# Patient Record
Sex: Female | Born: 1965 | Race: White | Hispanic: No | Marital: Married | Smoking: Never smoker
Health system: Southern US, Community
[De-identification: ages and names within clinical notes are randomized; demographics above are authoritative.]

## PROBLEM LIST (undated history)

## (undated) DIAGNOSIS — K869 Disease of pancreas, unspecified: Secondary | ICD-10-CM

## (undated) DIAGNOSIS — E041 Nontoxic single thyroid nodule: Secondary | ICD-10-CM

## (undated) DIAGNOSIS — T7840XA Allergy, unspecified, initial encounter: Secondary | ICD-10-CM

## (undated) DIAGNOSIS — I499 Cardiac arrhythmia, unspecified: Secondary | ICD-10-CM

## (undated) DIAGNOSIS — D649 Anemia, unspecified: Secondary | ICD-10-CM

## (undated) DIAGNOSIS — E785 Hyperlipidemia, unspecified: Secondary | ICD-10-CM

## (undated) DIAGNOSIS — D759 Disease of blood and blood-forming organs, unspecified: Secondary | ICD-10-CM

## (undated) HISTORY — DX: Allergy, unspecified, initial encounter: T78.40XA

## (undated) HISTORY — PX: DILATION AND CURETTAGE OF UTERUS: SHX78

## (undated) HISTORY — DX: Hyperlipidemia, unspecified: E78.5

## (undated) HISTORY — PX: WISDOM TOOTH EXTRACTION: SHX21

## (undated) HISTORY — DX: Cardiac arrhythmia, unspecified: I49.9

## (undated) HISTORY — DX: Disease of pancreas, unspecified: K86.9

## (undated) HISTORY — DX: Nontoxic single thyroid nodule: E04.1

## (undated) HISTORY — PX: COLONOSCOPY: SHX174

---

## 1997-09-14 ENCOUNTER — Encounter: Admission: RE | Admit: 1997-09-14 | Discharge: 1997-12-13 | Payer: Self-pay | Admitting: Gynecology

## 1997-10-20 ENCOUNTER — Inpatient Hospital Stay (HOSPITAL_COMMUNITY): Admission: AD | Admit: 1997-10-20 | Discharge: 1997-10-20 | Payer: Self-pay | Admitting: Obstetrics and Gynecology

## 1997-11-05 ENCOUNTER — Inpatient Hospital Stay (HOSPITAL_COMMUNITY): Admission: AD | Admit: 1997-11-05 | Discharge: 1997-11-09 | Payer: Self-pay | Admitting: Obstetrics and Gynecology

## 1997-11-15 ENCOUNTER — Encounter (HOSPITAL_COMMUNITY): Admission: RE | Admit: 1997-11-15 | Discharge: 1998-02-13 | Payer: Self-pay | Admitting: Obstetrics and Gynecology

## 1997-12-12 ENCOUNTER — Other Ambulatory Visit: Admission: RE | Admit: 1997-12-12 | Discharge: 1997-12-12 | Payer: Self-pay | Admitting: Obstetrics and Gynecology

## 1998-11-14 ENCOUNTER — Ambulatory Visit (HOSPITAL_BASED_OUTPATIENT_CLINIC_OR_DEPARTMENT_OTHER): Admission: RE | Admit: 1998-11-14 | Discharge: 1998-11-14 | Payer: Self-pay | Admitting: General Surgery

## 1998-12-19 ENCOUNTER — Encounter (INDEPENDENT_AMBULATORY_CARE_PROVIDER_SITE_OTHER): Payer: Self-pay | Admitting: Specialist

## 1998-12-19 ENCOUNTER — Ambulatory Visit (HOSPITAL_COMMUNITY): Admission: RE | Admit: 1998-12-19 | Discharge: 1998-12-19 | Payer: Self-pay | Admitting: Gynecology

## 1998-12-29 ENCOUNTER — Other Ambulatory Visit: Admission: RE | Admit: 1998-12-29 | Discharge: 1998-12-29 | Payer: Self-pay | Admitting: Obstetrics and Gynecology

## 1999-01-22 ENCOUNTER — Encounter: Payer: Self-pay | Admitting: Obstetrics and Gynecology

## 1999-01-22 ENCOUNTER — Ambulatory Visit (HOSPITAL_COMMUNITY): Admission: RE | Admit: 1999-01-22 | Discharge: 1999-01-22 | Payer: Self-pay | Admitting: Obstetrics and Gynecology

## 1999-05-30 ENCOUNTER — Other Ambulatory Visit: Admission: RE | Admit: 1999-05-30 | Discharge: 1999-05-30 | Payer: Self-pay | Admitting: Gynecology

## 1999-12-14 ENCOUNTER — Inpatient Hospital Stay (HOSPITAL_COMMUNITY): Admission: AD | Admit: 1999-12-14 | Discharge: 1999-12-17 | Payer: Self-pay | Admitting: Gynecology

## 2000-01-18 ENCOUNTER — Other Ambulatory Visit: Admission: RE | Admit: 2000-01-18 | Discharge: 2000-01-18 | Payer: Self-pay | Admitting: Gynecology

## 2001-01-22 ENCOUNTER — Other Ambulatory Visit: Admission: RE | Admit: 2001-01-22 | Discharge: 2001-01-22 | Payer: Self-pay | Admitting: Gynecology

## 2002-03-03 ENCOUNTER — Other Ambulatory Visit: Admission: RE | Admit: 2002-03-03 | Discharge: 2002-03-03 | Payer: Self-pay | Admitting: Gynecology

## 2002-09-04 ENCOUNTER — Inpatient Hospital Stay (HOSPITAL_COMMUNITY): Admission: AD | Admit: 2002-09-04 | Discharge: 2002-09-04 | Payer: Self-pay | Admitting: Gynecology

## 2002-09-22 ENCOUNTER — Inpatient Hospital Stay (HOSPITAL_COMMUNITY): Admission: AD | Admit: 2002-09-22 | Discharge: 2002-09-24 | Payer: Self-pay | Admitting: Gynecology

## 2002-11-10 ENCOUNTER — Other Ambulatory Visit: Admission: RE | Admit: 2002-11-10 | Discharge: 2002-11-10 | Payer: Self-pay | Admitting: Gynecology

## 2003-03-21 ENCOUNTER — Emergency Department (HOSPITAL_COMMUNITY): Admission: AD | Admit: 2003-03-21 | Discharge: 2003-03-21 | Payer: Self-pay | Admitting: Family Medicine

## 2003-05-13 ENCOUNTER — Emergency Department (HOSPITAL_COMMUNITY): Admission: EM | Admit: 2003-05-13 | Discharge: 2003-05-13 | Payer: Self-pay | Admitting: Emergency Medicine

## 2003-05-20 ENCOUNTER — Ambulatory Visit (HOSPITAL_COMMUNITY): Admission: RE | Admit: 2003-05-20 | Discharge: 2003-05-20 | Payer: Self-pay | Admitting: *Deleted

## 2003-11-23 ENCOUNTER — Other Ambulatory Visit: Admission: RE | Admit: 2003-11-23 | Discharge: 2003-11-23 | Payer: Self-pay | Admitting: Gynecology

## 2004-08-25 ENCOUNTER — Emergency Department (HOSPITAL_COMMUNITY): Admission: EM | Admit: 2004-08-25 | Discharge: 2004-08-25 | Payer: Self-pay | Admitting: Family Medicine

## 2004-09-13 ENCOUNTER — Encounter: Admission: RE | Admit: 2004-09-13 | Discharge: 2004-09-13 | Payer: Self-pay | Admitting: Gynecology

## 2004-11-23 ENCOUNTER — Other Ambulatory Visit: Admission: RE | Admit: 2004-11-23 | Discharge: 2004-11-23 | Payer: Self-pay | Admitting: Gynecology

## 2005-10-24 ENCOUNTER — Encounter: Admission: RE | Admit: 2005-10-24 | Discharge: 2005-10-24 | Payer: Self-pay | Admitting: Gynecology

## 2005-11-25 ENCOUNTER — Other Ambulatory Visit: Admission: RE | Admit: 2005-11-25 | Discharge: 2005-11-25 | Payer: Self-pay | Admitting: Gynecology

## 2006-04-25 ENCOUNTER — Other Ambulatory Visit: Admission: RE | Admit: 2006-04-25 | Discharge: 2006-04-25 | Payer: Self-pay | Admitting: Gynecology

## 2006-10-28 ENCOUNTER — Encounter: Admission: RE | Admit: 2006-10-28 | Discharge: 2006-10-28 | Payer: Self-pay | Admitting: Gynecology

## 2006-11-27 ENCOUNTER — Other Ambulatory Visit: Admission: RE | Admit: 2006-11-27 | Discharge: 2006-11-27 | Payer: Self-pay | Admitting: Gynecology

## 2007-10-29 ENCOUNTER — Encounter: Admission: RE | Admit: 2007-10-29 | Discharge: 2007-10-29 | Payer: Self-pay | Admitting: Gynecology

## 2008-04-27 ENCOUNTER — Encounter: Admission: RE | Admit: 2008-04-27 | Discharge: 2008-04-27 | Payer: Self-pay | Admitting: Family Medicine

## 2008-11-28 ENCOUNTER — Encounter: Admission: RE | Admit: 2008-11-28 | Discharge: 2008-11-28 | Payer: Self-pay | Admitting: Gynecology

## 2010-04-12 ENCOUNTER — Ambulatory Visit: Payer: Self-pay | Admitting: Family Medicine

## 2010-05-07 ENCOUNTER — Ambulatory Visit (INDEPENDENT_AMBULATORY_CARE_PROVIDER_SITE_OTHER): Payer: BC Managed Care – PPO | Admitting: Family Medicine

## 2010-05-07 DIAGNOSIS — R5381 Other malaise: Secondary | ICD-10-CM

## 2010-05-07 DIAGNOSIS — Z0000 Encounter for general adult medical examination without abnormal findings: Secondary | ICD-10-CM

## 2010-06-29 NOTE — Discharge Summary (Signed)
   NAME:  Deborah Meadows, LINE                          ACCOUNT NO.:  [id number]   MEDICAL RECORD NO.:  [id number]                   PATIENT TYPE:  INP   LOCATION:  9129                                 FACILITY:  [facility]   PHYSICIAN:  Juan H. Lily Peer, M.D.             DATE OF BIRTH:  December 06, 1965   DATE OF ADMISSION:  09/22/2002  DATE OF DISCHARGE:  09/24/2002                                 DISCHARGE SUMMARY   DISCHARGE DIAGNOSES:  1. Intrauterine pregnancy at 39 weeks, delivered.  2. History of prior cesarean section followed by successful vaginal birth     after cesarean section.  3. Status post vacuum-assisted vaginal delivery.  4. Positive group B strep.   HISTORY:  This is a 36-years-of-age female gravida 6 para 2 aborta 3 with an  EDC of September 26, 2002.  Prenatal course had been complicated by advanced  maternal age.  The patient declined amniocentesis.  Also with positive group  B strep, history of prior cesarean section; however, followed by successful  vaginal birth after cesarean section.   HOSPITAL COURSE:  On September 22, 2002 the patient presented at 39+ weeks with  rupture of membranes, was begun on IV clindamycin.  Subsequently, on September 22, 2002 the patient underwent a vacuum-assisted vaginal delivery secondary  to poor maternal pushing efforts/anxiety and underwent the delivery of female,  Apgars of 9 and 9, weight of 7 pounds 14 ounces.  There was a midline  episiotomy, third degree vaginal/perineal tear which was repaired.  It was a  successful vaginal birth after cesarean section.  Postpartum the patient  remained afebrile, voiding, in stable condition.  She was discharged to home  on September 24, 2002 in satisfactory condition and given [facility]  postpartum instructions/postpartum booklet.   ACCESSORY CLINICAL FINDINGS/LABORATORY DATA:  The patient is O positive,  rubella immune.  On September 23, 2002 hemoglobin was 9.9.   DISPOSITION:  The patient was  discharged to home.  Informed to return to  office in six weeks.  If had any problems prior to that time to be seen in  the office.  Given Motrin p.r.n. pain.     Susa Loffler, P.A.                    Juan H. Lily Peer, M.D.    TSG/MEDQ  D:  10/22/2002  T:  10/22/2002  Job:  [id number]

## 2010-06-29 NOTE — Op Note (Signed)
[facility]  Patient:    Deborah Meadows                        MRN: [id number] Proc. Date: 12/19/98 Adm. Date:  [id number] Attending:  Tonye Royalty                           Operative Report  PREOPERATIVE DIAGNOSIS:       First trimester missed abortion.  Recurrent first  trimester pregnancy losses.  POSTOPERATIVE DIAGNOSIS:      First trimester missed abortion.  Recurrent first  trimester pregnancy losses.  OPERATION:                    First trimester D&E.  SURGEON:                      Juan H. Lily Peer, M.D.  ASSISTANT:  ANESTHESIA:                   MAC and paracervical block with 2% Xylocaine with  1:100,000 epinephrine.  ESTIMATED BLOOD LOSS:  INDICATIONS:                  A 45 year old with first trimester missed abortion.  DESCRIPTION OF PROCEDURE:     After the patient was adequately counseled, she was taken to the operating room where after MAC anesthesia was placed, she was placed in the low lithotomy position.  The vagina and perineum were prepped and draped in the usual sterile fashion.  A red rubber Roxan Hockey was utilized to empty the bladder contents for approximately 50 cc.  An intervaginal speculum was inserted for visualization of the cervix.  After this, 2% Xylocaine with 1:100,000 epinephrine was infiltrated into the cervical stroma at the 2, 4, 8, and 10 position for approximately 10 cc.  The cervix required minimal dilatation and an 8 mm curet as introduced into the intrauterine cavity.  Of note, a single tooth tenaculum was  placed on the anterior cervical lip for traction during the D&E.  A moderate amount of tissue was obtained.  The suction curet was interchanged with a blunt curet o completely evacuate the intrauterine cavity.  An alloquot of the products of conception was submitted for chromosomal studies and the remainder to the pathologist here at [facility].  The patient  was given 10 units of Pitocin in 500 cc of Ringers lactate.  She did receive a gram of Ancef prophylactically and 30 mg of Toradol were given IV en route to the recovery room. The single tooth tenaculum was removed.  The patient tolerated the procedure well. Estimated blood loss was less than 75 cc and IV fluid was 800 cc and the patients blood type is O positive. DD:  12/19/98 TD:  12/20/98 Job: [id number]

## 2010-06-29 NOTE — Discharge Summary (Signed)
[facility] [place]  Patient:    Deborah Meadows, Deborah Meadows                       MRN: [id number] Adm. Date:  [id number] Disc. Date: [id number] Attending:  Douglass Rivers Dictator:   Antony Contras, [facility]                           Discharge Summary  DISCHARGE DIAGNOSES:          1. Intrauterine pregnancy at term.                               2. History of previous low cervical transverse                                  cesarean section.                               3. Positive group B strep status.  PROCEDURES:                   Mityvac-assisted vaginal delivery of viable                               infant over midline episiotomy with third                               degree laceration.  HISTORY OF PRESENT ILLNESS:   The patient is a 45 year old, gravida 5, para 1-0-3-1, with an LMP of March 09, 1999, [facility] December 14, 1999.  Prenatal risk factors included advanced maternal age, history of previous cesarean section, history of SABs x 3.  PRENATAL LABORATORY DATA:     Blood type O positive, antibody screen negative. RPR, HBsAg, HIV nonreactive.  Rubella immune.  MS-AFP within normal limits. GBS is positive.  HOSPITAL COURSE:              The patient had elected a trial of labor and was admitted on December 14, 1999, with a history of regular contractions and was found to be at complete dilatation with a +2 to +3 station.  She was delivered per Mityvac assistance over a midline episiotomy with third degree laceration, Apgars 8/9, female infant weighing 8 pounds 1 ounce.  Postpartum course was uncomplicated.  She remained afebrile.  She had no difficulty voiding and was able to discharge on her second postpartum day in satisfactory condition.  LABORATORY DATA:              CBC:  Hematocrit was 25.0, hemoglobin 8.7, WBC 11.6, and platelets 172.  DISPOSITION/FOLLOWUP:         Follow up in six weeks.  DISCHARGE MEDICATIONS:        Continue prenatal vitamins and  iron.  Motrin and Tylox for pain. DD:  01/07/00 TD:  01/07/00 Job: [id number]

## 2010-06-29 NOTE — H&P (Signed)
[facility] [place]  Patient:    Deborah Meadows, Deborah Meadows                       MRN: [id number] Adm. Date:  [id number] Attending:  Douglass Rivers                         History and Physical  CHIEF COMPLAINT:              Labor, term pregnancy.  HISTORY OF PRESENT ILLNESS:   This 45 year old G 5, P 1, AB 3 female at term gestation with a gross rupture of membranes and regular uterine contractions, admitted at 4.0 cm dilatation.  The patient has a history of a prior low transverse cervical cesarean section, and has been counseled for a trial of labor, and elects for a trial of labor, accepting the risks.  PRENATAL COURSE:              Noted for positive beta Streptococcus carrier.  PAST MEDICAL HISTORY:         None.  PAST SURGICAL HISTORY:        Cesarean section, low transverse cervical.  ALLERGIES:                    PENICILLIN.  REVIEW OF SYSTEMS:            Noncontributory.  FAMILY HISTORY:               Noncontributory.  SOCIAL HISTORY:               No cigarette or alcohol use.  PHYSICAL EXAMINATION:  VITAL SIGNS:                  Afebrile, vital signs are stable.  HEENT:                        Normal.  LUNGS:                        Clear.  CARDIAC:                      A regular rate, no rubs, murmurs, or gallops.  ABDOMEN:                      Benign, noting a gravid vertex fetus.  Reactive fetal tracing.  Contractions every two minutes.  PELVIC:                       Cervix complete, complete, +2 to +3 station.  ASSESSMENT:                   A 45 year old gravida 5, para 1, abortion 3,                               female with term gestation, admitted in labor,                               progressed without difficulty, now complete,                               complete, with +2 to +  3 station, with a                               reactive fetal tracing, regular contractions                               every two minutes.  PLAN:                          The patient is a beta Streptococcus positive carrier, and has received clindamycin due to her allergy to PENICILLIN. DD:  12/15/99 TD:  12/16/99 Job: [id number]

## 2010-10-29 ENCOUNTER — Encounter: Payer: Self-pay | Admitting: Medical

## 2010-10-29 ENCOUNTER — Ambulatory Visit (INDEPENDENT_AMBULATORY_CARE_PROVIDER_SITE_OTHER): Payer: BC Managed Care – PPO | Admitting: Medical

## 2010-10-29 VITALS — BP 118/80 | HR 78 | Temp 98.3°F | Resp 18 | Ht 60.0 in | Wt 123.0 lb

## 2010-10-29 DIAGNOSIS — M7989 Other specified soft tissue disorders: Secondary | ICD-10-CM

## 2010-10-29 DIAGNOSIS — M79644 Pain in right finger(s): Secondary | ICD-10-CM

## 2010-10-29 DIAGNOSIS — M79609 Pain in unspecified limb: Secondary | ICD-10-CM

## 2010-10-29 LAB — CBC
HCT: 36.6 % (ref 36.0–46.0)
Hemoglobin: 12.4 g/dL (ref 12.0–15.0)
MCV: 96.6 fL (ref 78.0–100.0)
RDW: 12.4 % (ref 11.5–15.5)
WBC: 5.1 10*3/uL (ref 4.0–10.5)

## 2010-10-29 MED ORDER — CEPHALEXIN 500 MG PO CAPS: ORAL_CAPSULE | ORAL | Status: DC

## 2010-10-29 NOTE — Patient Instructions (Signed)
Take 3-4 Ibuprofen OTC by mouth every 6-8 hours for pain and inflammation, elevate the finger/hand, apply ice pack for 15-20 minutes three times daily.  Avoid injuring the finger and rest.    We will check some lab values.    If redness, pain and swelling worsens, then begin Keflex.

## 2010-10-29 NOTE — Progress Notes (Signed)
Subjective:   HPI  Deborah Meadows is a 45 y.o. female who presents for 3-4 day hx/o swollen, painful right thumb.  She denies prior similar episodes.  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  She denies recent injury, denies biting her nails, denies trauma or break in the skin . She notes the pain in her thumb awoke her from sleep Saturday 2 days ago, and it has continued to be warm, swollen, painful, throbbing like it has "temperature" in it.  Denies drainage.  It hurts for even touching another object/brushing against an object.  Denies alcohol use and doesn't eat lots of protein.  No other aggravating or relieving factors.  No other c/o.  The following portions of the patient's history were reviewed and updated as appropriate: allergies, current medications, past family history, past medical history, past social history, past surgical history and problem list.  Past Medical History  Diagnosis Date  . Irregular heart beat     Review of Systems Constitutional: denies fever, chills, sweats Dermatology: denies rash Cardiology: denies chest pain, palpitations, edema Respiratory: denies cough, shortness of breath Gastroenterology: denies abdominal pain, nausea, vomiting, diarrhea, constipation Hematology: denies bleeding or bruising problems Ophthalmology: denies vision changes Urology: denies dysuria, difficulty urinating, hematuria, urinary frequency, urgency Neurology: no weakness, tingling, numbness      Objective:   Physical Exam  General appearance: alert, no distress, WD/WN, white female Skin: warmth of the right thumb volar surface, but no fluctuance, no induration, no obvious felon, no drainage.  Small crusted round 2mm lesion on volar thumb surface at MCP joint Musculoskeletal: right thumb quite tender distal phalanx, generalized erythema, swollen, but normal ROM,  otherwise bilat fingers and hands nontender, no swelling, no obvious deformity Pulses: UE pulses 2+ symmetric, normal cap refill Neurological: normal strength and sensation of fingers and hands  Assessment :    Encounter Diagnoses  Name Primary?  . Pain in finger of right hand Yes  . Swelling of finger      Plan:  Etiology seems to be gout/inflammatory arthritis, but can entirely rule out infection.  Since she has no prior similar hx/o, we will get some labs today to help evaluate for gout vs other, and will have her rest, use ice, Ibuprofen 4 tablets of the 200mg  OTC every 6-8 hours, and avoid injuring the finger.  Gave note for work.    Discussed signs of infection or worsening infection.  If any symptoms suggest worsening infection, she will begin Keflex.  We will hopefully tomorrow with lab results.

## 2010-10-30 ENCOUNTER — Ambulatory Visit (INDEPENDENT_AMBULATORY_CARE_PROVIDER_SITE_OTHER): Payer: BC Managed Care – PPO | Admitting: Medical

## 2010-10-30 ENCOUNTER — Encounter: Payer: Self-pay | Admitting: Medical

## 2010-10-30 ENCOUNTER — Other Ambulatory Visit: Payer: Self-pay | Admitting: Medical

## 2010-10-30 ENCOUNTER — Encounter: Payer: Self-pay | Admitting: Family Medicine

## 2010-10-30 VITALS — BP 120/80 | HR 72 | Temp 98.8°F | Resp 18

## 2010-10-30 DIAGNOSIS — M79646 Pain in unspecified finger(s): Secondary | ICD-10-CM

## 2010-10-30 DIAGNOSIS — M79609 Pain in unspecified limb: Secondary | ICD-10-CM

## 2010-10-30 DIAGNOSIS — IMO0002 Reserved for concepts with insufficient information to code with codable children: Secondary | ICD-10-CM

## 2010-10-30 LAB — SEDIMENTATION RATE

## 2010-10-30 MED ORDER — DOXYCYCLINE HYCLATE 100 MG PO TABS: 100.0000 mg | ORAL_TABLET | Freq: Two times a day (BID) | ORAL | Status: AC

## 2010-10-30 MED ORDER — OXYCODONE-ACETAMINOPHEN 5-325 MG PO TABS: 1.0000 | ORAL_TABLET | Freq: Four times a day (QID) | ORAL | Status: DC | PRN

## 2010-10-30 NOTE — Progress Notes (Signed)
Subjective:   HPI  Deborah Meadows is a 45 y.o. female who presents for recheck on painful right thumb.   I saw her just yesterday for same problem.  She had been having painful swollen but not red thumb x 3-4 days.  Given her work history and symptoms, exam suggested inflammatory joint with little sign of infection yesterday.  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  She denies recent injury, denies trauma or break in the skin.  She does now recall biting her nails at times.  Since yesterday the pain has gotten unbearable, she sees color changes and possible "fever" and pus in the finger now.   No other aggravating or relieving factors.  No other c/o.  The following portions of the patient's history were reviewed and updated as appropriate: allergies, current medications, past family history, past medical history, past social history, past surgical history and problem list.  Past Medical History  Diagnosis Date  . Irregular heart beat     Review of Systems Constitutional: denies fever, chills, sweats Dermatology: denies rash Cardiology: denies chest pain, palpitations, edema Respiratory: denies cough, shortness of breath Gastroenterology: denies abdominal pain, nausea, vomiting Neurology: no weakness, tingling, numbness      Objective:   Physical Exam  General appearance: alert, no distress, WD/WN, white female Skin: warmth of the right thumb volar surface, area of whitish coloration suggestive of pus pocket on the medial nail bed, otherwise pink to erythematous color.  No induration, no obvious felon, no drainage.   Musculoskeletal: right thumb quite tender distal phalanx, generalized erythema, swollen, but normal ROM, otherwise bilat fingers and hands nontender Pulses: UE pulses 2+ symmetric, normal cap refill Neurological: normal strength and sensation of fingers and  hands  Assessment :    Encounter Diagnoses  Name Primary?  . Paronychia Yes  . Finger pain      Plan:   Dr. Susann Givens, supervising physician, also examined the finger, and we together with patient's consent decided to perform I&D.  Prepped in usual sterile fashion, used digital block for anesthesia with 1% lidocaine without epi, but this didn't seem to help the distal finger.  Thus, local anesthesia of distal thumb also applied.  Used #11 blade to incise with 1-1.5 cc of pus expressed.  Cleaned wound and dressed with sterile dressing.    Discussed wound care, dressing changes BID, can use Percocet prn for pain, begin Doxycycline, and if not improving in 2-3 days, recheck.

## 2010-10-30 NOTE — Patient Instructions (Signed)
Keep cleaned and bandaged until it is healing.  By lunch time tomorrow, begin warm soap and water or warm salt water soaks.  Begin Keflex today.  Use Percocet as needed for pain.  Use Aleve BID for the next few days for pain and inflammation.  Alternate by 4 hours with the Percocet.

## 2010-10-31 ENCOUNTER — Other Ambulatory Visit: Payer: Self-pay | Admitting: *Deleted

## 2010-11-01 ENCOUNTER — Encounter: Payer: Self-pay | Admitting: Family Medicine

## 2010-11-01 ENCOUNTER — Ambulatory Visit (INDEPENDENT_AMBULATORY_CARE_PROVIDER_SITE_OTHER): Payer: BC Managed Care – PPO | Admitting: Family Medicine

## 2010-11-01 VITALS — BP 100/64 | HR 64 | Temp 98.5°F | Ht 63.0 in | Wt 123.0 lb

## 2010-11-01 DIAGNOSIS — IMO0002 Reserved for concepts with insufficient information to code with codable children: Secondary | ICD-10-CM

## 2010-11-01 DIAGNOSIS — Z23 Encounter for immunization: Secondary | ICD-10-CM

## 2010-11-01 NOTE — Patient Instructions (Signed)
Paronychia (Felon, Whitlow) Paronychia is an inflammatory reaction involving the folds of the skin surrounding the fingernail. This is commonly caused by an infection in the skin around a nail. The most common cause of paronychia is frequent wetting of the hands (as seen with bartenders, food servers, nurses or others who wet their hands). This makes the skin around the fingernail susceptible to infection by bacteria (germs) or fungus. Other predisposing factors are:  Aggressive manicuring.   Nail biting.   Thumbsucking.  The most common cause is a staphylococcal (a type of germ) infection, or a fungal (Candida) infection. When caused by a germ, it usually comes on suddenly with redness, swelling, pus and is often painful. It may get under the nail and form an abscess (collection of pus), or form an abscess around the nail. If the nail itself is infected with a fungus, the treatment is usually prolonged and may require oral medicine for up to one year. Your caregiver will determine the length of time treatment is required. The paronychia caused by bacteria (germs) may largely be avoided by not pulling on hangnails or picking at cuticles. When the infection occurs at the tips of the finger it is called felon. When the cause of paronychia is from the herpes simplex virus (HSV) it is called herpetic whitlow. TREATMENT  When an abscess is present treatment is often incision and drainage. This means that the abscess must be cut open so the pus can get out. When this is done, the following home care instructions should be followed.  HOME CARE INSTRUCTIONS  It is important to keep the affected fingers very dry. Rubber or plastic gloves over cotton gloves should be used whenever the hand must be placed in water.   Keep wound clean, dry and dressed as suggested by your caregiver between warm soaks or warm compresses.   Soak in warm water for fifteen to twenty minutes three to four times per day for  bacterial infections. Fungal infections are very difficult to treat, so often require treatment for long periods of time.   For bacterial (germ) infections take antibiotics (medicine which kill germs) as directed and finish the prescription, even if the problem appears to be solved before the medicine is gone.   Only take over-the-counter or prescription medicines for pain, discomfort, or fever as directed by your caregiver.  SEEK IMMEDIATE MEDICAL CARE IF :  There is redness, swelling, or increasing pain in the wound.   Pus is coming from wound.   An unexplained oral temperature above 101 develops.   You notice a foul smell coming from the wound or dressing.  Document Released: 07/24/2000 Document Re-Released: 04/26/2008 [facility] Patient Information 2011 [place], [place].

## 2010-11-01 NOTE — Progress Notes (Signed)
Patient presents for f/u on paronychia.  She had 1-1.5 cc of pus drained 2 days ago.  Hasn't been doing any warm soaks, no further drainage.  Feels like pus is reaccumulating.  Still in a lot of pain.  Compliant with taking Doxycycline.  Denies fevers.  Presents for repeat I&D of paronychia.  States she didn't get good relief from digital block, prefers just local block with today's procedure  Past Medical History  Diagnosis Date  . Irregular heart beat     Past Surgical History  Procedure Date  . Cesarean section 1999    History   Social History  . Marital Status: Married    Spouse Name: N/A    Number of Children: N/A  . Years of Education: N/A   Occupational History  . Not on file.   Social History Main Topics  . Smoking status: Never Smoker   . Smokeless tobacco: Not on file  . Alcohol Use: 0.5 oz/week    1 drink(s) per week     1 glass per evening.  . Drug Use: No  . Sexually Active: Not on file   Other Topics Concern  . Not on file   Social History Narrative  . No narrative on file   Current outpatient prescriptions:doxycycline (VIBRA-TABS) 100 MG tablet, Take 1 tablet (100 mg total) by mouth 2 (two) times daily., Disp: 20 tablet, Rfl: 0;  oxyCODONE-acetaminophen (ROXICET) 5-325 MG per tablet, Take 1 tablet by mouth every 6 (six) hours as needed for pain., Disp: 20 tablet, Rfl: 0  Allergies  Allergen Reactions  . Penicillins Hives and Itching   ROS: denies fever, nausea, vomiting, rash or other concerns  PHYSICAL EXAM: BP 100/64  Pulse 64  Temp(Src) 98.5 F (36.9 C) (Oral)  Ht 5\' 3"  (1.6 m)  Wt 123 lb (55.792 kg)  BMI 21.79 kg/m2  LMP 10/07/2010  R thumb--ulnar aspect of R thumb very tender, with fluctuant, white appearing area.  Some STS of entire distal thumb.  No erythema/warmth/streaks  Procedure: Area prepped with betadine swabs x 3. Anesthetized with 2% lidocaine without epinephrine.  11 blade was then used to incise paronychia.  Drained  approximately 1/2 cc of purulent fluid.  Patient tolerated procedure well.  ASSESSMENT/PLAN: 1. Paronychia  Incise and drain finger abscess  2. Need for prophylactic vaccination and inoculation against influenza  Flu vaccine greater than or equal to 3yo preservative free IM   Warm soaks. Continue ABX F/u if worsening. Will contact with culture results--still pending

## 2010-11-02 LAB — WOUND CULTURE
Gram Stain: NONE SEEN
Gram Stain: NONE SEEN
Gram Stain: NONE SEEN

## 2010-11-05 ENCOUNTER — Telehealth: Payer: Self-pay | Admitting: Family Medicine

## 2010-11-05 NOTE — Telephone Encounter (Signed)
Spoke with patient, she states that her finger has shown improvement. She has been draining any pus that was left in her wound, but currently there is not any. She states that her finger in tingly, tender and sore from where she was cut to drain but feels that she is definetely improving. Instructed pt to call if anything changes or her finger worsens. Pt verbalized understanding.

## 2010-11-05 NOTE — Telephone Encounter (Signed)
Advise pt--had to be re-cultured (re-inoculated) which caused delay in getting results.  Results show MICROAEROPHILIC STREPTOCOCCI.  It is NOT MRSA (not staph, but strep).  The doxycycline should be treating this.  Ensure that she is having improvement in her finger infection

## 2010-11-07 ENCOUNTER — Telehealth: Payer: Self-pay | Admitting: Family Medicine

## 2010-11-07 NOTE — Telephone Encounter (Signed)
Deborah Meadows wants you to call her and discuss her finger and antibiotics.

## 2010-11-08 NOTE — Telephone Encounter (Signed)
Discussed with patient--she had questions regarding type of bacteria, whether or not it was flesh-eating.  She continued to get purulent drainage after warm soaks for a few days, but no further pus over the last 2 days.  Denies fevers.  Still somewhat tender to touch in area where she was lanced twice, but no increase in swelling or redevelopment of abscess.  She is to complete the doxycycline, and f/u next week if ongoing problems.  All questions were answered

## 2010-11-14 ENCOUNTER — Other Ambulatory Visit: Payer: Self-pay | Admitting: Women's Health

## 2010-11-14 DIAGNOSIS — Z1231 Encounter for screening mammogram for malignant neoplasm of breast: Secondary | ICD-10-CM

## 2010-12-03 ENCOUNTER — Ambulatory Visit
Admission: RE | Admit: 2010-12-03 | Discharge: 2010-12-03 | Disposition: A | Discharge status: Home / Self Care | Payer: BC Managed Care – PPO | Source: Ambulatory Visit | Attending: Women's Health | Admitting: Women's Health

## 2010-12-03 DIAGNOSIS — Z1231 Encounter for screening mammogram for malignant neoplasm of breast: Secondary | ICD-10-CM

## 2011-04-11 ENCOUNTER — Telehealth: Payer: Self-pay | Admitting: *Deleted

## 2011-04-11 ENCOUNTER — Encounter: Payer: Self-pay | Admitting: Family Medicine

## 2011-04-11 ENCOUNTER — Ambulatory Visit (INDEPENDENT_AMBULATORY_CARE_PROVIDER_SITE_OTHER): Payer: BC Managed Care – PPO | Admitting: Family Medicine

## 2011-04-11 VITALS — BP 108/72 | HR 68 | Temp 99.2°F | Ht 63.0 in | Wt 123.0 lb

## 2011-04-11 DIAGNOSIS — J069 Acute upper respiratory infection, unspecified: Secondary | ICD-10-CM

## 2011-04-11 NOTE — Patient Instructions (Signed)
Decongestants (ie sudafed) will help with sinus pressure and pain, and ear discomfort Guaifenesin (ingredient in Mucinex and Robitussin) will help keep the mucus/phlegm thin.  Use this only if having very thick mucus, or a lot of cough from postnasal drip.  If you use the Mucinex D form of mucinex, then do NOT take a separate decongestant (it contains a decongestant). Start doing sinus rinses or using neti-pot.  Use once or twice daily  Call Monday or Tuesday if you are getting worse, for antibiotic prescription

## 2011-04-11 NOTE — Telephone Encounter (Signed)
Not seen since September, for different issue. Needs OV for consideration of ABX. Keep appt

## 2011-04-11 NOTE — Telephone Encounter (Signed)
Called patient and left message for her letting her know that Dr.Knapp would like her to keep appt this afternnon.

## 2011-04-11 NOTE — Telephone Encounter (Signed)
Patient called and wanted to know if you would call something in for her for a sinus infection? I offered her an appt this afternoon, she took the 4pm but still wanted to know if you would call her in a Zpak instead of coming in. Thanks. [employer] [place].

## 2011-04-11 NOTE — Progress Notes (Signed)
Chief complaint: sinus pain/pressure, facial pain, sinus HA, ear pain and teeth even hurt since yesterday am  HPI:  Symptoms began 2-3 days ago.  She was away with a sick friend.  Today if felt like there was a truck on her head.  All of her sinuses hurt, her teeth hurt, her glands hurt and ears hurt.  She hasn't taking any OTC meds. Mucus has been clear sometimes, green sometimes.  Only slight cough.  Past Medical History  Diagnosis Date  . Irregular heart beat    Past Surgical History  Procedure Date  . Cesarean section 1999   No current outpatient prescriptions on file prior to visit.   Allergies  Allergen Reactions  . Penicillins Hives and Itching   ROS: Denies fevers, occasional slight chills.  Denies nausea, vomiting, diarrhea, skin rash, chest pain, shortness of breath  PHYSICAL EXAM: BP 108/72  Pulse 68  Temp(Src) 99.2 F (37.3 C) (Oral)  Ht 5\' 3"  (1.6 m)  Wt 123 lb (55.792 kg)  BMI 21.79 kg/m2  LMP 03/18/2011 Well developed, pleasant female in no distress, sniffing HEENT:  PERRL, EOMI, conjunctiva clear. Nasal mucosa mild-mod edematous, no purulence or erythema. Mildly tender x 4 sinuses, maxillary>frontal.  Tm's and EAC's normal OP clear Neck: no lymphadenopathy, thyromegaly or mass Heart: regular rate and rhythm without murmur Lungs: clear bilaterally Skin: no rash  ASSESSMENT/PLAN:  1. URI (upper respiratory infection)    Decongestants (ie sudafed) will help with sinus pressure and pain, and ear discomfort Guaifenesin (ingredient in Mucinex and Robitussin) will help keep the mucus/phlegm thin.  Use this only if having very thick mucus, or a lot of cough from postnasal drip.  If you use the Mucinex D form of mucinex, then do NOT take a separate decongestant (it contains a decongestant). Start doing sinus rinses or using neti-pot.  Use once or twice daily  Call Monday for antibiotics if worsening symptoms

## 2012-02-26 ENCOUNTER — Other Ambulatory Visit: Payer: Self-pay | Admitting: Family Medicine

## 2012-02-26 DIAGNOSIS — Z1231 Encounter for screening mammogram for malignant neoplasm of breast: Secondary | ICD-10-CM

## 2012-03-13 ENCOUNTER — Encounter: Payer: Self-pay | Admitting: Women's Health

## 2012-03-13 ENCOUNTER — Telehealth: Payer: Self-pay | Admitting: *Deleted

## 2012-03-13 ENCOUNTER — Other Ambulatory Visit (HOSPITAL_COMMUNITY)
Admission: RE | Admit: 2012-03-13 | Discharge: 2012-03-13 | Disposition: A | Discharge status: Home / Self Care | Payer: BC Managed Care – PPO | Source: Ambulatory Visit | Attending: Women's Health | Admitting: Women's Health

## 2012-03-13 ENCOUNTER — Ambulatory Visit (INDEPENDENT_AMBULATORY_CARE_PROVIDER_SITE_OTHER): Payer: BC Managed Care – PPO | Admitting: Women's Health

## 2012-03-13 VITALS — BP 126/76 | Ht 63.0 in | Wt 123.0 lb

## 2012-03-13 DIAGNOSIS — Z01419 Encounter for gynecological examination (general) (routine) without abnormal findings: Secondary | ICD-10-CM

## 2012-03-13 DIAGNOSIS — J329 Chronic sinusitis, unspecified: Secondary | ICD-10-CM | POA: Insufficient documentation

## 2012-03-13 DIAGNOSIS — Z833 Family history of diabetes mellitus: Secondary | ICD-10-CM

## 2012-03-13 DIAGNOSIS — E079 Disorder of thyroid, unspecified: Secondary | ICD-10-CM

## 2012-03-13 DIAGNOSIS — Z1151 Encounter for screening for human papillomavirus (HPV): Secondary | ICD-10-CM | POA: Insufficient documentation

## 2012-03-13 DIAGNOSIS — Z13220 Encounter for screening for lipoid disorders: Secondary | ICD-10-CM

## 2012-03-13 LAB — CBC WITH DIFFERENTIAL/PLATELET
Basophils Absolute: 0 10*3/uL (ref 0.0–0.1)
Basophils Relative: 0 % (ref 0–1)
Lymphocytes Relative: 18 % (ref 12–46)
MCHC: 34.1 g/dL (ref 30.0–36.0)
Monocytes Absolute: 0.6 10*3/uL (ref 0.1–1.0)
Neutro Abs: 3.9 10*3/uL (ref 1.7–7.7)
Neutrophils Relative %: 70 % (ref 43–77)
Platelets: 201 10*3/uL (ref 150–400)
RDW: 12.8 % (ref 11.5–15.5)
WBC: 5.6 10*3/uL (ref 4.0–10.5)

## 2012-03-13 LAB — COMPREHENSIVE METABOLIC PANEL
ALT: 14 U/L (ref 0–35)
Albumin: 4.4 g/dL (ref 3.5–5.2)
Alkaline Phosphatase: 47 U/L (ref 39–117)
CO2: 24 mEq/L (ref 19–32)
Potassium: 4.4 mEq/L (ref 3.5–5.3)
Sodium: 134 mEq/L — ABNORMAL LOW (ref 135–145)
Total Bilirubin: 0.8 mg/dL (ref 0.3–1.2)
Total Protein: 6.7 g/dL (ref 6.0–8.3)

## 2012-03-13 LAB — LIPID PANEL
Cholesterol: 218 mg/dL — ABNORMAL HIGH (ref 0–200)
Triglycerides: 58 mg/dL (ref <150)
VLDL: 12 mg/dL (ref 0–40)

## 2012-03-13 LAB — URINALYSIS W MICROSCOPIC + REFLEX CULTURE
Bilirubin Urine: NEGATIVE
Ketones, ur: NEGATIVE mg/dL
Nitrite: NEGATIVE
Specific Gravity, Urine: 1.01 (ref 1.005–1.030)
Urobilinogen, UA: 0.2 mg/dL (ref 0.0–1.0)
pH: 6 (ref 5.0–8.0)

## 2012-03-13 LAB — TSH: TSH: 2.808 u[IU]/mL (ref 0.350–4.500)

## 2012-03-13 MED ORDER — AZITHROMYCIN 250 MG PO TABS: 250.0000 mg | ORAL_TABLET | Freq: Every day | ORAL | Status: DC

## 2012-03-13 NOTE — Telephone Encounter (Signed)
Yes, take 2 pills first day one/ day there after. Patient was instructed on how to take at office visit also.

## 2012-03-13 NOTE — Addendum Note (Signed)
Addended by: Richardson Chiquito on: 03/13/2012 12:19 PM   Modules accepted: Orders

## 2012-03-13 NOTE — Telephone Encounter (Signed)
Pt informed with the below note. Pt has picked rx up.

## 2012-03-13 NOTE — Telephone Encounter (Signed)
Pharmacy called to clarfiy Rx for z-pack. Pharmacy said that directions are typically 2 pill first day then once daily after. Is this how pt should take Rx. Please advise

## 2012-03-13 NOTE — Patient Instructions (Signed)

## 2012-03-13 NOTE — Progress Notes (Signed)
Deborah Meadows Feb 27, 1965 [id number]    History:    The patient presents for annual exam.  Regular monthly cycle, not sexually active/husband vasectomy. Process of divorce, no infidelity. Complained of sinus infection, facial pressure, pain in teeth, green nasal drainage/ congestion, symptoms present 3 weeks. History of normal Paps and mammograms. Has had some labs at primary care that have been normal.    Past medical history, past surgical history, family history and social history were all reviewed and documented in the EPIC chart. 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  Britt 14, Ellis 12, Evan 9, all doing well. Sister diagnosed with thyroid cancer.   ROS:  A  ROS was performed and pertinent positives and negatives are included in the history.  Exam:  Filed Vitals:   03/13/12 1021  BP: 126/76    General appearance:  Normal Head/Neck:  Normal, without cervical or supraclavicular adenopathy. Thyroid:  Symmetrical, normal in size, without palpable masses or nodularity. Respiratory  Effort:  Normal  Auscultation:  Clear without wheezing or rhonchi Cardiovascular  Auscultation:  Regular rate, without rubs, murmurs or gallops  Edema/varicosities:  Not grossly evident Abdominal  Soft,nontender, without masses, guarding or rebound.  Liver/spleen:  No organomegaly noted  Hernia:  None appreciated  Skin  Inspection:  Grossly normal  Palpation:  Grossly normal Neurologic/psychiatric  Orientation:  Normal with appropriate conversation.  Mood/affect:  Normal  Genitourinary    Breasts: Examined lying and sitting.     Right: Without masses, retractions, discharge or axillary adenopathy.     Left: Without masses, retractions, discharge or axillary adenopathy.   Inguinal/mons:  Normal without inguinal adenopathy  External genitalia:  Normal  BUS/Urethra/Skene's glands:  Normal  Bladder:  Normal  Vagina:  Normal  Cervix:  Normal  Uterus:   normal in  size, shape and contour.  Midline and mobile  Adnexa/parametria:     Rt: Without masses or tenderness.   Lt: Without masses or tenderness.  Anus and perineum: Normal  Digital rectal exam: Normal sphincter tone without palpated masses or tenderness  Assessment/Plan:  47 y.o. M. WF G6 P3  for annual exam with complaint of sinus infection.  Normal GYN exam/vasectomy Sinus infection Situational stress/impending divorce  Plan: Z-Pak prescription, proper use, given and reviewed. SBE's, continue annual mammogram, calcium rich diet, vitamin D 1000 daily encouraged. Continue active lifestyle of running. CBC, glucose, lipid panel, TSH, UA, Pap. Last normal Pap 2008, new screening guidelines reviewed. Continue counseling as needed for impending divorce, if becomes sexually active encouraged condoms.    Harrington Challenger WHNP, 11:00 AM 03/13/2012

## 2012-03-19 ENCOUNTER — Ambulatory Visit
Admission: RE | Admit: 2012-03-19 | Discharge: 2012-03-19 | Disposition: A | Discharge status: Home / Self Care | Payer: BC Managed Care – PPO | Source: Ambulatory Visit | Attending: Family Medicine | Admitting: Family Medicine

## 2012-03-19 DIAGNOSIS — Z1231 Encounter for screening mammogram for malignant neoplasm of breast: Secondary | ICD-10-CM

## 2012-05-19 ENCOUNTER — Encounter: Payer: Self-pay | Admitting: Internal Medicine

## 2012-05-28 ENCOUNTER — Encounter: Payer: BC Managed Care – PPO | Admitting: Family Medicine

## 2012-11-02 ENCOUNTER — Encounter: Payer: Self-pay | Admitting: Family Medicine

## 2012-11-02 ENCOUNTER — Ambulatory Visit (INDEPENDENT_AMBULATORY_CARE_PROVIDER_SITE_OTHER): Payer: BC Managed Care – PPO | Admitting: Family Medicine

## 2012-11-02 VITALS — BP 128/74 | HR 64 | Ht 63.0 in | Wt 135.0 lb

## 2012-11-02 DIAGNOSIS — L259 Unspecified contact dermatitis, unspecified cause: Secondary | ICD-10-CM

## 2012-11-02 DIAGNOSIS — Z23 Encounter for immunization: Secondary | ICD-10-CM

## 2012-11-02 DIAGNOSIS — L719 Rosacea, unspecified: Secondary | ICD-10-CM

## 2012-11-02 DIAGNOSIS — Z0000 Encounter for general adult medical examination without abnormal findings: Secondary | ICD-10-CM

## 2012-11-02 DIAGNOSIS — L309 Dermatitis, unspecified: Secondary | ICD-10-CM | POA: Insufficient documentation

## 2012-11-02 LAB — POCT URINALYSIS DIPSTICK
Ketones, UA: NEGATIVE
Protein, UA: NEGATIVE
Spec Grav, UA: <=1.005
Urobilinogen, UA: NEGATIVE
pH, UA: 5

## 2012-11-02 NOTE — Patient Instructions (Addendum)
HEALTH MAINTENANCE RECOMMENDATIONS:  It is recommended that you get at least 30 minutes of aerobic exercise at least 5 days/week (for weight loss, you may need as much as 60-90 minutes). This can be any activity that gets your heart rate up. This can be divided in 10-15 minute intervals if needed, but try and build up your endurance at least once a week.  Weight bearing exercise is also recommended twice weekly.  Eat a healthy diet with lots of vegetables, fruits and fiber.  "Colorful" foods have a lot of vitamins (ie green vegetables, tomatoes, red peppers, etc).  Limit sweet tea, regular sodas and alcoholic beverages, all of which has a lot of calories and sugar.  Up to 1 alcoholic drink daily may be beneficial for women (unless trying to lose weight, watch sugars).  Drink a lot of water.  Calcium recommendations are 1200-1500 mg daily (1500 mg for postmenopausal women or women without ovaries), and vitamin D 1000 IU daily.  This should be obtained from diet and/or supplements (vitamins), and calcium should not be taken all at once, but in divided doses.  Monthly self breast exams and yearly mammograms for women over the age of 29 is recommended.  Sunscreen of at least SPF 30 should be used on all sun-exposed parts of the skin when outside between the hours of 10 am and 4 pm (not just when at beach or pool, but even with exercise, golf, tennis, and yard work!)  Use a sunscreen that says "broad spectrum" so it covers both UVA and UVB rays, and make sure to reapply every 1-2 hours.  Remember to change the batteries in your smoke detectors when changing your clock times in the spring and fall.  Use your seat belt every time you are in a car, and please drive safely and not be distracted with cell phones and texting while driving.  Try 1% hydrocortisone cream twice daily as needed for rash behind ears.  Call for prescription medication if this doesn't improve after 1-2 weeks of use.  Rosacea Rosacea  is a long-term (chronic) condition that affects the skin of the face (cheeks, nose, brow, and chin) and sometimes the eyes. Rosacea causes the blood vessels near the surface of the skin to enlarge, resulting in redness. This condition usually begins after age 54. It occurs most often in light-skinned women. Without treatment, rosacea tends to get worse over time. There is no cure for rosacea, but treatment can help control your symptoms. CAUSES  The cause is unknown. It is thought that some people may inherit a tendency to develop rosacea. Certain triggers can make your rosacea worse, including:  Hot baths.  Exercise.  Sunlight.  Very hot or cold temperatures.  Hot or spicy foods and drinks.  Drinking alcohol.  Stress.  Taking blood pressure medicine.  Long-term use of topical steroids on the face. SYMPTOMS   Redness of the face.  Red bumps or pimples on the face.  Red, enlarged nose (rhinophyma).  Blushing easily.  Red lines on the skin.  Irritated or burning feeling in the eyes.  Swollen eyelids. DIAGNOSIS  Your caregiver can usually tell what is wrong by asking about your symptoms and performing a physical exam. TREATMENT  Avoiding triggers is an important part of treatment. You will also need to see a skin specialist (dermatologist) who can develop a treatment plan for you. The goals of treatment are to control your condition and to improve the appearance of your skin. It may take  several weeks or months of treatment before you notice an improvement in your skin. Even after your skin improves, you will likely need to continue treatment to prevent your rosacea from coming back. Treatment methods may include:  Using sunscreen or sunblock daily to protect the skin.  Antibiotic medicine, such as metronidazole, applied directly to the skin.  Antibiotics taken by mouth. This is usually prescribed if you have eye problems from your rosacea.  Laser surgery to improve the  appearance of the skin. This surgery can reduce the appearance of red lines on the skin and can remove excess tissue from the nose to reduce its size. HOME CARE INSTRUCTIONS  Avoid things that seem to trigger your flare-ups.  If you are given antibiotics, take them as directed. Finish them even if you start to feel better.  Use a gentle facial cleanser that does not contain alcohol.  You may use a mild facial moisturizer.  Use a sunscreen or sunblock with SPF 30 or greater.  Wear a green-tinted foundation powder to conceal redness, if needed. Choose cosmetics that are noncomedogenic. This means they do not block your pores.  If your eyelids are affected, apply warm compresses to the eyelids. Do this up to 4 times a day or as directed by your caregiver. SEEK MEDICAL CARE IF:  Your skin problems get worse.  You feel depressed.  You lose your appetite.  You have trouble concentrating.  You have problems with your eyes, such as redness or itching. MAKE SURE YOU:  Understand these instructions.  Will watch your condition.  Will get help right away if you are not doing well or get worse. Document Released: 03/07/2004 Document Revised: 07/30/2011 Document Reviewed: 01/08/2011 [facility] Patient Information 2014 [place], [place].

## 2012-11-02 NOTE — Progress Notes (Signed)
Chief Complaint  Patient presents with  . Annual Exam    fasting annual exam. Patient did have labs 03/13/12 with GYN and results are in system. Has a rash near her mouth that she would you to look at.     Deborah Meadows is a 47 y.o. female who presents for a complete physical.  She has the following concerns:  Eczema behind her ears since she was in her 70's.  Currently flaring. Hasn't been using any topical medication recently.  She is also questioning the redness in her cheeks, worse after working out, certain foods, and drinking wine.  Denies acne.  She hasn't had any other problems, doing well overall.  She gave up running after a bout with plantar fasciitis, but continues to get regular exercise.  Immunization History  Administered Date(s) Administered  . Influenza Split 11/01/2010  . Influenza,inj,Quad PF,36+ Mos 11/02/2012   Last Pap smear: 02/2012 Last mammogram: 03/2012 Last colonoscopy: never; had sigmoidoscopy in her early 20's Last DEXA: never Dentist: at least once yearly Ophtho:  Recent, yearly Exercise: weights twice per week, plus rowing machine, spin classes (3-4 days/week)  Asking about dexa (wanting)--doesn't have risk factors.  Discussed what these risk factors would be, and she has none  Past Medical History  Diagnosis Date  . Irregular heart beat     normal eval by Dr. Mayford Knife in past    Past Surgical History  Procedure Laterality Date  . Cesarean section  1999    History   Social History  . Marital Status: Married    Spouse Name: N/A    Number of Children: N/A  . Years of Education: N/A   Occupational History  . works at [employer]    Social History Main Topics  . Smoking status: Never Smoker   . Smokeless tobacco: Never Used  . Alcohol Use: 0.5 oz/week    1 drink(s) per week     Comment: 1 glass per evening (approx 8 ounces)  . Drug Use: No  . Sexual Activity: Yes    Birth Control/ Protection: Other-see comments     Comment:  vasectomy   Other Topics Concern  . Not on file   Social History Narrative   Married, 3 children, 2 dogs.  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    Family History  Problem Relation Age of Onset  . Heart disease Maternal Grandmother   . Hypertension Mother   . Hyperlipidemia Father   . Cancer Sister     thyroid  . Diabetes Neg Hx     No current outpatient prescriptions on file.  Allergies  Allergen Reactions  . Penicillins Hives and Itching   ROS:  The patient denies anorexia, fever, weight changes, headaches,  vision changes, decreased hearing, ear pain, sore throat, breast concerns, chest pain, palpitations, dizziness, syncope, dyspnea on exertion, cough, swelling, nausea, vomiting, diarrhea, constipation, abdominal pain, melena, hematochezia, indigestion/heartburn, hematuria, incontinence, dysuria, irregular menstrual cycles, vaginal discharge, odor or itch, genital lesions, joint pains, numbness, tingling, weakness, tremor, suspicious skin lesions, depression, anxiety, abnormal bleeding/bruising, or enlarged lymph nodes.  PHYSICAL EXAM: BP 128/74  Pulse 64  Ht 5\' 3"  (1.6 m)  Wt 135 lb (61.236 kg)  BMI 23.92 kg/m2  LMP 10/28/2012  General Appearance:    Alert, cooperative, no distress, appears stated age  Head:    Normocephalic, without obvious abnormality, atraumatic  Eyes:    PERRL, conjunctiva/corneas clear, EOM's intact, fundi  benign  Ears:    Normal TM's and external ear canals.  Dry patches behind ears R>L  Nose:   Nares normal, mucosa normal, no drainage or sinus   tenderness  Throat:   Lips, mucosa, and tongue normal; teeth and gums normal  Neck:   Supple, no lymphadenopathy;  thyroid:  no   enlargement/tenderness/nodules; no carotid   bruit or JVD  Back:    Spine nontender, no curvature, ROM normal, no CVA     tenderness  Lungs:     Clear to auscultation bilaterally without wheezes, rales or     ronchi; respirations  unlabored  Chest Wall:    No tenderness or deformity   Heart:    Regular rate and rhythm, S1 and S2 normal, no murmur, rub   or gallop  Breast Exam:    Deferred to GYN  Abdomen:     Soft, non-tender, nondistended, normoactive bowel sounds,    no masses, no hepatosplenomegaly  Genitalia:    Deferred to GYN     Extremities:   No clubbing, cyanosis or edema  Pulses:   2+ and symmetric all extremities  Skin:   Skin color, texture, turgor normal.  Dry patches behind ears.  Cheeks are erythematous (no telangiectasias, no acne or pustules).  The area of redness is lower cheeks, lateral to her nasolabial folds.  No seb derm changes  Lymph nodes:   Cervical, supraclavicular, and axillary nodes normal  Neurologic:   CNII-XII intact, normal strength, sensation and gait; reflexes 2+ and symmetric throughout          Psych:   Normal mood, affect, hygiene and grooming.     ASSESSMENT/PLAN:  Routine general medical examination at a health care facility - Plan: Visual acuity screening, POCT Urinalysis Dipstick  Need for prophylactic vaccination and inoculation against influenza - Plan: Flu Vaccine QUAD 36+ mos IM  Need for Tdap vaccination - Plan: Tdap vaccine greater than or equal to 7yo IM  Eczema  Rosacea  Eczema (vs seb derm, but no other areas involved).  Will try OTC hydrocortisone cream, and if ineffective will call for rx TAC. Likely has mild rosacea.  Minimally symptomatic.  Discussed that erythema of rosacea is the slowest to respond to meds, and discussed avoidance of triggers (but not exercise!)  Discussed monthly self breast exams and yearly mammograms after the age of 18; at least 30 minutes of aerobic activity at least 5 days/week; proper sunscreen use reviewed; healthy diet, including goals of calcium and vitamin D intake and alcohol recommendations (less than or equal to 1 drink/day) reviewed; regular seatbelt use; changing batteries in smoke detectors.  Immunization recommendations  discussed--flu shot and TdaP today.  Colonoscopy recommendations reviewed--age 65

## 2013-03-01 ENCOUNTER — Encounter: Payer: Self-pay | Admitting: Family Medicine

## 2013-03-01 ENCOUNTER — Ambulatory Visit (INDEPENDENT_AMBULATORY_CARE_PROVIDER_SITE_OTHER): Payer: BC Managed Care – PPO | Admitting: Family Medicine

## 2013-03-01 VITALS — BP 120/80 | HR 64

## 2013-03-01 DIAGNOSIS — R413 Other amnesia: Secondary | ICD-10-CM

## 2013-03-01 DIAGNOSIS — E871 Hypo-osmolality and hyponatremia: Secondary | ICD-10-CM

## 2013-03-01 LAB — COMPREHENSIVE METABOLIC PANEL
ALBUMIN: 4.4 g/dL (ref 3.5–5.2)
ALK PHOS: 34 U/L — AB (ref 39–117)
ALT: 17 U/L (ref 0–35)
AST: 23 U/L (ref 0–37)
BILIRUBIN TOTAL: 0.4 mg/dL (ref 0.3–1.2)
BUN: 11 mg/dL (ref 6–23)
CO2: 24 mEq/L (ref 19–32)
Calcium: 9.3 mg/dL (ref 8.4–10.5)
Chloride: 105 mEq/L (ref 96–112)
Creat: 0.56 mg/dL (ref 0.50–1.10)
Glucose, Bld: 93 mg/dL (ref 70–99)
POTASSIUM: 4.1 meq/L (ref 3.5–5.3)
SODIUM: 138 meq/L (ref 135–145)
TOTAL PROTEIN: 6.6 g/dL (ref 6.0–8.3)

## 2013-03-01 NOTE — Patient Instructions (Signed)
Make sure that you get adequate sleep. Try and minimize your distractions, especially while driving.  Look into the [place]. Keep your mind active
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
## 2013-03-02 LAB — TSH: TSH: 3.148 u[IU]/mL (ref 0.350–4.500)

## 2013-03-02 LAB — VITAMIN B12: Vitamin B-12: 442 pg/mL (ref 211–911)

## 2013-03-12 ENCOUNTER — Other Ambulatory Visit: Payer: Self-pay

## 2013-03-12 DIAGNOSIS — Z1231 Encounter for screening mammogram for malignant neoplasm of breast: Secondary | ICD-10-CM

## 2013-03-15 ENCOUNTER — Encounter: Payer: BC Managed Care – PPO | Admitting: Women's Health

## 2013-03-16 ENCOUNTER — Encounter: Payer: Self-pay | Admitting: Women's Health

## 2013-03-23 ENCOUNTER — Other Ambulatory Visit (HOSPITAL_COMMUNITY)
Admission: RE | Admit: 2013-03-23 | Discharge: 2013-03-23 | Disposition: A | Discharge status: Home / Self Care | Payer: BC Managed Care – PPO | Source: Ambulatory Visit | Attending: Gynecology | Admitting: Gynecology

## 2013-03-23 ENCOUNTER — Encounter: Payer: Self-pay | Admitting: Women's Health

## 2013-03-23 ENCOUNTER — Ambulatory Visit (INDEPENDENT_AMBULATORY_CARE_PROVIDER_SITE_OTHER): Payer: BC Managed Care – PPO | Admitting: Women's Health

## 2013-03-23 VITALS — BP 100/60 | Ht 63.0 in | Wt 121.0 lb

## 2013-03-23 DIAGNOSIS — E079 Disorder of thyroid, unspecified: Secondary | ICD-10-CM

## 2013-03-23 DIAGNOSIS — Z01419 Encounter for gynecological examination (general) (routine) without abnormal findings: Secondary | ICD-10-CM

## 2013-03-23 DIAGNOSIS — Z13220 Encounter for screening for lipoid disorders: Secondary | ICD-10-CM

## 2013-03-23 LAB — CBC WITH DIFFERENTIAL/PLATELET
BASOS ABS: 0 10*3/uL (ref 0.0–0.1)
Basophils Relative: 0 % (ref 0–1)
EOS ABS: 0 10*3/uL (ref 0.0–0.7)
EOS PCT: 1 % (ref 0–5)
HEMATOCRIT: 36.1 % (ref 36.0–46.0)
Hemoglobin: 12.5 g/dL (ref 12.0–15.0)
LYMPHS ABS: 1.6 10*3/uL (ref 0.7–4.0)
Lymphocytes Relative: 29 % (ref 12–46)
MCH: 32.1 pg (ref 26.0–34.0)
MCHC: 34.6 g/dL (ref 30.0–36.0)
MCV: 92.6 fL (ref 78.0–100.0)
MONO ABS: 0.4 10*3/uL (ref 0.1–1.0)
Monocytes Relative: 8 % (ref 3–12)
Neutro Abs: 3.4 10*3/uL (ref 1.7–7.7)
Neutrophils Relative %: 62 % (ref 43–77)
PLATELETS: 274 10*3/uL (ref 150–400)
RBC: 3.9 MIL/uL (ref 3.87–5.11)
RDW: 12.6 % (ref 11.5–15.5)
WBC: 5.4 10*3/uL (ref 4.0–10.5)

## 2013-03-23 LAB — TSH: TSH: 1.931 u[IU]/mL (ref 0.350–4.500)

## 2013-03-23 LAB — LIPID PANEL
CHOL/HDL RATIO: 3.1 ratio
Cholesterol: 189 mg/dL (ref 0–200)
HDL: 61 mg/dL (ref >39)
LDL CALC: 97 mg/dL (ref 0–99)
Triglycerides: 153 mg/dL — ABNORMAL HIGH (ref <150)
VLDL: 31 mg/dL (ref 0–40)

## 2013-03-23 NOTE — Patient Instructions (Signed)

## 2013-03-23 NOTE — Progress Notes (Signed)
Deborah Meadows [birth year] [id number]    History:    Presents for annual exam.  Regular monthly cycles//not sexually active/separated from husband. Normal Pap and mammogram history.  Past medical history, past surgical history, family history and social history were all reviewed and documented in the EPIC chart. Works part time [employer] for [employer]. Daughters Morganton 15 and Lissa Merlin 13, son Ellard Artis 10. Mother hypertension, father hypercholesteremia, sister thyroid cancer.   ROS:  A  ROS was performed and pertinent positives and negatives are included.  Exam:  Filed Vitals:   03/23/13 1213  BP: 100/60    General appearance:  Normal Thyroid:  Symmetrical, normal in size, without palpable masses or nodularity. Respiratory  Auscultation:  Clear without wheezing or rhonchi Cardiovascular  Auscultation:  Regular rate, without rubs, murmurs or gallops  Edema/varicosities:  Not grossly evident Abdominal  Soft,nontender, without masses, guarding or rebound.  Liver/spleen:  No organomegaly noted  Hernia:  None appreciated  Skin  Inspection:  Grossly normal   Breasts: Examined lying and sitting.     Right: Without masses, retractions, discharge or axillary adenopathy.     Left: Without masses, retractions, discharge or axillary adenopathy. Gentitourinary   Inguinal/mons:  Normal without inguinal adenopathy  External genitalia:  Normal  BUS/Urethra/Skene's glands:  Normal  Vagina:  Normal  Cervix:  Normal  Uterus:   normal in size, shape and contour.  Midline and mobile  Adnexa/parametria:     Rt: Without masses or tenderness.   Lt: Without masses or tenderness.  Anus and perineum: Normal  Digital rectal exam: Normal sphincter tone without palpated masses or tenderness  Assessment/Plan:  48 y.o.Seperated WF G3P3  for annual exam with no complaints.  Normal GYN exam/not sexually active Situational stress  Plan: SBE's, continue annual mammogram with repeat tomography, history  of dense breast. Continue regular exercise, calcium rich diet, vitamin D 1000 daily encouraged. CBC, lipid panel, TSH, UA, Pap Pap normal 02/2012, reviewed new screening guidelines but preferred annual screen. Normal comprehensive metabolic 2 weeks ago. Reviewed and encouraged counseling as needed. Contraception reviewed and declined.   Huel Cote [facility], 12:50 PM 03/23/2013

## 2013-03-23 NOTE — Addendum Note (Signed)
Addended by: Alen Blew on: 03/23/2013 03:07 PM   Modules accepted: Orders

## 2013-03-24 LAB — URINALYSIS W MICROSCOPIC + REFLEX CULTURE
Bacteria, UA: NONE SEEN
Bilirubin Urine: NEGATIVE
Casts: NONE SEEN
Crystals: NONE SEEN
GLUCOSE, UA: NEGATIVE mg/dL
HGB URINE DIPSTICK: NEGATIVE
Ketones, ur: NEGATIVE mg/dL
LEUKOCYTES UA: NEGATIVE
NITRITE: NEGATIVE
PROTEIN: NEGATIVE mg/dL
Specific Gravity, Urine: 1.005 (ref 1.005–1.030)
Urobilinogen, UA: 0.2 mg/dL (ref 0.0–1.0)
pH: 8 (ref 5.0–8.0)

## 2013-04-02 ENCOUNTER — Ambulatory Visit
Admission: RE | Admit: 2013-04-02 | Discharge: 2013-04-02 | Disposition: A | Discharge status: Home / Self Care | Payer: BC Managed Care – PPO | Source: Ambulatory Visit

## 2013-04-02 DIAGNOSIS — Z1231 Encounter for screening mammogram for malignant neoplasm of breast: Secondary | ICD-10-CM

## 2013-12-13 ENCOUNTER — Encounter: Payer: Self-pay | Admitting: Women's Health

## 2014-04-27 ENCOUNTER — Other Ambulatory Visit: Payer: Self-pay | Admitting: Gynecology

## 2014-04-27 ENCOUNTER — Ambulatory Visit: Payer: Self-pay | Admitting: Gynecology

## 2014-04-27 ENCOUNTER — Encounter: Payer: Self-pay | Admitting: Gynecology

## 2014-04-27 ENCOUNTER — Other Ambulatory Visit: Payer: Self-pay

## 2014-04-27 ENCOUNTER — Ambulatory Visit (INDEPENDENT_AMBULATORY_CARE_PROVIDER_SITE_OTHER): Payer: BLUE CROSS/BLUE SHIELD | Admitting: Gynecology

## 2014-04-27 ENCOUNTER — Ambulatory Visit (INDEPENDENT_AMBULATORY_CARE_PROVIDER_SITE_OTHER): Payer: BLUE CROSS/BLUE SHIELD

## 2014-04-27 VITALS — Ht 63.0 in

## 2014-04-27 DIAGNOSIS — D252 Subserosal leiomyoma of uterus: Secondary | ICD-10-CM

## 2014-04-27 DIAGNOSIS — N939 Abnormal uterine and vaginal bleeding, unspecified: Secondary | ICD-10-CM

## 2014-04-27 DIAGNOSIS — N924 Excessive bleeding in the premenopausal period: Secondary | ICD-10-CM

## 2014-04-27 DIAGNOSIS — N831 Corpus luteum cyst of ovary, unspecified side: Secondary | ICD-10-CM

## 2014-04-27 DIAGNOSIS — D251 Intramural leiomyoma of uterus: Secondary | ICD-10-CM | POA: Insufficient documentation

## 2014-04-27 DIAGNOSIS — N938 Other specified abnormal uterine and vaginal bleeding: Secondary | ICD-10-CM

## 2014-04-27 DIAGNOSIS — N951 Menopausal and female climacteric states: Secondary | ICD-10-CM | POA: Insufficient documentation

## 2014-04-27 LAB — CBC WITH DIFFERENTIAL/PLATELET
BASOS ABS: 0 10*3/uL (ref 0.0–0.1)
BASOS PCT: 1 % (ref 0–1)
EOS PCT: 1 % (ref 0–5)
Eosinophils Absolute: 0 10*3/uL (ref 0.0–0.7)
HEMATOCRIT: 34.4 % — AB (ref 36.0–46.0)
Hemoglobin: 11.7 g/dL — ABNORMAL LOW (ref 12.0–15.0)
Lymphocytes Relative: 37 % (ref 12–46)
Lymphs Abs: 1.5 10*3/uL (ref 0.7–4.0)
MCH: 33.1 pg (ref 26.0–34.0)
MCHC: 34 g/dL (ref 30.0–36.0)
MCV: 97.5 fL (ref 78.0–100.0)
MPV: 9.3 fL (ref 8.6–12.4)
Monocytes Absolute: 0.4 10*3/uL (ref 0.1–1.0)
Monocytes Relative: 11 % (ref 3–12)
NEUTROS PCT: 50 % (ref 43–77)
Neutro Abs: 2 10*3/uL (ref 1.7–7.7)
PLATELETS: 243 10*3/uL (ref 150–400)
RBC: 3.53 MIL/uL — ABNORMAL LOW (ref 3.87–5.11)
RDW: 13.1 % (ref 11.5–15.5)
WBC: 4 10*3/uL (ref 4.0–10.5)

## 2014-04-27 LAB — TSH: TSH: 3.414 u[IU]/mL (ref 0.350–4.500)

## 2014-04-27 NOTE — Patient Instructions (Signed)
Uterine Fibroid A uterine fibroid is a growth (tumor) that occurs in your uterus. This type of tumor is not cancerous and does not spread out of the uterus. You can have one or many fibroids. Fibroids can vary in size, weight, and where they grow in the uterus. Some can become quite large. Most fibroids do not require medical treatment, but some can cause pain or heavy bleeding during and between periods. CAUSES  A fibroid is the result of a single uterine cell that keeps growing (unregulated), which is different than most cells in the human body. Most cells have a control mechanism that keeps them from reproducing without control.  SIGNS AND SYMPTOMS   Bleeding.  Pelvic pain and pressure.  Bladder problems due to the size of the fibroid.  Infertility and miscarriages depending on the size and location of the fibroid. DIAGNOSIS  Uterine fibroids are diagnosed through a physical exam. Your health care provider may feel the lumpy tumors during a pelvic exam. Ultrasonography may be done to get information regarding size, location, and number of tumors.  TREATMENT   Your health care provider may recommend watchful waiting. This involves getting the fibroid checked by your health care provider to see if it grows or shrinks.   Hormone treatment or an intrauterine device (IUD) may be prescribed.   Surgery may be needed to remove the fibroids (myomectomy) or the uterus (hysterectomy). This depends on your situation. When fibroids interfere with fertility and a woman wants to become pregnant, a health care provider may recommend having the fibroids removed.  [place] care depends on how you were treated. In general:   Keep all follow-up appointments with your health care provider.   Only take over-the-counter or prescription medicines as directed by your health care provider. If you were prescribed a hormone treatment, take the hormone medicines exactly as directed. Do not  take aspirin. It can cause bleeding.   Talk to your health care provider about taking iron pills.  If your periods are troublesome but not so heavy, lie down with your feet raised slightly above your heart. Place cold packs on your lower abdomen.   If your periods are heavy, write down the number of pads or tampons you use per month. Bring this information to your health care provider.   Include green vegetables in your diet.  SEEK IMMEDIATE MEDICAL CARE IF:  You have pelvic pain or cramps not controlled with medicines.   You have a sudden increase in pelvic pain.   You have an increase in bleeding between and during periods.   You have excessive periods and soak tampons or pads in a half hour or less.  You feel lightheaded or have fainting episodes. Document Released: 01/26/2000 Document Revised: 11/18/2012 Document Reviewed: 08/27/2012 [facility] Patient Information 2015 [place], [place]. This information is not intended to replace advice given to you by your health care provider. Make sure you discuss any questions you have with your health care provider. Perimenopause Perimenopause is the time when your body begins to move into the menopause (no menstrual period for 12 straight months). It is a natural process. Perimenopause can begin 2-8 years before the menopause and usually lasts for 1 year after the menopause. During this time, your ovaries may or may not produce an egg. The ovaries vary in their production of estrogen and progesterone hormones each month. This can cause irregular menstrual periods, difficulty getting pregnant, vaginal bleeding between periods, and uncomfortable symptoms. CAUSES  Irregular production of the ovarian hormones, estrogen and progesterone, and not ovulating every month.  Other causes include:  Tumor of the pituitary gland in the brain.  Medical disease that affects the ovaries.  Radiation treatment.  Chemotherapy.  Unknown  causes.  Heavy smoking and excessive alcohol intake can bring on perimenopause sooner. SIGNS AND SYMPTOMS   Hot flashes.  Night sweats.  Irregular menstrual periods.  Decreased sex drive.  Vaginal dryness.  Headaches.  Mood swings.  Depression.  Memory problems.  Irritability.  Tiredness.  Weight gain.  Trouble getting pregnant.  The beginning of losing bone cells (osteoporosis).  The beginning of hardening of the arteries (atherosclerosis). DIAGNOSIS  Your health care provider will make a diagnosis by analyzing your age, menstrual history, and symptoms. He or she will do a physical exam and note any changes in your body, especially your female organs. Female hormone tests may or may not be helpful depending on the amount of female hormones you produce and when you produce them. However, other hormone tests may be helpful to rule out other problems. TREATMENT  In some cases, no treatment is needed. The decision on whether treatment is necessary during the perimenopause should be made by you and your health care provider based on how the symptoms are affecting you and your lifestyle. Various treatments are available, such as:  Treating individual symptoms with a specific medicine for that symptom.  Herbal medicines that can help specific symptoms.  Counseling.  Group therapy. HOME CARE INSTRUCTIONS   Keep track of your menstrual periods (when they occur, how heavy they are, how long between periods, and how long they last) as well as your symptoms and when they started.  Only take over-the-counter or prescription medicines as directed by your health care provider.  Sleep and rest.  Exercise.  Eat a diet that contains calcium (good for your bones) and soy (acts like the estrogen hormone).  Do not smoke.  Avoid alcoholic beverages.  Take vitamin supplements as recommended by your health care provider. Taking vitamin E may help in certain cases.  Take  calcium and vitamin D supplements to help prevent bone loss.  Group therapy is sometimes helpful.  Acupuncture may help in some cases. SEEK MEDICAL CARE IF:   You have questions about any symptoms you are having.  You need a referral to a specialist (gynecologist, psychiatrist, or psychologist). SEEK IMMEDIATE MEDICAL CARE IF:   You have vaginal bleeding.  Your period lasts longer than 8 days.  Your periods are recurring sooner than 21 days.  You have bleeding after intercourse.  You have severe depression.  You have pain when you urinate.  You have severe headaches.  You have vision problems. Document Released: 03/07/2004 Document Revised: 11/18/2012 Document Reviewed: 08/27/2012 [facility] Patient Information 2015 [place], [place]. This information is not intended to replace advice given to you by your health care provider. Make sure you discuss any questions you have with your health care provider.

## 2014-04-27 NOTE — Progress Notes (Signed)
   Patient is a 49 year old who is currently not sexually active who called the office after hours a few days ago as a result of irregular vaginal bleeding and was started on Megace 40 mg twice a day into she had a chance to come to the office today for further evaluation. She is here today for sonohysterogram as well as endometrial biopsy and blood test. Her history is as follows:  She reports normal at the end of February she had a brownish-like discharge for 4 days required no tampon February 25th she bled like a normal cycle for 4 days and for March 11 through the 14th she had a normal menstrual cycle but at 6 PM on the 14th she began having very heavy. With passage of large blood clots and cramping. Patient denied any vasomotor symptoms. She denies any nipple discharge. No unusual headaches or blurry vision. Will patient with no past history of any abnormal Pap smears reported. She was asymptomatic today. Patient is on no hormone replacement therapy or any oral contraceptive pill. Patient denies any GU or GI complaints.  Exam: Well developed well nourished female in no acute distress Pelvic: Done via ultrasound and sonohysterogram as follows:  Ultrasound: Uterus measured 8.0 x 5.5 x 5.9 cm with endometrial stripe of 5.2 mm. Patient had several subserous fibroids the largest and uterine 25 x 27 mm. Endometrial stripe was normal. Right ovary with a follicle measured 11 x 8 mm and a corpus luteum cyst measuring 11 x 12 mm with positive color flow in the periphery. Left ovary was normal. No fluid in the cul-de-sac.  The cervix was cleansed with Betadine solution and a sterile catheter was introduced into the uterine cavity normal saline was instilled and no intrauterine defects was noted. Following this a sterile Pipelle was introduced into the uterine cavity and tissue was obtained and submitted for histological evaluation.  Assessment/plan: Perimenopausal patient with isolated episode dysfunctional   uterine bleeding responding to Megace 40 mg twice a day which I have asked her to continue and complete a 10 day course. An endometrial biopsy was done today to rule out any evidence of endometrial hyperplasia or malignancy. We are also done again and [facility] and she is perimenopausal and the event of any ovulatory dysfunction were going to check a TSH and prolactin as well. She scheduled to return back in one month for her annual exam will continue to monitor her cycles and we will notify her with the results of the biopsy may becomes available.

## 2014-04-28 LAB — FOLLICLE STIMULATING HORMONE: FSH: 11.4 m[IU]/mL

## 2014-04-28 LAB — PROLACTIN: PROLACTIN: 12.5 ng/mL

## 2014-06-10 ENCOUNTER — Encounter: Payer: BLUE CROSS/BLUE SHIELD | Admitting: Women's Health

## 2014-06-15 ENCOUNTER — Ambulatory Visit (INDEPENDENT_AMBULATORY_CARE_PROVIDER_SITE_OTHER): Payer: BLUE CROSS/BLUE SHIELD | Admitting: Women's Health

## 2014-06-15 ENCOUNTER — Encounter: Payer: Self-pay | Admitting: Women's Health

## 2014-06-15 VITALS — BP 102/62 | Ht 63.0 in | Wt 119.9 lb

## 2014-06-15 DIAGNOSIS — Z01419 Encounter for gynecological examination (general) (routine) without abnormal findings: Secondary | ICD-10-CM | POA: Diagnosis not present

## 2014-06-15 DIAGNOSIS — Z13220 Encounter for screening for lipoid disorders: Secondary | ICD-10-CM

## 2014-06-15 DIAGNOSIS — L719 Rosacea, unspecified: Secondary | ICD-10-CM | POA: Diagnosis not present

## 2014-06-15 LAB — CBC WITH DIFFERENTIAL/PLATELET
BASOS ABS: 0 10*3/uL (ref 0.0–0.1)
Basophils Relative: 1 % (ref 0–1)
Eosinophils Absolute: 0 10*3/uL (ref 0.0–0.7)
Eosinophils Relative: 1 % (ref 0–5)
HEMATOCRIT: 37 % (ref 36.0–46.0)
Hemoglobin: 12.5 g/dL (ref 12.0–15.0)
LYMPHS PCT: 37 % (ref 12–46)
Lymphs Abs: 1.4 10*3/uL (ref 0.7–4.0)
MCH: 33 pg (ref 26.0–34.0)
MCHC: 33.8 g/dL (ref 30.0–36.0)
MCV: 97.6 fL (ref 78.0–100.0)
MPV: 9.5 fL (ref 8.6–12.4)
Monocytes Absolute: 0.5 10*3/uL (ref 0.1–1.0)
Monocytes Relative: 12 % (ref 3–12)
NEUTROS ABS: 1.9 10*3/uL (ref 1.7–7.7)
NEUTROS PCT: 49 % (ref 43–77)
PLATELETS: 237 10*3/uL (ref 150–400)
RBC: 3.79 MIL/uL — AB (ref 3.87–5.11)
RDW: 12.8 % (ref 11.5–15.5)
WBC: 3.8 10*3/uL — ABNORMAL LOW (ref 4.0–10.5)

## 2014-06-15 LAB — COMPREHENSIVE METABOLIC PANEL
ALT: 14 U/L (ref 0–35)
AST: 22 U/L (ref 0–37)
Albumin: 4.1 g/dL (ref 3.5–5.2)
Alkaline Phosphatase: 49 U/L (ref 39–117)
BUN: 16 mg/dL (ref 6–23)
CALCIUM: 9.3 mg/dL (ref 8.4–10.5)
CO2: 26 meq/L (ref 19–32)
CREATININE: 0.65 mg/dL (ref 0.50–1.10)
Chloride: 104 mEq/L (ref 96–112)
Glucose, Bld: 70 mg/dL (ref 70–99)
Potassium: 4 mEq/L (ref 3.5–5.3)
SODIUM: 138 meq/L (ref 135–145)
Total Bilirubin: 0.4 mg/dL (ref 0.2–1.2)
Total Protein: 6.6 g/dL (ref 6.0–8.3)

## 2014-06-15 LAB — LIPID PANEL
CHOLESTEROL: 204 mg/dL — AB (ref 0–200)
HDL: 100 mg/dL (ref >=46)
LDL Cholesterol: 87 mg/dL (ref 0–99)
Total CHOL/HDL Ratio: 2 Ratio
Triglycerides: 86 mg/dL (ref <150)
VLDL: 17 mg/dL (ref 0–40)

## 2014-06-15 LAB — TSH: TSH: 2.365 u[IU]/mL (ref 0.350–4.500)

## 2014-06-15 NOTE — Patient Instructions (Signed)
Colonoscopy  [facility]  [phone]  Health Maintenance Adopting a healthy lifestyle and getting preventive care can go a long way to promote health and wellness. Talk with your health care provider about what schedule of regular examinations is right for you. This is a good chance for you to check in with your provider about disease prevention and staying healthy. In between checkups, there are plenty of things you can do on your own. Experts have done a lot of research about which lifestyle changes and preventive measures are most likely to keep you healthy. Ask your health care provider for more information. WEIGHT AND DIET  Eat a healthy diet  Be sure to include plenty of vegetables, fruits, low-fat dairy products, and lean protein.  Do not eat a lot of foods high in solid fats, added sugars, or salt.  Get regular exercise. This is one of the most important things you can do for your health.  Most adults should exercise for at least 150 minutes each week. The exercise should increase your heart rate and make you sweat (moderate-intensity exercise).  Most adults should also do strengthening exercises at least twice a week. This is in addition to the moderate-intensity exercise.  Maintain a healthy weight  Body mass index (BMI) is a measurement that can be used to identify possible weight problems. It estimates body fat based on height and weight. Your health care provider can help determine your BMI and help you achieve or maintain a healthy weight.  For females 68 years of age and older:   A BMI below 18.5 is considered underweight.  A BMI of 18.5 to 24.9 is normal.  A BMI of 25 to 29.9 is considered overweight.  A BMI of 30 and above is considered obese.  Watch levels of cholesterol and blood lipids  You should start having your blood tested for lipids and cholesterol at 51 years of age, then have this test every 5 years.  You may need to have your cholesterol levels checked  more often if:  Your lipid or cholesterol levels are high.  You are older than 51 years of age.  You are at high risk for heart disease.  CANCER SCREENING   Lung Cancer  Lung cancer screening is recommended for adults 56-41 years old who are at high risk for lung cancer because of a history of smoking.  A yearly low-dose CT scan of the lungs is recommended for people who:  Currently smoke.  Have quit within the past 15 years.  Have at least a 30-pack-year history of smoking. A pack year is smoking an average of one pack of cigarettes a day for 1 year.  Yearly screening should continue until it has been 15 years since you quit.  Yearly screening should stop if you develop a health problem that would prevent you from having lung cancer treatment.  Breast Cancer  Practice breast self-awareness. This means understanding how your breasts normally appear and feel.  It also means doing regular breast self-exams. Let your health care provider know about any changes, no matter how small.  If you are in your 20s or 30s, you should have a clinical breast exam (CBE) by a health care provider every 1-3 years as part of a regular health exam.  If you are 43 or older, have a CBE every year. Also consider having a breast X-ray (mammogram) every year.  If you have a family history of breast cancer, talk to your health care provider  about genetic screening.  If you are at high risk for breast cancer, talk to your health care provider about having an MRI and a mammogram every year.  Breast cancer gene (BRCA) assessment is recommended for women who have family members with BRCA-related cancers. BRCA-related cancers include:  Breast.  Ovarian.  Tubal.  Peritoneal cancers.  Results of the assessment will determine the need for genetic counseling and BRCA1 and BRCA2 testing. Cervical Cancer Routine pelvic examinations to screen for cervical cancer are no longer recommended for  nonpregnant women who are considered low risk for cancer of the pelvic organs (ovaries, uterus, and vagina) and who do not have symptoms. A pelvic examination may be necessary if you have symptoms including those associated with pelvic infections. Ask your health care provider if a screening pelvic exam is right for you.   The Pap test is the screening test for cervical cancer for women who are considered at risk.  If you had a hysterectomy for a problem that was not cancer or a condition that could lead to cancer, then you no longer need Pap tests.  If you are older than 65 years, and you have had normal Pap tests for the past 10 years, you no longer need to have Pap tests.  If you have had past treatment for cervical cancer or a condition that could lead to cancer, you need Pap tests and screening for cancer for at least 20 years after your treatment.  If you no longer get a Pap test, assess your risk factors if they change (such as having a new sexual partner). This can affect whether you should start being screened again.  Some women have medical problems that increase their chance of getting cervical cancer. If this is the case for you, your health care provider may recommend more frequent screening and Pap tests.  The human papillomavirus (HPV) test is another test that may be used for cervical cancer screening. The HPV test looks for the virus that can cause cell changes in the cervix. The cells collected during the Pap test can be tested for HPV.  The HPV test can be used to screen women 6 years of age and older. Getting tested for HPV can extend the interval between normal Pap tests from three to five years.  An HPV test also should be used to screen women of any age who have unclear Pap test results.  After 51 years of age, women should have HPV testing as often as Pap tests.  Colorectal Cancer  This type of cancer can be detected and often prevented.  Routine colorectal cancer  screening usually begins at 51 years of age and continues through 51 years of age.  Your health care provider may recommend screening at an earlier age if you have risk factors for colon cancer.  Your health care provider may also recommend using home test kits to check for hidden blood in the stool.  A small camera at the end of a tube can be used to examine your colon directly (sigmoidoscopy or colonoscopy). This is done to check for the earliest forms of colorectal cancer.  Routine screening usually begins at age 21.  Direct examination of the colon should be repeated every 5-10 years through 51 years of age. However, you may need to be screened more often if early forms of precancerous polyps or small growths are found. Skin Cancer  Check your skin from head to toe regularly.  Tell your health care provider  about any new moles or changes in moles, especially if there is a change in a mole's shape or color.  Also tell your health care provider if you have a mole that is larger than the size of a pencil eraser.  Always use sunscreen. Apply sunscreen liberally and repeatedly throughout the day.  Protect yourself by wearing long sleeves, pants, a wide-brimmed hat, and sunglasses whenever you are outside. HEART DISEASE, DIABETES, AND HIGH BLOOD PRESSURE   Have your blood pressure checked at least every 1-2 years. High blood pressure causes heart disease and increases the risk of stroke.  If you are between 55 years and 79 years old, ask your health care provider if you should take aspirin to prevent strokes.  Have regular diabetes screenings. This involves taking a blood sample to check your fasting blood sugar level.  If you are at a normal weight and have a low risk for diabetes, have this test once every three years after 51 years of age.  If you are overweight and have a high risk for diabetes, consider being tested at a younger age or more often. PREVENTING INFECTION  Hepatitis  B  If you have a higher risk for hepatitis B, you should be screened for this virus. You are considered at high risk for hepatitis B if:  You were born in a country where hepatitis B is common. Ask your health care provider which countries are considered high risk.  Your parents were born in a high-risk country, and you have not been immunized against hepatitis B (hepatitis B vaccine).  You have HIV or AIDS.  You use needles to inject street drugs.  You live with someone who has hepatitis B.  You have had sex with someone who has hepatitis B.  You get hemodialysis treatment.  You take certain medicines for conditions, including cancer, organ transplantation, and autoimmune conditions. Hepatitis C  Blood testing is recommended for:  Everyone born from 1945 through 1965.  Anyone with known risk factors for hepatitis C. Sexually transmitted infections (STIs)  You should be screened for sexually transmitted infections (STIs) including gonorrhea and chlamydia if:  You are sexually active and are younger than 51 years of age.  You are older than 51 years of age and your health care provider tells you that you are at risk for this type of infection.  Your sexual activity has changed since you were last screened and you are at an increased risk for chlamydia or gonorrhea. Ask your health care provider if you are at risk.  If you do not have HIV, but are at risk, it may be recommended that you take a prescription medicine daily to prevent HIV infection. This is called pre-exposure prophylaxis (PrEP). You are considered at risk if:  You are sexually active and do not regularly use condoms or know the HIV status of your partner(s).  You take drugs by injection.  You are sexually active with a partner who has HIV. Talk with your health care provider about whether you are at high risk of being infected with HIV. If you choose to begin PrEP, you should first be tested for HIV. You should  then be tested every 3 months for as long as you are taking PrEP.  PREGNANCY   If you are premenopausal and you may become pregnant, ask your health care provider about preconception counseling.  If you may become pregnant, take 400 to 800 micrograms (mcg) of folic acid every day.  If you   want to prevent pregnancy, talk to your health care provider about birth control (contraception). OSTEOPOROSIS AND MENOPAUSE   Osteoporosis is a disease in which the bones lose minerals and strength with aging. This can result in serious bone fractures. Your risk for osteoporosis can be identified using a bone density scan.  If you are 70 years of age or older, or if you are at risk for osteoporosis and fractures, ask your health care provider if you should be screened.  Ask your health care provider whether you should take a calcium or vitamin D supplement to lower your risk for osteoporosis.  Menopause may have certain physical symptoms and risks.  Hormone replacement therapy may reduce some of these symptoms and risks. Talk to your health care provider about whether hormone replacement therapy is right for you.  HOME CARE INSTRUCTIONS   Schedule regular health, dental, and eye exams.  Stay current with your immunizations.   Do not use any tobacco products including cigarettes, chewing tobacco, or electronic cigarettes.  If you are pregnant, do not drink alcohol.  If you are breastfeeding, limit how much and how often you drink alcohol.  Limit alcohol intake to no more than 1 drink per day for nonpregnant women. One drink equals 12 ounces of beer, 5 ounces of wine, or 1 ounces of hard liquor.  Do not use street drugs.  Do not share needles.  Ask your health care provider for help if you need support or information about quitting drugs.  Tell your health care provider if you often feel depressed.  Tell your health care provider if you have ever been abused or do not feel safe at  home. Document Released: 08/13/2010 Document Revised: 06/14/2013 Document Reviewed: 12/30/2012 [facility] Patient Information 2015 [place], [place]. This information is not intended to replace advice given to you by your health care provider. Make sure you discuss any questions you have with your health care provider.

## 2014-06-15 NOTE — Progress Notes (Signed)
Deborah Meadows [birth year] [id number]    History:    Presents for annual exam.  Having cycles every 3 weeks, some periods heavy with clots. 04/2014  [place]  9 with negative sonohysterogram and biopsy. Normal Pap and mammogram history. Not sexually active/separated from husband. Rosacea.  Past medical history, past surgical history, family history and social history were all reviewed and documented in the EPIC chart. Part-time work with a bakery/specialty cookies. Daughters 97, 59, Evan 11 all doing well, oldest 2 have had gardasil. Mother hypertension, father hypercholesterolemia, sister thyroid cancer.  ROS:  A ROS was performed and pertinent positives and negatives are included.  Exam:  Filed Vitals:   06/15/14 1546  BP: 102/62    General appearance:  Normal Thyroid:  Symmetrical, normal in size, without palpable masses or nodularity. Respiratory  Auscultation:  Clear without wheezing or rhonchi Cardiovascular  Auscultation:  Regular rate, without rubs, murmurs or gallops  Edema/varicosities:  Not grossly evident Abdominal  Soft,nontender, without masses, guarding or rebound.  Liver/spleen:  No organomegaly noted  Hernia:  None appreciated  Skin  Inspection:  Grossly normal   Breasts: Examined lying and sitting.     Right: Without masses, retractions, discharge or axillary adenopathy.     Left: Without masses, retractions, discharge or axillary adenopathy. Gentitourinary   Inguinal/mons:  Normal without inguinal adenopathy  External genitalia:  Normal  BUS/Urethra/Skene's glands:  Normal  Vagina:  Normal  Cervix:  Normal  Uterus:   normal in size, shape and contour.  Midline and mobile  Adnexa/parametria:     Rt: Without masses or tenderness.   Lt: Without masses or tenderness.  Anus and perineum: Normal  Digital rectal exam: Normal sphincter tone without palpated masses or tenderness  Assessment/Plan:  49 y.o. MWF G6P3 for annual exam.    Irregular periods/not sexually  active/04/2014 negative sonohysterogram with negative biopsy Rosacea - dermatologist  Plan: Continue to  keep menstrual calendar, if cycles become too heavy and frequent can try Mirena IUD. Condoms encouraged if sexually active. SBE's, continue annual screening mammogram 3-D tomography, history of dense breasts. Continue healthy active lifestyle of exercise and diet. CBC, CMP, lipid panel, TSH ,UA, Pap normal 2015, new screening guidelines reviewed.    Huel Cote [facility], 4:27 PM 06/15/2014

## 2015-01-09 ENCOUNTER — Other Ambulatory Visit: Payer: Self-pay

## 2015-01-09 DIAGNOSIS — Z1231 Encounter for screening mammogram for malignant neoplasm of breast: Secondary | ICD-10-CM

## 2015-02-08 ENCOUNTER — Ambulatory Visit
Admission: RE | Admit: 2015-02-08 | Discharge: 2015-02-08 | Disposition: A | Discharge status: Home / Self Care | Payer: BLUE CROSS/BLUE SHIELD | Source: Ambulatory Visit

## 2015-02-08 DIAGNOSIS — Z1231 Encounter for screening mammogram for malignant neoplasm of breast: Secondary | ICD-10-CM

## 2015-02-09 ENCOUNTER — Ambulatory Visit: Payer: Self-pay

## 2015-02-10 ENCOUNTER — Encounter: Payer: Self-pay | Admitting: Women's Health

## 2015-03-29 ENCOUNTER — Ambulatory Visit (INDEPENDENT_AMBULATORY_CARE_PROVIDER_SITE_OTHER): Payer: BLUE CROSS/BLUE SHIELD | Admitting: Family Medicine

## 2015-03-29 ENCOUNTER — Encounter: Payer: Self-pay | Admitting: Family Medicine

## 2015-03-29 VITALS — BP 110/70 | HR 68 | Temp 99.0°F | Wt 120.2 lb

## 2015-03-29 DIAGNOSIS — R6889 Other general symptoms and signs: Secondary | ICD-10-CM

## 2015-03-29 LAB — POC INFLUENZA A&B (BINAX/QUICKVUE)
INFLUENZA A, POC: NEGATIVE
Influenza B, POC: NEGATIVE

## 2015-03-29 NOTE — Progress Notes (Signed)
   Subjective:    Patient ID: Deborah Meadows, female    DOB: 02/23/1965, 50 y.o.   MRN: [id number]  HPI Chief Complaint  Patient presents with  . possible flu    flu like symptoms, fever, chills, running nose, achy all over   She is here for flu like symptoms. States her symptoms started 5 days ago with sinus pressure, sneezing, left ear pain, headache, dry cough, back ache and last night she reports sweating and having chills all night. States her chest felt heavy with cough yesterday, ear pain and back ache since yesterday. Decreased appetite yesterday, only had fluids yesterday. Today had toast.  Watery diarrhea this morning nothing since. No blood or pus. Denies abdominal pain. Reports body aches improving today and overall she feels slightly better today. She did not receive flu shot this year.  Denies sore throat, nausea, vomiting.   Positive sick contacts, no known flu contact. Has been taking advil cold and sinus. Does not smoke. No recent antibiotic use.   Reviewed allergies, medications, past medical and social history.  Review of Systems Pertinent positives and negatives in the history of present illness.     Objective:   Physical Exam BP 110/70 mmHg  Pulse 68  Temp(Src) 99 F (37.2 C) (Oral)  Wt 120 lb 3.2 oz (54.522 kg) Alert and in no distress. No sinus tenderness. Nares red, mildly edematous without drainage. Tympanic membranes and canals are normal. Pharyngeal area is erythematous without edema or exudate Neck is supple without adenopathy. Cardiac exam shows a regular rhythm without murmurs, rubs, or gallops. Lungs are clear to auscultation.  Flu swab negative     Assessment & Plan:  Flu-like symptoms - Plan: POC Influenza A&B  Discussed that her symptoms are most likely related to a viral etiology and I recommend symptomatic treatment at this point. She appears to be improving overall. Discussed that the one episode of diarrhea she had this morning may be related  or could be a separate issue that is in the early stages. Recommend she stay well hydrated and call and let me know if she has any symptoms that worsen. Discussed that and negative flu swab is not 100% accurate for flu diagnosis. However, at this point in her illness, Tamiflu would not be recommended. Recommend Tylenol or ibuprofen for fever and body aches. Over-the-counter Mucinex DM or Robitussin-DM for cough and she will let me know if she needs something stronger for cough.

## 2015-04-18 ENCOUNTER — Telehealth: Payer: Self-pay | Admitting: *Deleted

## 2015-04-18 MED ORDER — MEGESTROL ACETATE 20 MG PO TABS: ORAL_TABLET | ORAL | Status: DC

## 2015-04-18 NOTE — Telephone Encounter (Signed)
(  Dr. Toney Rakes patient) pt called c/o irregular bleeding had cycle end of feb 21st estimated date lasted 5 days, cycle then started again on 04/14/15 has small clots, changing tampons about every 2 1/2 hours, had SHGM with JF in 2016. Pt asked if megace could be prescribed to stop bleeding, no pain. Please advise

## 2015-04-18 NOTE — Telephone Encounter (Signed)
Okay for Megace 20 mg twice a day times several days until bleeding slows then once daily for 1 week. Recommend patient set up an appointment regardless with Dr. Toney Rakes to discuss longer-term plan if irregular bleeding continues.

## 2015-04-18 NOTE — Telephone Encounter (Signed)
Pt informed with the below note, Rx sent. 

## 2015-04-26 ENCOUNTER — Encounter: Payer: Self-pay | Admitting: Women's Health

## 2015-04-26 ENCOUNTER — Ambulatory Visit (INDEPENDENT_AMBULATORY_CARE_PROVIDER_SITE_OTHER): Payer: BLUE CROSS/BLUE SHIELD | Admitting: Women's Health

## 2015-04-26 VITALS — BP 108/68

## 2015-04-26 DIAGNOSIS — N920 Excessive and frequent menstruation with regular cycle: Secondary | ICD-10-CM

## 2015-04-26 NOTE — Patient Instructions (Signed)
Levonorgestrel intrauterine device (IUD) What is this medicine? LEVONORGESTREL IUD (LEE voe nor jes trel) is a contraceptive (birth control) device. The device is placed inside the uterus by a healthcare professional. It is used to prevent pregnancy and can also be used to treat heavy bleeding that occurs during your period. Depending on the device, it can be used for 3 to 5 years. This medicine may be used for other purposes; ask your health care provider or pharmacist if you have questions. What should I tell my health care provider before I take this medicine? They need to know if you have any of these conditions: -abnormal Pap smear -cancer of the breast, uterus, or cervix -diabetes -endometritis -genital or pelvic infection now or in the past -have more than one sexual partner or your partner has more than one partner -heart disease -history of an ectopic or tubal pregnancy -immune system problems -IUD in place -liver disease or tumor -problems with blood clots or take blood-thinners -use intravenous drugs -uterus of unusual shape -vaginal bleeding that has not been explained -an unusual or allergic reaction to levonorgestrel, other hormones, silicone, or polyethylene, medicines, foods, dyes, or preservatives -pregnant or trying to get pregnant -breast-feeding How should I use this medicine? This device is placed inside the uterus by a health care professional. Talk to your pediatrician regarding the use of this medicine in children. Special care may be needed. Overdosage: If you think you have taken too much of this medicine contact a poison control center or emergency room at once. NOTE: This medicine is only for you. Do not share this medicine with others. What if I miss a dose? This does not apply. What may interact with this medicine? Do not take this medicine with any of the following medications: -amprenavir -bosentan -fosamprenavir This medicine may also interact with  the following medications: -aprepitant -barbiturate medicines for inducing sleep or treating seizures -bexarotene -griseofulvin -medicines to treat seizures like carbamazepine, ethotoin, felbamate, oxcarbazepine, phenytoin, topiramate -modafinil -pioglitazone -rifabutin -rifampin -rifapentine -some medicines to treat HIV infection like atazanavir, indinavir, lopinavir, nelfinavir, tipranavir, ritonavir -St. John's wort -warfarin This list may not describe all possible interactions. Give your health care provider a list of all the medicines, herbs, non-prescription drugs, or dietary supplements you use. Also tell them if you smoke, drink alcohol, or use illegal drugs. Some items may interact with your medicine. What should I watch for while using this medicine? Visit your doctor or health care professional for regular check ups. See your doctor if you or your partner has sexual contact with others, becomes HIV positive, or gets a sexual transmitted disease. This product does not protect you against HIV infection (AIDS) or other sexually transmitted diseases. You can check the placement of the IUD yourself by reaching up to the top of your vagina with clean fingers to feel the threads. Do not pull on the threads. It is a good habit to check placement after each menstrual period. Call your doctor right away if you feel more of the IUD than just the threads or if you cannot feel the threads at all. The IUD may come out by itself. You may become pregnant if the device comes out. If you notice that the IUD has come out use a backup birth control method like condoms and call your health care provider. Using tampons will not change the position of the IUD and are okay to use during your period. What side effects may I notice from receiving this medicine?   Side effects that you should report to your doctor or health care professional as soon as possible: -allergic reactions like skin rash, itching or  hives, swelling of the face, lips, or tongue -fever, flu-like symptoms -genital sores -high blood pressure -no menstrual period for 6 weeks during use -pain, swelling, warmth in the leg -pelvic pain or tenderness -severe or sudden headache -signs of pregnancy -stomach cramping -sudden shortness of breath -trouble with balance, talking, or walking -unusual vaginal bleeding, discharge -yellowing of the eyes or skin Side effects that usually do not require medical attention (report to your doctor or health care professional if they continue or are bothersome): -acne -breast pain -change in sex drive or performance -changes in weight -cramping, dizziness, or faintness while the device is being inserted -headache -irregular menstrual bleeding within first 3 to 6 months of use -nausea This list may not describe all possible side effects. Call your doctor for medical advice about side effects. You may report side effects to FDA at 1-800-FDA-1088. Where should I keep my medicine? This does not apply. NOTE: This sheet is a summary. It may not cover all possible information. If you have questions about this medicine, talk to your doctor, pharmacist, or health care provider.    2016, Elsevier/Gold Standard. (2011-02-28 13:54:04) Endometrial Ablation Endometrial ablation removes the lining of the uterus (endometrium). It is usually a same-day, outpatient treatment. Ablation helps avoid major surgery, such as surgery to remove the cervix and uterus (hysterectomy). After endometrial ablation, you will have little or no menstrual bleeding and may not be able to have children. However, if you are premenopausal, you will need to use a reliable method of birth control following the procedure because of the small chance that pregnancy can occur. There are different reasons to have this procedure. These reasons include:  Heavy periods.  Bleeding that is causing anemia.  Irregular  bleeding.  Bleeding fibroids on the lining inside the uterus if they are smaller than 3 centimeters. This procedure may not be possible for you if:   You want to have children in the future.   You have severe cramps with your menstrual period.   You have precancerous or cancerous cells in your uterus.   You were recently pregnant.   You have gone through menopause.   You have had major surgery on your uterus, resulting in thinning of the uterine wall. Surgeries may include:  The removal of one or more uterine fibroids (myomectomy).  A cesarean section with a classic (vertical) incision on your uterus. Ask your health care provider what type of cesarean you had. Sometimes the scar on your skin is different than the scar on your uterus. Even if you have had surgery on your uterus, certain types of ablation may still be safe for you. Talk with your health care provider. LET [facility] CARE PROVIDER KNOW ABOUT:  Any allergies you have.  All medicines you are taking, including vitamins, herbs, eye drops, creams, and over-the-counter medicines.  Previous problems you or members of your family have had with the use of anesthetics.  Any blood disorders you have.  Previous surgeries you have had.  Medical conditions you have. RISKS AND COMPLICATIONS  Generally, this is a safe procedure. However, as with any procedure, complications can occur. Possible complications include:  Perforation of the uterus.  Bleeding.  Infection of the uterus, bladder, or vagina.  Injury to surrounding organs.  An air bubble to the lung (air embolus).  Pregnancy following the procedure.  Failure of the procedure to help the problem, requiring hysterectomy.  Decreased ability to diagnose cancer in the lining of the uterus. BEFORE THE PROCEDURE  The lining of the uterus must be tested to make sure there is no pre-cancerous or cancer cells present.  An ultrasound may be performed to look  at the size of the uterus and to check for abnormalities.  Medicines may be given to thin the lining of the uterus. PROCEDURE  During the procedure, your health care provider will use a tool called a resectoscope to help see inside your uterus. There are different ways to remove the lining of your uterus.   Radiofrequency - This method uses a radiofrequency-alternating electric current to remove the lining of the uterus.  Cryotherapy - This method uses extreme cold to freeze the lining of the uterus.  Heated-Free Liquid - This method uses heated salt (saline) solution to remove the lining of the uterus.  Microwave - This method uses high-energy microwaves to heat up the lining of the uterus to remove it.  Thermal balloon - This method involves inserting a catheter with a balloon tip into the uterus. The balloon tip is filled with heated fluid to remove the lining of the uterus. AFTER THE PROCEDURE  After your procedure, do not have sexual intercourse or insert anything into your vagina until permitted by your health care provider. After the procedure, you may experience:  Cramps.  Vaginal discharge.  Frequent urination.   This information is not intended to replace advice given to you by your health care provider. Make sure you discuss any questions you have with your health care provider.   Document Released: 12/08/2003 Document Revised: 10/19/2014 Document Reviewed: 07/01/2012 Elsevier Interactive Patient Education [employer].

## 2015-04-26 NOTE — Progress Notes (Signed)
Patient ID: Deborah Meadows, female   DOB: [birth year], 50 y.o.   MRN: [id number] Presents for follow-up, was treated 04/18/2015 with Megace 20 mg twice daily until bleeding slowed and has been taking Megace 20 mg daily since. Was bleeding more than a pad per hour, large clots with gushes of blood. Bleeding has stopped. Had similar episode of heavy bleeding 1 year ago, had a negative sonohysterogram and negative endometrial biopsy. Continues to have regular monthly 3-5 day cycles. Not sexually active. One small intramural fibroid on ultrasound.  Exam: Appears well. External genitalia within normal limits, speculum exam no visible blood, cervix without lesion, polyp or inflammation. Bimanual uterus small nontender no adnexal fullness or tenderness.  1 episode of menorrhagia [name] with Megace  Plan: Will keep menstrual record, if heavy menstrual cycle next month return to office for sonohysterogram with Dr. Toney Rakes. Reviewed possible options of Mirena IUD, ablation, OCs. Will call if if problems.

## 2015-05-09 ENCOUNTER — Other Ambulatory Visit: Payer: Self-pay | Admitting: Gynecology

## 2015-05-09 ENCOUNTER — Telehealth: Payer: Self-pay

## 2015-05-09 ENCOUNTER — Other Ambulatory Visit: Payer: Self-pay | Admitting: Women's Health

## 2015-05-09 DIAGNOSIS — N920 Excessive and frequent menstruation with regular cycle: Secondary | ICD-10-CM

## 2015-05-09 MED ORDER — MEGESTROL ACETATE 20 MG PO TABS
ORAL_TABLET | ORAL | Status: DC
Start: 2015-05-09 — End: 2015-05-24

## 2015-05-09 NOTE — Telephone Encounter (Signed)
She is not sexually active. She had a negative sonohysterogram 04/2014 with negative biopsy. Okay to call in Megace but best to repeat sonohysterogram with Dr. Toney Rakes to make sure it is not a polyp. Tell her can not be bleeding with sonohysterogram.

## 2015-05-09 NOTE — Telephone Encounter (Signed)
Patient saw you on 04/26/15 for an episode of menorrhagia. Had has an episode the year prior around same time she said.  You prescribed Megace and told her if it happened again to let you know.  She took the Megace and it stopped after visit.  However, this past weekend she started again with "free bleeding". She said it was very heavy and she had 6 Megace left so she started back on them over the weekend "just to be able to get around".  She wants to know  #1  What now?  #2  If you will send more Megace for her to have on hand in case she starts to "free bleed" again.

## 2015-05-09 NOTE — Telephone Encounter (Signed)
Patient informed. Rx sent. SHGM order placed and appt made.

## 2015-05-10 ENCOUNTER — Other Ambulatory Visit: Payer: Self-pay | Admitting: Gynecology

## 2015-05-10 DIAGNOSIS — N939 Abnormal uterine and vaginal bleeding, unspecified: Secondary | ICD-10-CM

## 2015-05-10 DIAGNOSIS — N920 Excessive and frequent menstruation with regular cycle: Secondary | ICD-10-CM

## 2015-05-12 LAB — GLUCOSE, POCT (MANUAL RESULT ENTRY): POC GLUCOSE: 116 mg/dL — AB (ref 70–99)

## 2015-05-16 ENCOUNTER — Telehealth: Payer: Self-pay | Admitting: Gynecology

## 2015-05-16 NOTE — Telephone Encounter (Signed)
05/16/15-Pt was advised today that her [facility] will cover the sonohysterogram and bx if needed under her $30 copay. Per [id number]

## 2015-05-24 ENCOUNTER — Ambulatory Visit (INDEPENDENT_AMBULATORY_CARE_PROVIDER_SITE_OTHER): Payer: BLUE CROSS/BLUE SHIELD

## 2015-05-24 ENCOUNTER — Other Ambulatory Visit: Payer: Self-pay | Admitting: Gynecology

## 2015-05-24 ENCOUNTER — Encounter: Payer: Self-pay | Admitting: Gynecology

## 2015-05-24 ENCOUNTER — Ambulatory Visit (INDEPENDENT_AMBULATORY_CARE_PROVIDER_SITE_OTHER): Payer: BLUE CROSS/BLUE SHIELD | Admitting: Gynecology

## 2015-05-24 DIAGNOSIS — N951 Menopausal and female climacteric states: Secondary | ICD-10-CM

## 2015-05-24 DIAGNOSIS — N840 Polyp of corpus uteri: Secondary | ICD-10-CM | POA: Diagnosis not present

## 2015-05-24 DIAGNOSIS — N939 Abnormal uterine and vaginal bleeding, unspecified: Secondary | ICD-10-CM | POA: Diagnosis not present

## 2015-05-24 DIAGNOSIS — Z130 Encounter for screening for diseases of the blood and blood-forming organs and certain disorders involving the immune mechanism: Secondary | ICD-10-CM

## 2015-05-24 DIAGNOSIS — D252 Subserosal leiomyoma of uterus: Secondary | ICD-10-CM

## 2015-05-24 DIAGNOSIS — D251 Intramural leiomyoma of uterus: Secondary | ICD-10-CM

## 2015-05-24 DIAGNOSIS — N920 Excessive and frequent menstruation with regular cycle: Secondary | ICD-10-CM

## 2015-05-24 DIAGNOSIS — N9489 Other specified conditions associated with female genital organs and menstrual cycle: Secondary | ICD-10-CM

## 2015-05-24 LAB — FERRITIN: Ferritin: 54 ng/mL (ref 10–232)

## 2015-05-24 MED ORDER — MEGESTROL ACETATE 40 MG PO TABS: 40.0000 mg | ORAL_TABLET | Freq: Two times a day (BID) | ORAL | Status: DC

## 2015-05-24 NOTE — Progress Notes (Signed)
   Patient is a 50 year old that presented to the office today as part of her evaluation for her menorrhagia. Patient was seen by our nurse practitioner in March 15 of this year with the above-mentioned complaining was started on Megace 20 mg twice a day to stop her bleeding which she has succeeded. Review of her record indicated she had similar episode last year and had a sonohysterogram and endometrial biopsy with the following report:  Ultrasound: Uterus measured 8.0 x 5.5 x 5.9 cm with endometrial stripe of 5.2 mm. Patient had several subserous fibroids the largest and uterine 25 x 27 mm. Endometrial stripe was normal. Right ovary with a follicle measured 11 x 8 mm and a corpus luteum cyst measuring 11 x 12 mm with positive color flow in the periphery. Left ovary was normal. No fluid in the cul-de-sac.  Sonohysterogram did not demonstrate any intracavitary defect and her endometrial biopsy demonstrated the following: Diagnosis Endometrium, biopsy, uterus - WEAKLY PROLIFERATIVE ENDOMETRIUM. - NO HYPERPLASIA OR MALIGNANCY.  Her TSH, and FSH were normal last year as was her prolactin level and hemoglobin.  Patient was counseled for sonohysterogram and endometrial biopsy today. The ultrasound demonstrated the following:  Uterus measured 9.7 x 6.4 x 5.6 cm with endometrial stripe of 5.5 mm. Intramural fibroids measuring 20 x 27 mm posterior and an subserosal fibroid measuring 26 x 16 mm. Right and left ovary were normal. The cervix is then cleansed with Betadine solution and sterile catheter was introduced into the uterine cavity and a 10 x 9 x 8 mm posterior uterine polyp was noted. Following this a separate sterile Pipelle was introduced into the uterine cavity and endometrial biopsy was obtained moderate tissue was obtained and submitted for histological evaluation.  Assessment/plan: Perimenopausal patient with menorrhagia sonohysterogram demonstrated endometrial polyp. Endometrial biopsy done  today result pending at time of this dictation. We'll notify the patient with the results once they become available. I've recommended we proceed with an outpatient procedure of resectoscopic polypectomy and a NovaSure endometrial ablation if her endometrial biopsy today is normal. We'll check her CBC and ferritin level today along with her [place]. We'll schedule a preop consultation after we obtained a day for her surgery.

## 2015-05-24 NOTE — Patient Instructions (Signed)
Endometrial Ablation °Endometrial ablation removes the lining of the uterus (endometrium). It is usually a same-day, outpatient treatment. Ablation helps avoid major surgery, such as surgery to remove the cervix and uterus (hysterectomy). After endometrial ablation, you will have little or no menstrual bleeding and may not be able to have children. However, if you are premenopausal, you will need to use a reliable method of birth control following the procedure because of the small chance that pregnancy can occur. °There are different reasons to have this procedure. These reasons include: °· Heavy periods. °· Bleeding that is causing anemia. °· Irregular bleeding. °· Bleeding fibroids on the lining inside the uterus if they are smaller than 3 centimeters. °This procedure may not be possible for you if:  °· You want to have children in the future.   °· You have severe cramps with your menstrual period.   °· You have precancerous or cancerous cells in your uterus.   °· You were recently pregnant.   °· You have gone through menopause.   °· You have had major surgery on your uterus, resulting in thinning of the uterine wall. Surgeries may include: °¨ The removal of one or more uterine fibroids (myomectomy). °¨ A cesarean section with a classic (vertical) incision on your uterus. Ask your health care provider what type of cesarean you had. Sometimes the scar on your skin is different than the scar on your uterus. °Even if you have had surgery on your uterus, certain types of ablation may still be safe for you. Talk with your health care provider. °LET YOUR HEALTH CARE PROVIDER KNOW ABOUT: °· Any allergies you have. °· All medicines you are taking, including vitamins, herbs, eye drops, creams, and over-the-counter medicines. °· Previous problems you or members of your family have had with the use of anesthetics. °· Any blood disorders you have. °· Previous surgeries you have had. °· Medical conditions you have. °RISKS AND  COMPLICATIONS  °Generally, this is a safe procedure. However, as with any procedure, complications can occur. Possible complications include: °· Perforation of the uterus. °· Bleeding. °· Infection of the uterus, bladder, or vagina. °· Injury to surrounding organs. °· An air bubble to the lung (air embolus). °· Pregnancy following the procedure. °· Failure of the procedure to help the problem, requiring hysterectomy. °· Decreased ability to diagnose cancer in the lining of the uterus. °BEFORE THE PROCEDURE °· The lining of the uterus must be tested to make sure there is no pre-cancerous or cancer cells present. °· An ultrasound may be performed to look at the size of the uterus and to check for abnormalities. °· Medicines may be given to thin the lining of the uterus. °PROCEDURE  °During the procedure, your health care provider will use a tool called a resectoscope to help see inside your uterus. There are different ways to remove the lining of your uterus.  °· Radiofrequency - This method uses a radiofrequency-alternating electric current to remove the lining of the uterus. °· Cryotherapy - This method uses extreme cold to freeze the lining of the uterus. °· Heated-Free Liquid - This method uses heated salt (saline) solution to remove the lining of the uterus. °· Microwave - This method uses high-energy microwaves to heat up the lining of the uterus to remove it. °· Thermal balloon - This method involves inserting a catheter with a balloon tip into the uterus. The balloon tip is filled with heated fluid to remove the lining of the uterus. °AFTER THE PROCEDURE  °After your procedure, do   not have sexual intercourse or insert anything into your vagina until permitted by your health care provider. After the procedure, you may experience: °· Cramps. °· Vaginal discharge. °· Frequent urination. °  °This information is not intended to replace advice given to you by your health care provider. Make sure you discuss any  questions you have with your health care provider. °  °Document Released: 12/08/2003 Document Revised: 10/19/2014 Document Reviewed: 07/01/2012 °Elsevier Interactive Patient Education ©2016 Elsevier Inc. ° °

## 2015-05-25 ENCOUNTER — Telehealth: Payer: Self-pay

## 2015-05-25 ENCOUNTER — Other Ambulatory Visit: Payer: Self-pay | Admitting: Gynecology

## 2015-05-25 ENCOUNTER — Encounter: Payer: Self-pay | Admitting: Women's Health

## 2015-05-25 LAB — CBC WITH DIFFERENTIAL/PLATELET
BASOS ABS: 41 {cells}/uL (ref 0–200)
Basophils Relative: 1 %
EOS PCT: 1 %
Eosinophils Absolute: 41 cells/uL (ref 15–500)
HCT: 36.5 % (ref 35.0–45.0)
Hemoglobin: 12.2 g/dL (ref 11.7–15.5)
LYMPHS PCT: 38 %
Lymphs Abs: 1558 cells/uL (ref 850–3900)
MCH: 33.1 pg — AB (ref 27.0–33.0)
MCHC: 33.4 g/dL (ref 32.0–36.0)
MCV: 98.9 fL (ref 80.0–100.0)
MONOS PCT: 11 %
MPV: 9.3 fL (ref 7.5–12.5)
Monocytes Absolute: 451 cells/uL (ref 200–950)
NEUTROS PCT: 49 %
Neutro Abs: 2009 cells/uL (ref 1500–7800)
PLATELETS: 256 10*3/uL (ref 140–400)
RBC: 3.69 MIL/uL — ABNORMAL LOW (ref 3.80–5.10)
RDW: 13.3 % (ref 11.0–15.0)
WBC: 4.1 10*3/uL (ref 3.8–10.8)

## 2015-05-25 LAB — FOLLICLE STIMULATING HORMONE: FSH: 4.6 m[IU]/mL

## 2015-05-25 MED ORDER — MEGESTROL ACETATE 40 MG PO TABS: ORAL_TABLET | ORAL | Status: DC

## 2015-05-25 NOTE — Telephone Encounter (Signed)
Yes - thank you

## 2015-05-25 NOTE — Telephone Encounter (Signed)
I called patient about labs. She mentioned that she had SHGM/endo bx yesterday and still feeling crampy today. She asked if this was normal. I reassured her that this can be expected sometimes. I recommended if she could take Alleve or Advil that she could use that to help settle the cramping.  My recommendation sound Ok?

## 2015-05-25 NOTE — Telephone Encounter (Signed)
I contacted patient to schedule outpatient surgery. We discussed her ins benefits and estimated surgery prepayment due to [place]. I will be sending her a financial letter as well. She would like to have surgery on 07/04/15 at 7:30am at [facility].  Pre op appt with Dr. Moshe Salisbury was scheduled.

## 2015-05-25 NOTE — Telephone Encounter (Signed)
Resectoscopic Polypectomy and Novasure Ablation scheduled for 07/04/15. He has enough Megace to stay on it daily through April . She said her experience with Megace is that when she stops taking it that she starts bleeding again. She asked did you want her to take it daily until surgery. She will need a few more refills if so.

## 2015-05-25 NOTE — Telephone Encounter (Signed)
Patient informed. Rx sent 

## 2015-05-25 NOTE — Telephone Encounter (Signed)
Yes she can take 1 a 40 mg daily until a time of her surgery if she begins bleeding and she can take one twice a day. You can call and 30 tablets with 1 refill.

## 2015-06-01 ENCOUNTER — Encounter: Payer: Self-pay | Admitting: Anesthesiology

## 2015-06-23 ENCOUNTER — Telehealth: Payer: Self-pay

## 2015-06-23 NOTE — Telephone Encounter (Signed)
Opened in error

## 2015-06-26 NOTE — Patient Instructions (Signed)
Your procedure is scheduled on:  Tuesday, Jul 04, 2015  Enter through the Main Entrance of [facility] at:  6:00 AM  Pick up the phone at the desk and dial [phone].  Call this number if you have problems the morning of surgery: [phone].  Remember:  Do NOT eat food or drink after:  Midnight Monday, Jul 03, 2015  Take these medicines the morning of surgery with a SIP OF WATER: None  Do NOT wear jewelry (body piercing), metal hair clips/bobby pins, make-up, or nail polish. Do NOT wear lotions, powders, or perfumes.  You may wear deodorant. Do NOT shave for 48 hours prior to surgery. Do NOT bring valuables to the hospital. Contacts, dentures, or bridgework may not be worn into surgery.  Have a responsible adult drive you home and stay with you for 24 hours after your procedure

## 2015-06-27 ENCOUNTER — Ambulatory Visit (INDEPENDENT_AMBULATORY_CARE_PROVIDER_SITE_OTHER): Payer: BLUE CROSS/BLUE SHIELD | Admitting: Gynecology

## 2015-06-27 ENCOUNTER — Encounter (HOSPITAL_COMMUNITY): Payer: Self-pay

## 2015-06-27 ENCOUNTER — Encounter: Payer: Self-pay | Admitting: Gynecology

## 2015-06-27 ENCOUNTER — Encounter (HOSPITAL_COMMUNITY)
Admission: RE | Admit: 2015-06-27 | Discharge: 2015-06-27 | Disposition: A | Discharge status: Home / Self Care | Payer: BLUE CROSS/BLUE SHIELD | Source: Ambulatory Visit | Attending: Gynecology | Admitting: Gynecology

## 2015-06-27 VITALS — BP 128/76

## 2015-06-27 DIAGNOSIS — N840 Polyp of corpus uteri: Secondary | ICD-10-CM | POA: Diagnosis not present

## 2015-06-27 DIAGNOSIS — Z01812 Encounter for preprocedural laboratory examination: Secondary | ICD-10-CM | POA: Insufficient documentation

## 2015-06-27 DIAGNOSIS — N938 Other specified abnormal uterine and vaginal bleeding: Secondary | ICD-10-CM | POA: Diagnosis not present

## 2015-06-27 DIAGNOSIS — Z01818 Encounter for other preprocedural examination: Secondary | ICD-10-CM

## 2015-06-27 HISTORY — DX: Disease of blood and blood-forming organs, unspecified: D75.9

## 2015-06-27 HISTORY — DX: Anemia, unspecified: D64.9

## 2015-06-27 LAB — CBC
HEMATOCRIT: 36.6 % (ref 36.0–46.0)
Hemoglobin: 12.6 g/dL (ref 12.0–15.0)
MCH: 33.2 pg (ref 26.0–34.0)
MCHC: 34.4 g/dL (ref 30.0–36.0)
MCV: 96.6 fL (ref 78.0–100.0)
PLATELETS: 250 10*3/uL (ref 150–400)
RBC: 3.79 MIL/uL — ABNORMAL LOW (ref 3.87–5.11)
RDW: 12.4 % (ref 11.5–15.5)
WBC: 4.4 10*3/uL (ref 4.0–10.5)

## 2015-06-27 MED ORDER — OXYCODONE-ACETAMINOPHEN 5-325 MG PO TABS: 1.0000 | ORAL_TABLET | ORAL | Status: DC | PRN

## 2015-06-27 NOTE — Patient Instructions (Signed)
Endometrial Ablation °Endometrial ablation removes the lining of the uterus (endometrium). It is usually a same-day, outpatient treatment. Ablation helps avoid major surgery, such as surgery to remove the cervix and uterus (hysterectomy). After endometrial ablation, you will have little or no menstrual bleeding and may not be able to have children. However, if you are premenopausal, you will need to use a reliable method of birth control following the procedure because of the small chance that pregnancy can occur. °There are different reasons to have this procedure. These reasons include: °· Heavy periods. °· Bleeding that is causing anemia. °· Irregular bleeding. °· Bleeding fibroids on the lining inside the uterus if they are smaller than 3 centimeters. °This procedure may not be possible for you if:  °· You want to have children in the future.   °· You have severe cramps with your menstrual period.   °· You have precancerous or cancerous cells in your uterus.   °· You were recently pregnant.   °· You have gone through menopause.   °· You have had major surgery on your uterus, resulting in thinning of the uterine wall. Surgeries may include: °¨ The removal of one or more uterine fibroids (myomectomy). °¨ A cesarean section with a classic (vertical) incision on your uterus. Ask your health care provider what type of cesarean you had. Sometimes the scar on your skin is different than the scar on your uterus. °Even if you have had surgery on your uterus, certain types of ablation may still be safe for you. Talk with your health care provider. °LET YOUR HEALTH CARE PROVIDER KNOW ABOUT: °· Any allergies you have. °· All medicines you are taking, including vitamins, herbs, eye drops, creams, and over-the-counter medicines. °· Previous problems you or members of your family have had with the use of anesthetics. °· Any blood disorders you have. °· Previous surgeries you have had. °· Medical conditions you have. °RISKS AND  COMPLICATIONS  °Generally, this is a safe procedure. However, as with any procedure, complications can occur. Possible complications include: °· Perforation of the uterus. °· Bleeding. °· Infection of the uterus, bladder, or vagina. °· Injury to surrounding organs. °· An air bubble to the lung (air embolus). °· Pregnancy following the procedure. °· Failure of the procedure to help the problem, requiring hysterectomy. °· Decreased ability to diagnose cancer in the lining of the uterus. °BEFORE THE PROCEDURE °· The lining of the uterus must be tested to make sure there is no pre-cancerous or cancer cells present. °· An ultrasound may be performed to look at the size of the uterus and to check for abnormalities. °· Medicines may be given to thin the lining of the uterus. °PROCEDURE  °During the procedure, your health care provider will use a tool called a resectoscope to help see inside your uterus. There are different ways to remove the lining of your uterus.  °· Radiofrequency - This method uses a radiofrequency-alternating electric current to remove the lining of the uterus. °· Cryotherapy - This method uses extreme cold to freeze the lining of the uterus. °· Heated-Free Liquid - This method uses heated salt (saline) solution to remove the lining of the uterus. °· Microwave - This method uses high-energy microwaves to heat up the lining of the uterus to remove it. °· Thermal balloon - This method involves inserting a catheter with a balloon tip into the uterus. The balloon tip is filled with heated fluid to remove the lining of the uterus. °AFTER THE PROCEDURE  °After your procedure, do   not have sexual intercourse or insert anything into your vagina until permitted by your health care provider. After the procedure, you may experience: °· Cramps. °· Vaginal discharge. °· Frequent urination. °  °This information is not intended to replace advice given to you by your health care provider. Make sure you discuss any  questions you have with your health care provider. °  °Document Released: 12/08/2003 Document Revised: 10/19/2014 Document Reviewed: 07/01/2012 °Elsevier Interactive Patient Education ©2016 Elsevier Inc. ° °

## 2015-06-27 NOTE — Progress Notes (Signed)
Deborah Meadows is an 50 y.o. female for preoperative exam and consultation. Patient is perimenopausal with history of dysfunctional uterine bleeding and endometrial polyp is scheduled to undergo resectoscopic polypectomy and endometrial ablation via NovaSure technique next week. Her history is as follows  Patient was last seen the office on April 12 as part of her evaluation for her menorrhagia. Prior to that should been seen on March 15 [facility] nurse practitioner with the same complaint is is that she's been on Megace 20 mg twice a day to stop her bleeding. Her hemoglobin today was 12.6.  In 2016 patient had similar episode and her ultrasound and sonohysterogram and endometrial biopsy demonstrated the following: Ultrasound: Uterus measured 8.0 x 5.5 x 5.9 cm with endometrial stripe of 5.2 mm. Patient had several subserous fibroids the largest and uterine 25 x 27 mm. Endometrial stripe was normal. Right ovary with a follicle measured 11 x 8 mm and a corpus luteum cyst measuring 11 x 12 mm with positive color flow in the periphery. Left ovary was normal. No fluid in the cul-de-sac.  Sonohysterogram did not demonstrate any intracavitary defect and her endometrial biopsy demonstrated the following: Diagnosis Endometrium, biopsy, uterus - WEAKLY PROLIFERATIVE ENDOMETRIUM. - NO HYPERPLASIA OR MALIGNANCY.  Her TSH, and FSH were normal last year as was her prolactin level and hemoglobin.  On April 12 patient had a ultrasound and sonohysterogram and endometrial biopsy to compare with previous urine the following was noted: Uterus measured 9.7 x 6.4 x 5.6 cm with endometrial stripe of 5.5 mm. Intramural fibroids measuring 20 x 27 mm posterior and an subserosal fibroid measuring 26 x 16 mm. Right and left ovary were normal. The cervix is then cleansed with Betadine solution and sterile catheter was introduced into the uterine cavity and a 10 x 9 x 8 mm posterior uterine polyp was noted.   Her endometrial  biopsy demonstrated the following: Diagnosis Endometrium, biopsy - FRAGMENTS OF BENIGN ENDOMETRIAL TYPE POLYP.  Pertinent Gynecological History: Menses: Dysfunctional uterine bleeding Bleeding: dysfunctional uterine bleeding Contraception: vasectomy DES exposure: denies Blood transfusions: none Sexually transmitted diseases: no past history Previous GYN Procedures: One cesarean section, 2 vaginal birth after cesarean section, 3 D&Cs  Last mammogram: normal Date: 2016 Last pap: normal Date: 2015 OB History: G 6, P 3A3  Menstrual History: Menarche age: 2 Patient's last menstrual period was 04/07/2015 (exact date).    Past Medical History  Diagnosis Date  . Irregular heart beat     normal eval by Dr. Radford Pax in past  . Blood dyscrasia     had test 18 years ago. No diagnosis  . Anemia     borderline    Past Surgical History  Procedure Laterality Date  . Cesarean section  1999  . Dilation and curettage of uterus      x three  . Wisdom tooth extraction      x2    Family History  Problem Relation Age of Onset  . Heart disease Maternal Grandmother   . Hypertension Mother   . Hyperlipidemia Father   . Cancer Sister     thyroid  . Diabetes Neg Hx     Social History:  reports that she has never smoked. She has never used smokeless tobacco. She reports that she drinks about 0.5 oz of alcohol per week. She reports that she does not use illicit drugs.  Allergies:  Allergies  Allergen Reactions  . Penicillins Hives and Itching    Has patient had a  PCN reaction causing immediate rash, facial/tongue/throat swelling, SOB or lightheadedness with hypotension: Yes Has patient had a PCN reaction causing severe rash involving mucus membranes or skin necrosis: No Has patient had a PCN reaction that required hospitalization No Has patient had a PCN reaction occurring within the last 10 years: No If all of the above answers are "NO", then may proceed with Cephalosporin use.       (Not in a hospital admission)  REVIEW OF SYSTEMS: A ROS was performed and pertinent positives and negatives are included in the history.  GENERAL: No fevers or chills. HEENT: No change in vision, no earache, sore throat or sinus congestion. NECK: No pain or stiffness. CARDIOVASCULAR: No chest pain or pressure. No palpitations. PULMONARY: No shortness of breath, cough or wheeze. GASTROINTESTINAL: No abdominal pain, nausea, vomiting or diarrhea, melena or bright red blood per rectum. GENITOURINARY: No urinary frequency, urgency, hesitancy or dysuria. MUSCULOSKELETAL: No joint or muscle pain, no back pain, no recent trauma. DERMATOLOGIC: No rash, no itching, no lesions. ENDOCRINE: No polyuria, polydipsia, no heat or cold intolerance. No recent change in weight. HEMATOLOGICAL: No anemia or easy bruising or bleeding. NEUROLOGIC: No headache, seizures, numbness, tingling or weakness. PSYCHIATRIC: No depression, no loss of interest in normal activity or change in sleep pattern.     Blood pressure 128/76, last menstrual period 04/07/2015.  Physical Exam:  HEENT:unremarkable Neck:Supple, midline, no thyroid megaly, no carotid bruits Lungs:  Clear to auscultation no rhonchi's or wheezes Heart:Regular rate and rhythm, no murmurs or gallops Breast Exam: Symmetrical in appearance no palpable mass or tenderness no supra request lymphadenopathy Abdomen: Soft nontender no rebound or guarding Pelvic:BUS within normal limits Vagina: Dark brown blood present in the vaginal vault Cervix: No gross lesions no active bleeding Uterus: Anteverted slightly irregular secondary to fibroid Adnexa: No palpable masses or tenderness Extremities: No cords, no edema Rectal: Deferred  Results for orders placed or performed during the hospital encounter of 06/27/15 (from the past 24 hour(s))  CBC     Status: Abnormal   Collection Time: 06/27/15  2:23 PM  Result Value Ref Range   WBC 4.4 4.0 - 10.5 K/uL   RBC 3.79  (L) 3.87 - 5.11 MIL/uL   Hemoglobin 12.6 12.0 - 15.0 g/dL   HCT 36.6 36.0 - 46.0 %   MCV 96.6 78.0 - 100.0 fL   MCH 33.2 26.0 - 34.0 pg   MCHC 34.4 30.0 - 36.0 g/dL   RDW 12.4 11.5 - 15.5 %   Platelets 250 150 - 400 K/uL    No results found.  Assessment/Plan: 50 year old patient with menorrhagia endometrial biopsy demonstrating benign endometrial polyp also seen on sonohysterogram. Patient will undergo resectoscopic polypectomy as well as endometrial ablation via NovaSure technique. The following risk benefits and pros and cons of the operation were discussed:                        Patient was counseled as to the risk of surgery to include the following:  1. Infection (prohylactic antibiotics will be administered)  2. DVT/Pulmonary Embolism (prophylactic pneumo compression stockings will be used)  3.Trauma to internal organs requiring additional surgical procedure to repair any injury to     Internal organs requiring perhaps additional hospitalization days.  4.Hemmorhage requiring transfusion and blood products which carry risks such as   anaphylactic reaction, hepatitis and AIDS  Patient had received literature information on the procedure scheduled and all her questions were answered and  fully accepts all risk.   [facility] HMD3:32 PMTD     FERNANDEZ,JUAN H 06/27/2015, 3:25 PM

## 2015-06-30 ENCOUNTER — Telehealth: Payer: Self-pay | Admitting: *Deleted

## 2015-06-30 MED ORDER — MEGESTROL ACETATE 40 MG PO TABS: ORAL_TABLET | ORAL | Status: DC

## 2015-06-30 NOTE — Telephone Encounter (Signed)
Pt is schedule for D&C on 07/04/15 needs a short supply of megace to help with bleeding. Please advise

## 2015-06-30 NOTE — Telephone Encounter (Signed)
Pt informed Rx sent. 

## 2015-06-30 NOTE — Telephone Encounter (Signed)
Megace 40 mg BID # 20

## 2015-07-03 MED ORDER — GENTAMICIN SULFATE 40 MG/ML IJ SOLN
INTRAVENOUS | Status: AC
  Administered 2015-07-04: 30 mL via INTRAVENOUS
  Filled 2015-07-03: qty 6.75

## 2015-07-03 NOTE — Anesthesia Preprocedure Evaluation (Addendum)
Anesthesia Evaluation  Patient identified by MRN, date of birth, ID band Patient awake    Reviewed: Allergy & Precautions, H&P , NPO status , Patient's Chart, lab work & pertinent test results  Airway Mallampati: I  TM Distance: >3 FB Neck ROM: Full    Dental  (+) Teeth Intact, Dental Advisory Given   Pulmonary neg pulmonary ROS,    Pulmonary exam normal        Cardiovascular negative cardio ROS Normal cardiovascular exam     Neuro/Psych negative neurological ROS  negative psych ROS   GI/Hepatic negative GI ROS, Neg liver ROS,   Endo/Other  negative endocrine ROS  Renal/GU negative Renal ROS     Musculoskeletal   Abdominal   Peds  Hematology negative hematology ROS (+)   Anesthesia Other Findings   Reproductive/Obstetrics negative OB ROS                            Anesthesia Physical Anesthesia Plan  ASA: I  Anesthesia Plan: General LMA   Post-op Pain Management:    Induction:   Airway Management Planned:   Additional Equipment:   Intra-op Plan:   Post-operative Plan: Extubation in OR  Informed Consent: I have reviewed the patients History and Physical, chart, labs and discussed the procedure including the risks, benefits and alternatives for the proposed anesthesia with the patient or authorized representative who has indicated his/her understanding and acceptance.   Dental Advisory Given  Plan Discussed with:   Anesthesia Plan Comments:        Anesthesia Quick Evaluation

## 2015-07-04 ENCOUNTER — Ambulatory Visit (HOSPITAL_COMMUNITY): Payer: BLUE CROSS/BLUE SHIELD | Admitting: Anesthesiology

## 2015-07-04 ENCOUNTER — Encounter (HOSPITAL_COMMUNITY): Payer: Self-pay | Admitting: Emergency Medicine

## 2015-07-04 ENCOUNTER — Encounter (HOSPITAL_COMMUNITY)
Admission: RE | Disposition: A | Discharge status: Home / Self Care | Payer: Self-pay | Source: Ambulatory Visit | Attending: Gynecology

## 2015-07-04 ENCOUNTER — Ambulatory Visit (HOSPITAL_COMMUNITY)
Admission: RE | Admit: 2015-07-04 | Discharge: 2015-07-04 | Disposition: A | Discharge status: Home / Self Care | Payer: BLUE CROSS/BLUE SHIELD | Source: Ambulatory Visit | Attending: Gynecology | Admitting: Gynecology

## 2015-07-04 DIAGNOSIS — D251 Intramural leiomyoma of uterus: Secondary | ICD-10-CM | POA: Insufficient documentation

## 2015-07-04 DIAGNOSIS — N938 Other specified abnormal uterine and vaginal bleeding: Secondary | ICD-10-CM | POA: Diagnosis not present

## 2015-07-04 DIAGNOSIS — Z880 Allergy status to penicillin: Secondary | ICD-10-CM | POA: Insufficient documentation

## 2015-07-04 DIAGNOSIS — N840 Polyp of corpus uteri: Secondary | ICD-10-CM | POA: Diagnosis not present

## 2015-07-04 DIAGNOSIS — D252 Subserosal leiomyoma of uterus: Secondary | ICD-10-CM | POA: Diagnosis not present

## 2015-07-04 DIAGNOSIS — D649 Anemia, unspecified: Secondary | ICD-10-CM | POA: Diagnosis not present

## 2015-07-04 DIAGNOSIS — N920 Excessive and frequent menstruation with regular cycle: Secondary | ICD-10-CM | POA: Diagnosis not present

## 2015-07-04 HISTORY — PX: HYSTEROSCOPY WITH NOVASURE: SHX5574

## 2015-07-04 HISTORY — PX: DILATATION & CURETTAGE/HYSTEROSCOPY WITH MYOSURE: SHX6511

## 2015-07-04 LAB — PREGNANCY, URINE: PREG TEST UR: NEGATIVE

## 2015-07-04 SURGERY — DILATATION & CURETTAGE/HYSTEROSCOPY WITH MYOSURE
Anesthesia: General

## 2015-07-04 MED ORDER — LACTATED RINGERS IV SOLN
INTRAVENOUS | Status: DC
  Administered 2015-07-04 (×2): via INTRAVENOUS

## 2015-07-04 MED ORDER — LIDOCAINE HCL (CARDIAC) 20 MG/ML IV SOLN
INTRAVENOUS | Status: DC | PRN
  Administered 2015-07-04: 100 mg via INTRAVENOUS

## 2015-07-04 MED ORDER — DEXAMETHASONE SODIUM PHOSPHATE 10 MG/ML IJ SOLN
INTRAMUSCULAR | Status: AC
  Filled 2015-07-04: qty 1

## 2015-07-04 MED ORDER — LIDOCAINE-EPINEPHRINE (PF) 1 %-1:200000 IJ SOLN
INTRAMUSCULAR | Status: DC | PRN
  Administered 2015-07-04: 10 mL

## 2015-07-04 MED ORDER — ONDANSETRON HCL 4 MG/2ML IJ SOLN
INTRAMUSCULAR | Status: AC
  Filled 2015-07-04: qty 2

## 2015-07-04 MED ORDER — SCOPOLAMINE 1 MG/3DAYS TD PT72
1.0000 | MEDICATED_PATCH | TRANSDERMAL | Status: DC
  Administered 2015-07-04: 1.5 mg via TRANSDERMAL

## 2015-07-04 MED ORDER — FENTANYL CITRATE (PF) 100 MCG/2ML IJ SOLN
INTRAMUSCULAR | Status: AC
  Filled 2015-07-04: qty 2

## 2015-07-04 MED ORDER — SODIUM CHLORIDE 0.9 % IJ SOLN
INTRAMUSCULAR | Status: AC
  Filled 2015-07-04: qty 10

## 2015-07-04 MED ORDER — SCOPOLAMINE 1 MG/3DAYS TD PT72
MEDICATED_PATCH | TRANSDERMAL | Status: AC
  Administered 2015-07-04: 1.5 mg via TRANSDERMAL
  Filled 2015-07-04: qty 1

## 2015-07-04 MED ORDER — KETOROLAC TROMETHAMINE 30 MG/ML IJ SOLN
INTRAMUSCULAR | Status: DC | PRN
  Administered 2015-07-04: 30 mg via INTRAVENOUS

## 2015-07-04 MED ORDER — PROCHLORPERAZINE EDISYLATE 5 MG/ML IJ SOLN: 10.0000 mg | INTRAMUSCULAR | Status: DC | PRN

## 2015-07-04 MED ORDER — ONDANSETRON HCL 4 MG/2ML IJ SOLN
INTRAMUSCULAR | Status: DC | PRN
  Administered 2015-07-04: 4 mg via INTRAVENOUS

## 2015-07-04 MED ORDER — PROPOFOL 10 MG/ML IV BOLUS
INTRAVENOUS | Status: DC | PRN
  Administered 2015-07-04: 200 mg via INTRAVENOUS

## 2015-07-04 MED ORDER — VASOPRESSIN 20 UNIT/ML IV SOLN
INTRAVENOUS | Status: AC
  Filled 2015-07-04: qty 1

## 2015-07-04 MED ORDER — SODIUM CHLORIDE 0.9 % IR SOLN
Status: DC | PRN
  Administered 2015-07-04: 3000 mL

## 2015-07-04 MED ORDER — FENTANYL CITRATE (PF) 100 MCG/2ML IJ SOLN
INTRAMUSCULAR | Status: DC | PRN
  Administered 2015-07-04 (×2): 50 ug via INTRAVENOUS

## 2015-07-04 MED ORDER — HYDROMORPHONE HCL 1 MG/ML IJ SOLN: 0.2500 mg | INTRAMUSCULAR | Status: DC | PRN

## 2015-07-04 MED ORDER — LIDOCAINE HCL (CARDIAC) 20 MG/ML IV SOLN
INTRAVENOUS | Status: AC
  Filled 2015-07-04: qty 5

## 2015-07-04 MED ORDER — MIDAZOLAM HCL 2 MG/2ML IJ SOLN
INTRAMUSCULAR | Status: AC
  Filled 2015-07-04: qty 2

## 2015-07-04 MED ORDER — LIDOCAINE HCL 1 % IJ SOLN
INTRAMUSCULAR | Status: AC
  Filled 2015-07-04: qty 20

## 2015-07-04 MED ORDER — MIDAZOLAM HCL 5 MG/5ML IJ SOLN
INTRAMUSCULAR | Status: DC | PRN
  Administered 2015-07-04: 2 mg via INTRAVENOUS

## 2015-07-04 MED ORDER — FENTANYL CITRATE (PF) 100 MCG/2ML IJ SOLN
25.0000 ug | INTRAMUSCULAR | Status: DC | PRN
  Administered 2015-07-04: 50 ug via INTRAVENOUS

## 2015-07-04 MED ORDER — PROPOFOL 10 MG/ML IV BOLUS
INTRAVENOUS | Status: AC
  Filled 2015-07-04: qty 20

## 2015-07-04 MED ORDER — KETOROLAC TROMETHAMINE 30 MG/ML IJ SOLN
INTRAMUSCULAR | Status: AC
  Filled 2015-07-04: qty 1

## 2015-07-04 MED ORDER — SCOPOLAMINE 1 MG/3DAYS TD PT72
1.0000 | MEDICATED_PATCH | Freq: Once | TRANSDERMAL | Status: DC
Start: 2015-07-04 — End: 2015-07-04
  Administered 2015-07-04: 1.5 mg via TRANSDERMAL

## 2015-07-04 MED ORDER — DEXAMETHASONE SODIUM PHOSPHATE 4 MG/ML IJ SOLN
INTRAMUSCULAR | Status: DC | PRN
  Administered 2015-07-04: 10 mg via INTRAVENOUS

## 2015-07-04 SURGICAL SUPPLY — 29 items
ABLATOR ENDOMETRIAL BIPOLAR (ABLATOR) ×2 IMPLANT
CANISTERS HI-FLOW 3000CC (CANNISTER) ×2 IMPLANT
CATH FOLEY 2WAY SLVR 30CC 16FR (CATHETERS) IMPLANT
CATH ROBINSON RED A/P 16FR (CATHETERS) ×2 IMPLANT
CLOTH BEACON ORANGE TIMEOUT ST (SAFETY) ×2 IMPLANT
CONTAINER PREFILL 10% NBF 60ML (FORM) ×4 IMPLANT
DEVICE MYOSURE LITE (MISCELLANEOUS) ×1 IMPLANT
DEVICE MYOSURE REACH (MISCELLANEOUS) IMPLANT
ELECT REM PT RETURN 9FT ADLT (ELECTROSURGICAL)
ELECTRODE REM PT RTRN 9FT ADLT (ELECTROSURGICAL) IMPLANT
FILTER ARTHROSCOPY CONVERTOR (FILTER) ×2 IMPLANT
GLOVE BIOGEL PI IND STRL 6.5 (GLOVE) IMPLANT
GLOVE BIOGEL PI IND STRL 7.0 (GLOVE) ×1 IMPLANT
GLOVE BIOGEL PI IND STRL 8 (GLOVE) ×1 IMPLANT
GLOVE BIOGEL PI INDICATOR 6.5 (GLOVE) ×2
GLOVE BIOGEL PI INDICATOR 7.0 (GLOVE) ×1
GLOVE BIOGEL PI INDICATOR 8 (GLOVE) ×1
GLOVE ECLIPSE 7.5 STRL STRAW (GLOVE) ×4 IMPLANT
GOWN STRL REUS W/TWL LRG LVL3 (GOWN DISPOSABLE) ×4 IMPLANT
PACK VAGINAL MINOR WOMEN LF (CUSTOM PROCEDURE TRAY) ×2 IMPLANT
PAD OB MATERNITY 4.3X12.25 (PERSONAL CARE ITEMS) ×2 IMPLANT
PAD PREP 24X48 CUFFED NSTRL (MISCELLANEOUS) ×2 IMPLANT
PLUG CATH AND CAP STER (CATHETERS) IMPLANT
SEAL ROD LENS SCOPE MYOSURE (ABLATOR) ×2 IMPLANT
SYR 30ML LL (SYRINGE) IMPLANT
TOWEL OR 17X24 6PK STRL BLUE (TOWEL DISPOSABLE) ×4 IMPLANT
TUBING AQUILEX INFLOW (TUBING) ×2 IMPLANT
TUBING AQUILEX OUTFLOW (TUBING) ×2 IMPLANT
WATER STERILE IRR 1000ML POUR (IV SOLUTION) ×2 IMPLANT

## 2015-07-04 NOTE — Transfer of Care (Signed)
Immediate Anesthesia Transfer of Care Note  Patient: Deborah Meadows  Procedure(s) Performed: Procedure(s): DILATATION & CURETTAGE/HYSTEROSCOPY WITH MYOSURE (N/A) Novasure  (N/A)  Patient Location: PACU  Anesthesia Type:General  Level of Consciousness: awake, alert  and oriented  Airway & Oxygen Therapy: Patient Spontanous Breathing and Patient connected to nasal cannula oxygen  Post-op Assessment: Report given to RN and Post -op Vital signs reviewed and stable  Post vital signs: Reviewed and stable  Last Vitals:  Filed Vitals:   07/04/15 0625  BP: 142/84  Pulse: 72  Temp: 36.9 C  Resp: 16    Last Pain: There were no vitals filed for this visit.    Patients Stated Pain Goal: 4 ([id number])  Complications: No apparent anesthesia complications

## 2015-07-04 NOTE — H&P (View-Only) (Signed)
Deborah Meadows is an 50 y.o. female for preoperative exam and consultation. Patient is perimenopausal with history of dysfunctional uterine bleeding and endometrial polyp is scheduled to undergo resectoscopic polypectomy and endometrial ablation via NovaSure technique next week. Her history is as follows  Patient was last seen the office on April 12 as part of her evaluation for her menorrhagia. Prior to that should been seen on March 15 [facility] nurse practitioner with the same complaint is is that she's been on Megace 20 mg twice a day to stop her bleeding. Her hemoglobin today was 12.6.  In 2016 patient had similar episode and her ultrasound and sonohysterogram and endometrial biopsy demonstrated the following: Ultrasound: Uterus measured 8.0 x 5.5 x 5.9 cm with endometrial stripe of 5.2 mm. Patient had several subserous fibroids the largest and uterine 25 x 27 mm. Endometrial stripe was normal. Right ovary with a follicle measured 11 x 8 mm and a corpus luteum cyst measuring 11 x 12 mm with positive color flow in the periphery. Left ovary was normal. No fluid in the cul-de-sac.  Sonohysterogram did not demonstrate any intracavitary defect and her endometrial biopsy demonstrated the following: Diagnosis Endometrium, biopsy, uterus - WEAKLY PROLIFERATIVE ENDOMETRIUM. - NO HYPERPLASIA OR MALIGNANCY.  Her TSH, and FSH were normal last year as was her prolactin level and hemoglobin.  On April 12 patient had a ultrasound and sonohysterogram and endometrial biopsy to compare with previous urine the following was noted: Uterus measured 9.7 x 6.4 x 5.6 cm with endometrial stripe of 5.5 mm. Intramural fibroids measuring 20 x 27 mm posterior and an subserosal fibroid measuring 26 x 16 mm. Right and left ovary were normal. The cervix is then cleansed with Betadine solution and sterile catheter was introduced into the uterine cavity and a 10 x 9 x 8 mm posterior uterine polyp was noted.   Her endometrial  biopsy demonstrated the following: Diagnosis Endometrium, biopsy - FRAGMENTS OF BENIGN ENDOMETRIAL TYPE POLYP.  Pertinent Gynecological History: Menses: Dysfunctional uterine bleeding Bleeding: dysfunctional uterine bleeding Contraception: vasectomy DES exposure: denies Blood transfusions: none Sexually transmitted diseases: no past history Previous GYN Procedures: One cesarean section, 2 vaginal birth after cesarean section, 3 D&Cs  Last mammogram: normal Date: 2016 Last pap: normal Date: 2015 OB History: G 6, P 3A3  Menstrual History: Menarche age: 32 Patient's last menstrual period was 04/07/2015 (exact date).    Past Medical History  Diagnosis Date  . Irregular heart beat     normal eval by Dr. Radford Pax in past  . Blood dyscrasia     had test 18 years ago. No diagnosis  . Anemia     borderline    Past Surgical History  Procedure Laterality Date  . Cesarean section  1999  . Dilation and curettage of uterus      x three  . Wisdom tooth extraction      x2    Family History  Problem Relation Age of Onset  . Heart disease Maternal Grandmother   . Hypertension Mother   . Hyperlipidemia Father   . Cancer Sister     thyroid  . Diabetes Neg Hx     Social History:  reports that she has never smoked. She has never used smokeless tobacco. She reports that she drinks about 0.5 oz of alcohol per week. She reports that she does not use illicit drugs.  Allergies:  Allergies  Allergen Reactions  . Penicillins Hives and Itching    Has patient had a  PCN reaction causing immediate rash, facial/tongue/throat swelling, SOB or lightheadedness with hypotension: Yes Has patient had a PCN reaction causing severe rash involving mucus membranes or skin necrosis: No Has patient had a PCN reaction that required hospitalization No Has patient had a PCN reaction occurring within the last 10 years: No If all of the above answers are "NO", then may proceed with Cephalosporin use.       (Not in a hospital admission)  REVIEW OF SYSTEMS: A ROS was performed and pertinent positives and negatives are included in the history.  GENERAL: No fevers or chills. HEENT: No change in vision, no earache, sore throat or sinus congestion. NECK: No pain or stiffness. CARDIOVASCULAR: No chest pain or pressure. No palpitations. PULMONARY: No shortness of breath, cough or wheeze. GASTROINTESTINAL: No abdominal pain, nausea, vomiting or diarrhea, melena or bright red blood per rectum. GENITOURINARY: No urinary frequency, urgency, hesitancy or dysuria. MUSCULOSKELETAL: No joint or muscle pain, no back pain, no recent trauma. DERMATOLOGIC: No rash, no itching, no lesions. ENDOCRINE: No polyuria, polydipsia, no heat or cold intolerance. No recent change in weight. HEMATOLOGICAL: No anemia or easy bruising or bleeding. NEUROLOGIC: No headache, seizures, numbness, tingling or weakness. PSYCHIATRIC: No depression, no loss of interest in normal activity or change in sleep pattern.     Blood pressure 128/76, last menstrual period 04/07/2015.  Physical Exam:  HEENT:unremarkable Neck:Supple, midline, no thyroid megaly, no carotid bruits Lungs:  Clear to auscultation no rhonchi's or wheezes Heart:Regular rate and rhythm, no murmurs or gallops Breast Exam: Symmetrical in appearance no palpable mass or tenderness no supra request lymphadenopathy Abdomen: Soft nontender no rebound or guarding Pelvic:BUS within normal limits Vagina: Dark brown blood present in the vaginal vault Cervix: No gross lesions no active bleeding Uterus: Anteverted slightly irregular secondary to fibroid Adnexa: No palpable masses or tenderness Extremities: No cords, no edema Rectal: Deferred  Results for orders placed or performed during the hospital encounter of 06/27/15 (from the past 24 hour(s))  CBC     Status: Abnormal   Collection Time: 06/27/15  2:23 PM  Result Value Ref Range   WBC 4.4 4.0 - 10.5 K/uL   RBC 3.79  (L) 3.87 - 5.11 MIL/uL   Hemoglobin 12.6 12.0 - 15.0 g/dL   HCT 36.6 36.0 - 46.0 %   MCV 96.6 78.0 - 100.0 fL   MCH 33.2 26.0 - 34.0 pg   MCHC 34.4 30.0 - 36.0 g/dL   RDW 12.4 11.5 - 15.5 %   Platelets 250 150 - 400 K/uL    No results found.  Assessment/Plan: 50 year old patient with menorrhagia endometrial biopsy demonstrating benign endometrial polyp also seen on sonohysterogram. Patient will undergo resectoscopic polypectomy as well as endometrial ablation via NovaSure technique. The following risk benefits and pros and cons of the operation were discussed:                        Patient was counseled as to the risk of surgery to include the following:  1. Infection (prohylactic antibiotics will be administered)  2. DVT/Pulmonary Embolism (prophylactic pneumo compression stockings will be used)  3.Trauma to internal organs requiring additional surgical procedure to repair any injury to     Internal organs requiring perhaps additional hospitalization days.  4.Hemmorhage requiring transfusion and blood products which carry risks such as   anaphylactic reaction, hepatitis and AIDS  Patient had received literature information on the procedure scheduled and all her questions were answered and  fully accepts all risk.   [facility] HMD3:32 PMTD     FERNANDEZ,JUAN H 06/27/2015, 3:25 PM

## 2015-07-04 NOTE — Anesthesia Postprocedure Evaluation (Signed)
Anesthesia Post Note  Patient: Deborah Meadows  Procedure(s) Performed: Procedure(s) (LRB): DILATATION & CURETTAGE/HYSTEROSCOPY WITH MYOSURE (N/A) Novasure  (N/A)  Patient location during evaluation: PACU Anesthesia Type: General Level of consciousness: sedated Pain management: pain level controlled Vital Signs Assessment: post-procedure vital signs reviewed and stable Respiratory status: spontaneous breathing and respiratory function stable Cardiovascular status: stable Anesthetic complications: no     Last Vitals:  Filed Vitals:   07/04/15 0900 07/04/15 0915  BP: 129/97 138/83  Pulse: 62 62  Temp:    Resp: 17 16    Last Pain:  Filed Vitals:   07/04/15 0923  PainSc: 4    Pain Goal: Patients Stated Pain Goal: 4 (07/04/15 0625)               [place]

## 2015-07-04 NOTE — Discharge Instructions (Signed)
DISCHARGE INSTRUCTIONS: HYSTEROSCOPY / ENDOMETRIAL ABLATION The following instructions have been prepared to help you care for yourself upon your return home.  May Remove Scop patch on or before 07/07/15.  May take Ibuprofen after 1:30pm.  Personal hygiene:  Use sanitary pads for vaginal drainage, not tampons.  Shower the day after your procedure.  NO tub baths, pools or Jacuzzis for 2-3 weeks.  Wipe front to back after using the bathroom.  Activity and limitations:  Do NOT drive or operate any equipment for 24 hours. The effects of anesthesia are still present and drowsiness may result.  Do NOT rest in bed all day.  Walking is encouraged.  Walk up and down stairs slowly.  You may resume your normal activity in one to two days or as indicated by your physician. Sexual activity: NO intercourse for at least 2 weeks after the procedure, or as indicated by your Doctor.  Diet: Eat a light meal as desired this evening. You may resume your usual diet tomorrow.  Return to Work: You may resume your work activities in one to two days or as indicated by Marine scientist.  What to expect after your surgery: Expect to have vaginal bleeding/discharge for 2-3 days and spotting for up to 10 days. It is not unusual to have soreness for up to 1-2 weeks. You may have a slight burning sensation when you urinate for the first day. Mild cramps may continue for a couple of days. You may have a regular period in 2-6 weeks.  Call your doctor for any of the following:  Excessive vaginal bleeding or clotting, saturating and changing one pad every hour.  Inability to urinate 6 hours after discharge from hospital.  Pain not relieved by pain medication.  Fever of 100.4 F or greater.  Unusual vaginal discharge or odor.  Return to office _________________Call for an appointment ___________________ Patients signature: ______________________ Nurses signature ________________________  [facility] [phone]

## 2015-07-04 NOTE — Op Note (Signed)
Operative Note  07/04/2015  8:48 AM  PATIENT:  Deborah Meadows  50 y.o. female  PRE-OPERATIVE DIAGNOSIS:  endometrial polyp, menorrhagia  POST-OPERATIVE DIAGNOSIS:  endometrial polyp, menorrhagia  PROCEDURE:  Procedure(s): DILATATION & CURETTAGE/HYSTEROSCOPY WITH MYOSURE Novasure   SURGEON:  Surgeon(s): Terrance Mass, MD  ANESTHESIA:   general, as well as paracervical block with 1% lidocaine  FINDINGS: Anteverted uterus which sounded to 10 cm several small fluffy polyps in the lower uterine segment. Both tubal ostia identified. No endocervical abnormalities noted.  DESCRIPTION OF OPERATION: The patient was taken to the operating room where she underwent a successful general and tracheal anesthesia. A timeout was undertaken to properly identify the patient and voice a lot procedure to be undertaken. Because of patient's penicillin allergy she received gentamicin and clindamycin for prophylaxis. Also for DVT prophylaxis patient had PSA stockings. The vagina and perineum were prepped and draped in usual sterile fashion. A red rubber Robinson catheter was inserted into the bladder to evacuate its contents for partially 30 cc. An exam under anesthesia demonstrated the uterus is anteverted. The uterus sounded to 10 cm and the cervical length measurement 4 cm.  Patient's  legs were in the high lithotomy position. A weighted speculum was placed in the posterior vaginal vault. A single-tooth tenaculum was placed on the anterior cervical lip. The uterus sounded to 10 cm. praatt dilators  were used to dilate the cervical canal to a 18  mm size. 1% lidocaine was infiltrated into the cervical stroma for a paracervical block at the 2, 4, 8, and 10:00 position for a total 10 cc. The Hologic Myosure resectoscopic morcellator with a scope of 6.25 mm and an operating blade of 3.0 mm was introduced into the intrauterine cavity. 0.9% normal saline was the distending media. Inspection of the endometrial  cavity demonstrated multiple small uterine polyps located in the lower uterine segment. With the resectoscopic morcellator they were removed and submitted for histological evaluation.   Preparations were made then for the second part of the procedure which was a NovaSure endometrial ablation. The NovaSure handles were squeezed on the disposable device until the device was locked to ensure the controller array position was noted on the LED device. The width dial was greater than 2.5 cm. Once this was accomplished the lock was released in the handles were separated to retract the array. On the disposable device the uterine cavity length was set on 6 cm since uterus sounded to 10 cm and the cervical length was 4 cm. Maintain a slight traction on the tenaculum the device was inserted and With the axis of the uterus until the distal end of the device reach the fundus. After this the device was withdrawn to approximately 0.5 cm from the fundus and the device was slightly squeezed up to the point of some increased resistance in the width dial was reapproximated 0.5 cm without locking the device. Following this the device was slightly squeezed both handles while gently removing the device approximate half a centimeter to and from the fundus and rotating the handles of the disposable device 45 in a clockwise and counterclockwise position from the vertical plane until the handles locked completely. Once the device was locked completely gentle movement of device using an anterior posterior lateral movement was performed. To finally complete placement of the device it was slightly pulled back until the width on the dial reading reduced between 0.2 and 0.5 cm. While holding the tenaculum the device was advanced slowly and  gently to the fundus. Following this the cervical collar was advanced to the cervix. The width value on the gauge was greater than 0.25 cm (4.4 cm) the foot pedal was then pressed to begin the integrity  assessment. Once the integrity assessment was completed the device was enabled and the cycle was initiated and the ablation was started and lasted approximately 1 minute and 3 seconds. Following this the device was unlocked by releasing the button on the device and by closing the disposable device retracting into the sheath the device was pulled out from the uterus. Following this the hysteroscope was introduced and the cavity for inspection of the uterine cavity and complete ablation of the uterine fundus to the midportion of the cervix was evident. Pre-and post procedure pictures were obtained. The single-tooth tenaculum was removed. Patient was extubated transient to recovery stable vital signs. Fluid deficit was approximately 400 cc. Patient received 30 mg of Toradol in route to the recovery room.   ESTIMATED BLOOD LOSS: Minimal  Intake/Output Summary (Last 24 hours) at 07/04/15 0848 Last data filed at 07/04/15 0809  Gross per 24 hour  Intake   1300 ml  Output    140 ml  Net   1160 ml     BLOOD ADMINISTERED:none   LOCAL MEDICATIONS USED:  LIDOCAINE   SPECIMEN:  Source of Specimen:  1% lidocaine paracervical block total 10 cc  DISPOSITION OF SPECIMEN:  PATHOLOGY  COUNTS:  YES  PLAN OF CARE: Transfer to PACU  [facility] HMD8:48 AMTD@

## 2015-07-04 NOTE — Interval H&P Note (Signed)
History and Physical Interval Note:  07/04/2015 7:15 AM  Deborah Meadows  has presented today for surgery, with the diagnosis of endometrial polyp, menorrhagia  The various methods of treatment have been discussed with the patient and family. After consideration of risks, benefits and other options for treatment, the patient has consented to  Procedure(s): DILATATION & CURETTAGE/HYSTEROSCOPY WITH MYOSURE (N/A) Novasure  (N/A) as a surgical intervention .  The patient's history has been reviewed, patient examined, no change in status, stable for surgery.  I have reviewed the patient's chart and labs.  Questions were answered to the patient's satisfaction.     Terrance Mass

## 2015-07-04 NOTE — Anesthesia Procedure Notes (Signed)
Procedure Name: LMA Insertion Date/Time: 07/04/2015 7:26 AM Performed by: Riki Sheer Pre-anesthesia Checklist: Patient identified, Emergency Drugs available, Suction available, Patient being monitored and Timeout performed Patient Re-evaluated:Patient Re-evaluated prior to inductionOxygen Delivery Method: Circle system utilized Preoxygenation: Pre-oxygenation with 100% oxygen Intubation Type: IV induction Ventilation: Mask ventilation without difficulty LMA: LMA inserted LMA Size: 4.0 Number of attempts: 1 Placement Confirmation: positive ETCO2,  CO2 detector and breath sounds checked- equal and bilateral Tube secured with: Tape Dental Injury: Teeth and Oropharynx as per pre-operative assessment

## 2015-07-05 ENCOUNTER — Encounter (HOSPITAL_COMMUNITY): Payer: Self-pay | Admitting: Gynecology

## 2015-07-06 DIAGNOSIS — D2312 Other benign neoplasm of skin of left eyelid, including canthus: Secondary | ICD-10-CM | POA: Diagnosis not present

## 2015-07-06 DIAGNOSIS — H5213 Myopia, bilateral: Secondary | ICD-10-CM | POA: Diagnosis not present

## 2015-07-18 ENCOUNTER — Encounter: Payer: Self-pay | Admitting: Gynecology

## 2015-07-18 ENCOUNTER — Ambulatory Visit (INDEPENDENT_AMBULATORY_CARE_PROVIDER_SITE_OTHER): Payer: BLUE CROSS/BLUE SHIELD | Admitting: Gynecology

## 2015-07-18 VITALS — BP 136/84

## 2015-07-18 DIAGNOSIS — Z09 Encounter for follow-up examination after completed treatment for conditions other than malignant neoplasm: Secondary | ICD-10-CM

## 2015-07-18 NOTE — Patient Instructions (Signed)
Clindamycin vaginal cream What is this medicine? CLINDAMYCIN ([place] sin) is a lincosamide antibiotic. It is used to treat vaginal infections caused by certain bacteria. This medicine may be used for other purposes; ask your health care provider or pharmacist if you have questions. What should I tell my health care provider before I take this medicine? They need to know if you have any of these conditions: -diarrhea -inflammatory bowel disease -kidney or liver disease -stomach problems like colitis -an unusual or allergic reaction to clindamycin, lincomycin, other medicines, foods, dyes or preservatives -pregnant or trying to get pregnant -breast-feeding How should I use this medicine? This medicine is only for use in the vagina. Place in the vagina using the special applicator supplied with the cream. Wash hands before and after use. Fill the applicator with cream. Lie on your back, part and bend your knees. Insert the applicator into the vagina and push the plunger to expel the cream into the vagina. Wash the applicator with warm soapy water and rinse well. Keep this medicine out of the eyes. If you do get any in your eyes rinse out with plenty of cool tap water. Take your medicine at regular intervals. Do not take your medicine more often than directed. Use this medicine for the full course prescribed by your doctor or health care professional, even if you think your condition is better. Do not stop using except on your the advice of your doctor or health care professional. Talk to your pediatrician regarding the use of this medicine in children. Special care may be needed. Overdosage: If you think you have taken too much of this medicine contact a poison control center or emergency room at once. NOTE: This medicine is only for you. Do not share this medicine with others. What if I miss a dose? If you miss a dose, use it as soon as you can. If it is almost time for your next dose, use only  that dose. Do not use double or extra doses. What may interact with this medicine? Interactions are not expected. Do not use any other vaginal products without telling your doctor or health care professional. This list may not describe all possible interactions. Give your health care provider a list of all the medicines, herbs, non-prescription drugs, or dietary supplements you use. Also tell them if you smoke, drink alcohol, or use illegal drugs. Some items may interact with your medicine. What should I watch for while using this medicine? Tell your doctor or health care professional if your symptoms do not start to get better in a few days. Do not use tampons or douches while using this medicine. Do not have sex until you have finished your treatment. Having sex can make the treatment less effective. After you finish treatment, do not use latex condoms for three days. There may still be some medicine in the vagina. This can damage the latex and make the condom less effective at preventing pregnancy. Your clothing may get soiled. To help prevent reinfection, wear freshly washed cotton, not synthetic, underwear. What side effects may I notice from receiving this medicine? Side effects that you should report to your doctor or health care professional as soon as possible: -allergic reactions like skin rash, itching or hives, swelling of the face, lips, or tongue -diarrhea that is watery or severe -fever or chills, sore throat -increased thirst -itching of the vaginal or genital area -pain during sexual intercourse -stomach pain or cramps -thick white vaginal discharge -unusual  bleeding or bruising Side effects that usually do not require medical attention (report to your doctor or health care professional if they continue or are bothersome): -nausea, vomiting This list may not describe all possible side effects. Call your doctor for medical advice about side effects. You may report side effects  to FDA at 1-800-FDA-1088. Where should I keep my medicine? Keep out of the reach of children. Store at room temperature between 20 and 25 degrees C (68 and 77 degrees F). Do not freeze. Throw away any unused medicine after the expiration date. NOTE: This sheet is a summary. It may not cover all possible information. If you have questions about this medicine, talk to your doctor, pharmacist, or health care provider.    2016, Elsevier/Gold Standard. (2012-09-03 16:18:30)

## 2015-07-18 NOTE — Progress Notes (Signed)
   Patient is a 50 year old that presented to the office today for 2 weeks postop visit. On February 23 of this year patient underwent a resectoscopic polypectomy as well as NovaSure endometrial ablation secondary to endometrial polyps and perimenopausal menorrhagia. Patient is doing well from her surgery with no complaints. Pictures from her surgery were shown to the patient. Findings and pathology report discussed with the patient as well as follows:  FINDINGS: Anteverted uterus which sounded to 10 cm several small fluffy polyps in the lower uterine segment. Both tubal ostia identified. No endocervical abnormalities noted.  Diagnosis Endometrial polyp - ENDOMETRIOID-TYPE POLYP(S) WITH DECIDUALIZATION OF THE STROMA, CONSISTENT WITH HORMONE EFFECT. - FRAGMENTS OF MYOMETRIUM WITH ADENOMYOSIS. - THERE IS NO EVIDENCE OF HYPERPLASIA OR MALIGNANCY.  Exam: Gen. appearance well-developed on nourished female in no acute distress Abdomen: Soft nontender no rebound or guarding Pelvic: Bartholin urethra Skene was within normal limits Vagina: No lesions or discharge Uterus: Anteverted normal size shape and consistency Adnexa: No palpable masses or tenderness Rectal exam: Not done  Assessment/plan: Perimenopausal patient with metromenorrhagia attributed to endometrial polyp. Preoperative endometrial biopsy was benign. Resectoscopic polypectomy pathology report was benign. Patient had NovaSure endometrial ablation doing well. Patient on no hormone replacement therapy. Patient scheduled to return back in January 2018 for annual exam or when necessary.

## 2015-07-21 ENCOUNTER — Telehealth: Payer: Self-pay | Admitting: *Deleted

## 2015-07-21 NOTE — Telephone Encounter (Signed)
Pt called very confused about the medication that was prescribed at post op on 07/18/15. Pt was given a Rx for clindamycin, I explained to pt that Rx is to treat BV infection, pt said she was never told she has BV.  I didn't see anything confirming she has BV so I told pt I would check with you.  And if pt is to take clindamycin would this affect her stomach? Pt has stomach problems already, I told her shouldn't because it is a cream, but I would check with you. Please advise

## 2015-07-24 NOTE — Telephone Encounter (Signed)
Sometimes after resectoscopic polypctomy and ablation women have complained of a mild discharge with or without odor and this was this reason I had prescribed it IF she needed it. She has no infection

## 2015-07-24 NOTE — Telephone Encounter (Signed)
Pt informed with the below note. 

## 2015-09-28 DIAGNOSIS — H0015 Chalazion left lower eyelid: Secondary | ICD-10-CM | POA: Diagnosis not present

## 2015-10-04 NOTE — Progress Notes (Signed)
Chief Complaint  Patient presents with  . Annual Exam    fasting annual exam no pap, sees [name], NP. Did not do eye exam-had one in May with Dr. Gillian Scarce. Did not do urine, she went in waiting room but not having any urinary issues.     Deborah Meadows is a 50 y.o. female who presents for a complete physical.  She has the following concerns:  None. She is requesting full panel of labs today.  Understands that her cholesterol has been excellent--she would like it checked along with other labs . She is fasting today.  She had D&C, hysteroscopy, Myosure ablation in May with Dr. Toney Rakes.  She had a benign endometrial polyp.  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 Aveeno eczema cream works well. Not needing any topical steroids.  Rosacea:  Just redness on the cheeks, no breakouts.  Worse in the summer. Has never been treated.  Immunization History  Administered Date(s) Administered  . Influenza Split 11/01/2010  . Influenza,inj,Quad PF,36+ Mos 11/02/2012  . Tdap 11/02/2012   Last Pap smear: 03/2013 Last mammogram: 01/2015 Last colonoscopy: never; had sigmoidoscopy in her early 20's Last DEXA: never Dentist: over a year Ophtho: yearly in May Exercise: 30-60 minutes of walking at the park at least 3-5x/week.  Walks with hand weights (and does bicep/tricep exercises. Lipid screen: Lab Results  Component Value Date   CHOL 204 (H) 06/15/2014   HDL 100 06/15/2014   LDLCALC 87 06/15/2014   TRIG 86 06/15/2014   CHOLHDL 2.0 06/15/2014    Past Medical History:  Diagnosis Date  . Anemia    borderline  . Blood dyscrasia    had test 18 years ago. No diagnosis  . Irregular heart beat    normal eval by Dr. Radford Pax in past    Past Surgical History:  Procedure Laterality Date  . CESAREAN SECTION  1999  . DILATATION & CURETTAGE/HYSTEROSCOPY WITH MYOSURE N/A 07/04/2015   Procedure: DILATATION & CURETTAGE/HYSTEROSCOPY WITH MYOSURE;  Surgeon: Terrance Mass, MD;  Location: [facility];  Service: Gynecology;  Laterality: N/A;  . DILATION AND CURETTAGE OF UTERUS     x three  . HYSTEROSCOPY WITH NOVASURE N/A 07/04/2015   Procedure: Novasure ;  Surgeon: Terrance Mass, MD;  Location: [facility];  Service: Gynecology;  Laterality: N/A;  . WISDOM TOOTH EXTRACTION     x2    Social History   Social History  . Marital status: Married    Spouse name: N/A  . Number of children: N/A  . Years of education: N/A   Occupational History  . works at [employer]  . Smoking status: Never Smoker  . Smokeless tobacco: Never Used  . Alcohol use 0.5 oz/week    1 Standard drinks or equivalent per week     Comment: 1 glass per evening (approx 8 ounces)  . Drug use: No  . Sexual activity: Not Currently    Birth control/ protection: Other-see comments     Comment: vasectomy   Other Topics Concern  . Not on file   Social History Narrative   Married, 3 children, 2 dogs.  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    Family History  Problem Relation Age of Onset  . Hypertension Mother   . Hyperlipidemia Father   . Cancer Sister     thyroid  . Heart disease Maternal  Grandmother   . Alzheimer's disease Paternal Grandmother   . Diabetes Neg Hx     Outpatient Encounter Prescriptions as of 10/05/2015  Medication Sig Note  . neomycin-polymyxin b-dexamethasone (MAXITROL) 3.5-10000-0.1 OINT APPLY TO LEFT EYE NIGHTLY AT BEDTIME 10/05/2015: Received from: External Pharmacy  . [DISCONTINUED] oxyCODONE-acetaminophen (PERCOCET) 5-325 MG tablet Take 1 tablet by mouth every 4 (four) hours as needed for severe pain. (Patient not taking: Reported on 07/18/2015)    No facility-administered encounter medications on file as of 10/05/2015.     Allergies  Allergen Reactions  . Penicillins Hives and Itching    Has patient had a PCN reaction causing immediate rash, facial/tongue/throat swelling, SOB or lightheadedness  with hypotension: Yes Has patient had a PCN reaction causing severe rash involving mucus membranes or skin necrosis: No Has patient had a PCN reaction that required hospitalization No Has patient had a PCN reaction occurring within the last 10 years: No If all of the above answers are "NO", then may proceed with Cephalosporin use.     ROS:  The patient denies anorexia, fever, weight changes, headaches,  vision changes, decreased hearing, ear pain, sore throat, breast concerns, chest pain, palpitations, dizziness, syncope, dyspnea on exertion, cough, swelling, nausea, vomiting, diarrhea, constipation, abdominal pain, melena, hematochezia, indigestion/heartburn, hematuria, incontinence, dysuria, vaginal bleeding (just barely a bit of pink since the ablation), discharge, odor or itch, genital lesions, joint pains, numbness, tingling, weakness, tremor, suspicious skin lesions, depression, anxiety, abnormal bleeding/bruising, or enlarged lymph nodes. Stress incontinence with sneezing. Pink cheeks  PHYSICAL EXAM:  BP 138/80 (BP Location: Right Arm, Patient Position: Sitting, Cuff Size: Normal)   Pulse 72   Ht 5' 2.75" (1.594 m)   Wt 122 lb (55.3 kg)   BMI 21.78 kg/m  128/80 on repeat by MD  General Appearance:    Alert, cooperative, no distress, appears stated age  Head:    Normocephalic, without obvious abnormality, atraumatic  Eyes:    PERRL, conjunctiva/corneas clear, EOM's intact, fundi    benign  Ears:    Normal TM's and external ear canals.  Nose:   Nares normal, mucosa normal, no drainage or sinus   tenderness  Throat:   Lips, mucosa, and tongue normal; teeth and gums normal  Neck:   Supple, no lymphadenopathy;  thyroid:  no   enlargement/tenderness/nodules; no carotid   bruit or JVD  Back:    Spine nontender, no curvature, ROM normal, no CVA     tenderness  Lungs:     Clear to auscultation bilaterally without wheezes, rales or  ronchi; respirations unlabored  Chest Wall:    No  tenderness or deformity   Heart:    Regular rate and rhythm, S1 and S2 normal, no murmur, rub   or gallop  Breast Exam:    Deferred to GYN  Abdomen:     Soft, non-tender, nondistended, normoactive bowel sounds,    no masses, no hepatosplenomegaly  Genitalia:    Deferred to GYN     Extremities:   No clubbing, cyanosis or edema  Pulses:   2+ and symmetric all extremities  Skin:   Skin color, texture, turgor normal. Cheeks are slightly pink in the lower cheeks  Lymph nodes:   Cervical, supraclavicular, and axillary nodes normal  Neurologic:   CNII-XII intact, normal strength, sensation and gait; reflexes 2+ and symmetric throughout                 Psych:   Normal mood, affect, hygiene and grooming  ASSESSMENT/PLAN:  Annual physical exam - Plan: Lipid panel, Comprehensive metabolic panel, VITAMIN D 25 Hydroxy (Vit-D Deficiency, Fractures), TSH, Hepatitis C antibody  Need for hepatitis C screening test - Plan: Hepatitis C antibody  Need for prophylactic vaccination and inoculation against influenza - Plan: Flu Vaccine QUAD 36+ mos PF IM (Fluarix & Fluzone Quad PF)  Colon cancer screening - Plan: Ambulatory referral to Gastroenterology   TSH, lipid (pt desires), c-met, Vitamin D, Hepatitis C   Discussed monthly self breast exams and yearly mammograms; at least 30 minutes of aerobic activity at least 5 days/week, weight-bearing exercise at least 2x/week; proper sunscreen use reviewed; healthy diet, including goals of calcium and vitamin D intake and alcohol recommendations (less than or equal to 1 drink/day) reviewed; regular seatbelt use; changing batteries in smoke detectors.  Immunization recommendations discussed--flu shot today.  Colonoscopy recommendations reviewed--past due, refer to GI  F/u 1 year, sooner prn

## 2015-10-05 ENCOUNTER — Encounter: Payer: Self-pay | Admitting: Family Medicine

## 2015-10-05 ENCOUNTER — Ambulatory Visit (INDEPENDENT_AMBULATORY_CARE_PROVIDER_SITE_OTHER): Payer: BLUE CROSS/BLUE SHIELD | Admitting: Family Medicine

## 2015-10-05 VITALS — BP 128/80 | HR 72 | Ht 62.75 in | Wt 122.0 lb

## 2015-10-05 DIAGNOSIS — Z1211 Encounter for screening for malignant neoplasm of colon: Secondary | ICD-10-CM | POA: Diagnosis not present

## 2015-10-05 DIAGNOSIS — Z0000 Encounter for general adult medical examination without abnormal findings: Secondary | ICD-10-CM

## 2015-10-05 DIAGNOSIS — Z23 Encounter for immunization: Secondary | ICD-10-CM | POA: Diagnosis not present

## 2015-10-05 DIAGNOSIS — Z1159 Encounter for screening for other viral diseases: Secondary | ICD-10-CM

## 2015-10-05 DIAGNOSIS — E559 Vitamin D deficiency, unspecified: Secondary | ICD-10-CM | POA: Diagnosis not present

## 2015-10-05 LAB — TSH: TSH: 2.33 m[IU]/L

## 2015-10-05 NOTE — Patient Instructions (Signed)

## 2015-10-06 DIAGNOSIS — E559 Vitamin D deficiency, unspecified: Secondary | ICD-10-CM | POA: Insufficient documentation

## 2015-10-06 LAB — COMPREHENSIVE METABOLIC PANEL
ALBUMIN: 4.4 g/dL (ref 3.6–5.1)
ALT: 22 U/L (ref 6–29)
AST: 54 U/L — ABNORMAL HIGH (ref 10–35)
Alkaline Phosphatase: 40 U/L (ref 33–130)
BUN: 6 mg/dL — ABNORMAL LOW (ref 7–25)
CALCIUM: 9.5 mg/dL (ref 8.6–10.4)
CHLORIDE: 100 mmol/L (ref 98–110)
CO2: 26 mmol/L (ref 20–31)
Creat: 0.59 mg/dL (ref 0.50–1.05)
Glucose, Bld: 88 mg/dL (ref 65–99)
Potassium: 3.8 mmol/L (ref 3.5–5.3)
SODIUM: 136 mmol/L (ref 135–146)
Total Bilirubin: 0.6 mg/dL (ref 0.2–1.2)
Total Protein: 7 g/dL (ref 6.1–8.1)

## 2015-10-06 LAB — LIPID PANEL
CHOL/HDL RATIO: 1.7 ratio (ref <=5.0)
Cholesterol: 258 mg/dL — ABNORMAL HIGH (ref 125–200)
HDL: 156 mg/dL (ref >=46)
LDL Cholesterol: 91 mg/dL (ref <130)
TRIGLYCERIDES: 57 mg/dL (ref <150)
VLDL: 11 mg/dL (ref <30)

## 2015-10-06 LAB — HEPATITIS C ANTIBODY: HCV AB: NEGATIVE

## 2015-10-06 LAB — VITAMIN D 25 HYDROXY (VIT D DEFICIENCY, FRACTURES): Vit D, 25-Hydroxy: 26 ng/mL — ABNORMAL LOW (ref 30–100)

## 2015-10-06 MED ORDER — VITAMIN D (ERGOCALCIFEROL) 1.25 MG (50000 UNIT) PO CAPS: 50000.0000 [IU] | ORAL_CAPSULE | ORAL | 0 refills | Status: DC

## 2015-10-06 NOTE — Addendum Note (Signed)
Addended by: Rita Ohara on: 10/06/2015 07:30 AM   Modules accepted: Orders

## 2015-10-11 ENCOUNTER — Telehealth: Payer: Self-pay | Admitting: *Deleted

## 2015-10-11 ENCOUNTER — Other Ambulatory Visit: Payer: Self-pay | Admitting: *Deleted

## 2015-10-11 DIAGNOSIS — R748 Abnormal levels of other serum enzymes: Secondary | ICD-10-CM

## 2015-10-11 NOTE — Telephone Encounter (Signed)
Just lft's

## 2015-10-11 NOTE — Telephone Encounter (Signed)
Patient called and would like to come in and have the liver enzymes checked. I scheduled her a lab visit for 11/15/15-do you want a liver panel or another CMET?

## 2015-11-06 ENCOUNTER — Encounter: Payer: Self-pay | Admitting: Family Medicine

## 2015-11-15 ENCOUNTER — Other Ambulatory Visit: Payer: BLUE CROSS/BLUE SHIELD

## 2015-11-15 DIAGNOSIS — R748 Abnormal levels of other serum enzymes: Secondary | ICD-10-CM

## 2015-11-15 LAB — HEPATIC FUNCTION PANEL
ALT: 16 U/L (ref 6–29)
AST: 31 U/L (ref 10–35)
Albumin: 4.3 g/dL (ref 3.6–5.1)
Alkaline Phosphatase: 36 U/L (ref 33–130)
BILIRUBIN DIRECT: 0.2 mg/dL (ref <=0.2)
BILIRUBIN INDIRECT: 0.7 mg/dL (ref 0.2–1.2)
Total Bilirubin: 0.9 mg/dL (ref 0.2–1.2)
Total Protein: 6.8 g/dL (ref 6.1–8.1)

## 2016-01-01 ENCOUNTER — Encounter: Payer: Self-pay | Admitting: Gastroenterology

## 2016-01-03 ENCOUNTER — Ambulatory Visit (INDEPENDENT_AMBULATORY_CARE_PROVIDER_SITE_OTHER): Payer: BLUE CROSS/BLUE SHIELD | Admitting: Family Medicine

## 2016-01-03 ENCOUNTER — Encounter: Payer: Self-pay | Admitting: Family Medicine

## 2016-01-03 VITALS — BP 118/76 | HR 60 | Ht 62.75 in | Wt 116.6 lb

## 2016-01-03 DIAGNOSIS — E559 Vitamin D deficiency, unspecified: Secondary | ICD-10-CM | POA: Diagnosis not present

## 2016-01-03 DIAGNOSIS — M25551 Pain in right hip: Secondary | ICD-10-CM | POA: Diagnosis not present

## 2016-01-03 DIAGNOSIS — M7062 Trochanteric bursitis, left hip: Secondary | ICD-10-CM | POA: Diagnosis not present

## 2016-01-03 DIAGNOSIS — M79601 Pain in right arm: Secondary | ICD-10-CM

## 2016-01-03 MED ORDER — MELOXICAM 7.5 MG PO TABS: 7.5000 mg | ORAL_TABLET | Freq: Every day | ORAL | 0 refills | Status: DC

## 2016-01-03 NOTE — Patient Instructions (Addendum)
  Take the meloxicam once daily with food.   Do not use ibuprofen/advil/motrin/aleve/BC/goody powder or other OTC medications. You CAN use tylenol (acetaminophen) as needed for pain, fever, headache. Take the meloxicam regularly for 10-14 days.  If you are 100% better after 10 days, you can stop them.  Do the stretches you were shown--the pyriformis stretch (crossing the leg), the IT band stretches (hanging the leg off the side of the bed, and crossing legs and touching toes while standing). Try yoga poses such as pigeon (the one we did in the office). I highly recommend yoga since you also have some low back pain. Core strengthening is important (planks, cat/cow stretches) and hamstring stretches can help prevent back pain.  If your hip (and back and arm) symptoms are not improving despite the anti-inflammatory and stretches, physical therapy would be the next step. You could see Dr. Rhona Raider to see if he has any other suggestions for your right arm pain.  Consider trial of a compression sleeve while working. Ice is recommended (hip and arm) when you develop pain with activity (hip after walking, arm after working).  Take your Vitamin D 1000 IU daily. We will recheck this at your next physical.

## 2016-01-03 NOTE — Progress Notes (Signed)
Chief Complaint  Patient presents with  . Advice Only    joint pain x 2 months. Just finished vit D rx and got OTC 1,000iu vit d and will start this. Was asking to have a DEXA done. Also her right arm has been hurting her since her blood was drawn back in Aug but now she thinks it is something else-thinks it is her bicep.    She is complaining of bilateral lateral hip pain since early September.  She recalls pain starting 2 days after a long power walk (1 hour), longer than her usual.  The pain mainly started on the right side after that walk.  She thinks the left side pain has been there for longer, different, milder. She relates this to her "bad mattress".  Sometimes hurts at night to lay on herside (hurts more on the left side when she rolls over).  Has recurrent pain after power-walking for 30-35 minutes.  She needs to slow her pace to finish the walk.  Hasn't tried NSAIDs, heat, ice.  No pain currently.  No pain with weightbearing. She also has some knee pain, right elbow "wants to pop". Uses right arm at work recurrently [place]. Hears a lot of clicks/pops, not necessarily painful. Denies swelling.  She had seen Dr. Rhona Raider at a ballgame and he mentioned he thought it could be related to biceps, and recommended she see him at the office, but hasn't yet. +repetitive motion with RUE at work.  Denies significant back pain, just some intermittent discomfort in her lower back, without radiation.  Vitamin D deficiency: Vitamin D level was 26 in August. She was treated with 8 weeks of prescription therapy.  She has not yet started the 1000 IU daily vitamins, but she bought them and plans to start today   She had slightly elevated LFT in August (AST was 54).  Repeat 6 weeks later was completely normal. She now drinks just one glass of wine daily.  Lab Results  Component Value Date   ALT 16 11/15/2015   AST 31 11/15/2015   ALKPHOS 36 11/15/2015   BILITOT 0.9 11/15/2015   PMH, PSH, SH  reviewed  No current outpatient prescriptions on file prior to visit.   No current facility-administered medications on file prior to visit.    Allergies  Allergen Reactions  . Penicillins Hives and Itching    Has patient had a PCN reaction causing immediate rash, facial/tongue/throat swelling, SOB or lightheadedness with hypotension: Yes Has patient had a PCN reaction causing severe rash involving mucus membranes or skin necrosis: No Has patient had a PCN reaction that required hospitalization No Has patient had a PCN reaction occurring within the last 10 years: No If all of the above answers are "NO", then may proceed with Cephalosporin use.    ROS: no fever, chills, URI symptoms, headaches, GI complaints, bleeding, bruising, swelling or other problems, except as noted in the HPI.  No numbness, tingling.  PHYSICAL EXAM:  BP 118/76 (BP Location: Left Arm, Patient Position: Sitting, Cuff Size: Normal)   Pulse 60   Ht 5' 2.75" (1.594 m)   Wt 116 lb 9.6 oz (52.9 kg)   BMI 20.82 kg/m   Well appearing, talkative female in no distress  LE's: Pain at right lateral hip with adduction against resistance.  FROM without pain. Discomfort with pyriformis stretch on right. No pain, but feels stretch with IT band stretch bilaterally. Tender at left trochanteric bursa.  Normal UE exam--normal strength, no pain elicited with  flexion/extension/pronation/supination against resistance No clicking/crepitus noted   ASSESSMENT/PLAN:  Trochanteric bursitis, left hip - discussed NSAID use, vs cortisone shot.  Since many other painful areas, will try NSAID first - Plan: meloxicam (MOBIC) 7.5 MG tablet  Right hip pain - suspect some IT band, but also pyriformis and deep gluteal muscles.  Shown stretches. trial of NSAID. Consider PT. Yoga encouraged - Plan: meloxicam (MOBIC) 7.5 MG tablet  Right arm pain - normal exam now; related to overuse. Trial of compression, ice after activity. f/u with Dr.  Rhona Raider if not improving with NSAID, compression - Plan: meloxicam (MOBIC) 7.5 MG tablet  Vitamin D deficiency - s/p prescription, noncompliant with daily supplement.  To start 1000 IU vitamin D daily. Recheck at next CPE    Right lateral hip and buttock pain--suspect muscular +/- IT band. Shown IT band stretches, pyriformis stretches, pigeon pose and others  Likely overuse pain in RUE--normal exam now, clicking/discomfort only when working.  Left trochanteric bursitis  Trial of NSAID--risks/side effects reviewed in detail.  meloxicam 7.5mg  daily (not high dose, since not too painful overal, and fearful of side effects).  Vitamin D deficiency--take daily vitamin D  30 min visit, more than 1/2 spent counseling.   Take the meloxicam once daily with food.   Do not use ibuprofen/advil/motrin/aleve/BC/goody powder or other OTC medications. You CAN use tylenol (acetaminophen) as needed for pain, fever, headache. Take the meloxicam regularly for 10-14 days.  If you are 100% better after 10 days, you can stop them.  Do the stretches you were shown--the pyriformis stretch (crossing the leg), the IT band stretches (hanging the leg off the side of the bed, and crossing legs and touching toes while standing). Try yoga poses such as pigeon (the one we did in the office). I highly recommend yoga since you also have some low back pain. Core strengthening is important (planks, cat/cow stretches) and hamstring stretches can help prevent back pain.  If your hip (and back and arm) symptoms are not improving despite the anti-inflammatory and stretches, physical therapy would be the next step. You could see Dr. Rhona Raider to see if he has any other suggestions for your right arm pain.  Consider trial of a compression sleeve while working. Ice is recommended (hip and arm) when you develop pain with activity (hip after walking, arm after working).

## 2016-01-29 ENCOUNTER — Other Ambulatory Visit: Payer: Self-pay | Admitting: Women's Health

## 2016-01-29 ENCOUNTER — Other Ambulatory Visit: Payer: Self-pay | Admitting: Gynecology

## 2016-01-29 DIAGNOSIS — Z1231 Encounter for screening mammogram for malignant neoplasm of breast: Secondary | ICD-10-CM

## 2016-02-08 ENCOUNTER — Ambulatory Visit: Payer: BLUE CROSS/BLUE SHIELD | Admitting: *Deleted

## 2016-02-08 VITALS — Ht 63.0 in | Wt 115.8 lb

## 2016-02-08 DIAGNOSIS — Z1211 Encounter for screening for malignant neoplasm of colon: Secondary | ICD-10-CM

## 2016-02-08 MED ORDER — NA SULFATE-K SULFATE-MG SULF 17.5-3.13-1.6 GM/177ML PO SOLN: 1.0000 | Freq: Once | ORAL | 0 refills | Status: AC

## 2016-02-08 NOTE — Progress Notes (Signed)
Denies allergies to eggs or soy products. Denies complications with sedation or anesthesia. Denies O2 use. Denies use of diet or weight loss medications.  Emmi instructions given for colonoscopy.  

## 2016-02-14 ENCOUNTER — Telehealth: Payer: Self-pay | Admitting: Gastroenterology

## 2016-02-14 NOTE — Telephone Encounter (Signed)
Spoke with pt and we do have a pay no more than $50 coupon available.  Left at front desk for pt to pick up

## 2016-02-15 ENCOUNTER — Telehealth: Payer: Self-pay

## 2016-02-15 ENCOUNTER — Ambulatory Visit (INDEPENDENT_AMBULATORY_CARE_PROVIDER_SITE_OTHER): Payer: BLUE CROSS/BLUE SHIELD | Admitting: Family Medicine

## 2016-02-15 ENCOUNTER — Other Ambulatory Visit: Payer: Self-pay | Admitting: *Deleted

## 2016-02-15 ENCOUNTER — Encounter: Payer: Self-pay | Admitting: Family Medicine

## 2016-02-15 VITALS — BP 112/74 | HR 60 | Temp 99.1°F | Ht 62.75 in | Wt 117.0 lb

## 2016-02-15 DIAGNOSIS — R590 Localized enlarged lymph nodes: Secondary | ICD-10-CM

## 2016-02-15 DIAGNOSIS — L309 Dermatitis, unspecified: Secondary | ICD-10-CM | POA: Diagnosis not present

## 2016-02-15 MED ORDER — MUPIROCIN CALCIUM 2 % EX CREA: 1.0000 "application " | TOPICAL_CREAM | Freq: Three times a day (TID) | CUTANEOUS | 0 refills | Status: DC

## 2016-02-15 NOTE — Telephone Encounter (Signed)
Fax request rcvd for mupirion ointment 2% cream alternative as this is $344.37 through her insurance.

## 2016-02-15 NOTE — Telephone Encounter (Signed)
I prescribed cream--is this requesting ointment?  This is not for nasal use.  Okay to change to ointment if much less expensive

## 2016-02-15 NOTE — Patient Instructions (Signed)
  Contact us if you have increase in size/# of lymph nodes, if it is more painful, red, or if you develop any fever.  These are signs of infection for which I would prescribe antibiotic.  Right now it doesn't appear infected, just inflamed, and with reactive lymph node enlargement.  There is no evidence to suggest any lymphoma or cancer.

## 2016-02-15 NOTE — Progress Notes (Signed)
Chief Complaint  Patient presents with  . Adenopathy    swollen lymph node on left side on neck.     Noticed a swollen gland on the posterior left neck about a week ago.  Her eczema has been flaring on her scalp and behind her ears.  She is scratching and picking at it.  The lump is somewhat sore to touch, and starting to have some soreness around the whole neck area.  She is more sore today after getting her hair done yesterday (it was bumped into a lot). She is very worried she could have lymphoma/cancer.  PMH, PSH, SH reviewed  Current Outpatient Prescriptions on File Prior to Visit  Medication Sig Dispense Refill  . Cholecalciferol (D3-1000) 1000 units capsule Take 1,000 Units by mouth daily.    . meloxicam (MOBIC) 7.5 MG tablet Take 1 tablet (7.5 mg total) by mouth daily. (Patient not taking: Reported on 02/15/2016) 15 tablet 0   No current facility-administered medications on file prior to visit.    Allergies  Allergen Reactions  . Penicillins Hives and Itching    Has patient had a PCN reaction causing immediate rash, facial/tongue/throat swelling, SOB or lightheadedness with hypotension: Yes Has patient had a PCN reaction causing severe rash involving mucus membranes or skin necrosis: No Has patient had a PCN reaction that required hospitalization No Has patient had a PCN reaction occurring within the last 10 years: No If all of the above answers are "NO", then may proceed with Cephalosporin use.    ROS: Denies fever, chills, URI symptoms. No cats/scratches. No night sweats, weight loss, chest pain, shortness of breath, other rashes (just the eczema), GI complaints or other concerns.  PHYSICAL EXAM:  BP 112/74 (BP Location: Right Arm, Patient Position: Sitting, Cuff Size: Normal)   Pulse 60   Temp 99.1 F (37.3 C) (Oral)   Ht 5' 2.75" (1.594 m)   Wt 117 lb (53.1 kg)   BMI 20.89 kg/m   Pleasant, anxious female in no distress  1-1.25 cm mobile, tender lymph node on  posterior cervical chain on the left.  No fluctuance, no erythema There is also a nontender superficial lymph node overlying the mastoid bone on the right, just posterior to the ear.  This is <1cm in size, nontender.  She has some scabbing and excoriations behind both ears and on the right occipito-parietal scalp.  ASSESSMENT/PLAN:  Reactive cervical lymphadenopathy  Eczema, unspecified type - Plan: DISCONTINUED: mupirocin cream (BACTROBAN) 2 %   Eczema flare, with reactive lymphadenopathy. No evidence of infection at this time. Will treat with bactroban cream.  Contact us if you have increase in size/# of lymph nodes, if it is more painful, red, or if you develop any fever.  These are signs of infection for which I would prescribe antibiotic.  Right now it doesn't appear infected, just inflamed, and with reactive lymph node enlargement.  There is no evidence to suggest any lymphoma or cancer.

## 2016-02-22 ENCOUNTER — Ambulatory Visit (AMBULATORY_SURGERY_CENTER): Payer: BLUE CROSS/BLUE SHIELD | Admitting: Gastroenterology

## 2016-02-22 ENCOUNTER — Encounter: Payer: Self-pay | Admitting: Gastroenterology

## 2016-02-22 VITALS — BP 123/76 | HR 61 | Temp 97.8°F | Resp 12 | Ht 63.0 in | Wt 115.0 lb

## 2016-02-22 DIAGNOSIS — Z1212 Encounter for screening for malignant neoplasm of rectum: Secondary | ICD-10-CM | POA: Diagnosis not present

## 2016-02-22 DIAGNOSIS — Z1211 Encounter for screening for malignant neoplasm of colon: Secondary | ICD-10-CM

## 2016-02-22 DIAGNOSIS — D120 Benign neoplasm of cecum: Secondary | ICD-10-CM

## 2016-02-22 MED ORDER — SODIUM CHLORIDE 0.9 % IV SOLN: 500.0000 mL | INTRAVENOUS | Status: DC

## 2016-02-22 NOTE — Progress Notes (Signed)
Called to room to assist during endoscopic procedure.  Patient ID and intended procedure confirmed with present staff. Received instructions for my participation in the procedure from the performing physician.  

## 2016-02-22 NOTE — Patient Instructions (Signed)
YOU HAD AN ENDOSCOPIC PROCEDURE TODAY AT [facility]:   Refer to the procedure report that was given to you for any specific questions about what was found during the examination.  If the procedure report does not answer your questions, please call your gastroenterologist to clarify.  If you requested that your care partner not be given the details of your procedure findings, then the procedure report has been included in a sealed envelope for you to review at your convenience later.  YOU SHOULD EXPECT: Some feelings of bloating in the abdomen. Passage of more gas than usual.  Walking can help get rid of the air that was put into your GI tract during the procedure and reduce the bloating. If you had a lower endoscopy (such as a colonoscopy or flexible sigmoidoscopy) you may notice spotting of blood in your stool or on the toilet paper. If you underwent a bowel prep for your procedure, you may not have a normal bowel movement for a few days.  Please Note:  You might notice some irritation and congestion in your nose or some drainage.  This is from the oxygen used during your procedure.  There is no need for concern and it should clear up in a day or so.  SYMPTOMS TO REPORT IMMEDIATELY:   Following lower endoscopy (colonoscopy or flexible sigmoidoscopy):  Excessive amounts of blood in the stool  Significant tenderness or worsening of abdominal pains  Swelling of the abdomen that is new, acute  Fever of 100F or higher  For urgent or emergent issues, a gastroenterologist can be reached at any hour by calling [phone].   DIET:  We do recommend a small meal at first, but then you may proceed to your regular diet.  Drink plenty of fluids but you should avoid alcoholic beverages for 24 hours.  ACTIVITY:  You should plan to take it easy for the rest of today and you should NOT DRIVE or use heavy machinery until tomorrow (because of the sedation medicines used during the test).     FOLLOW UP: Our staff will call the number listed on your records the next business day following your procedure to check on you and address any questions or concerns that you may have regarding the information given to you following your procedure. If we do not reach you, we will leave a message.  However, if you are feeling well and you are not experiencing any problems, there is no need to return our call.  We will assume that you have returned to your regular daily activities without incident.  If any biopsies were taken you will be contacted by phone or by letter within the next 1-3 weeks.  Please call us at [phone] if you have not heard about the biopsies in 3 weeks.   Polyps (handout given) Hemorrhoids (handout given) Await Biopsy results to determined next repeat Colonoscopy probably 3 years   SIGNATURES/CONFIDENTIALITY: You and/or your care partner have signed paperwork which will be entered into your electronic medical record.  These signatures attest to the fact that that the information above on your After Visit Summary has been reviewed and is understood.  Full responsibility of the confidentiality of this discharge information lies with you and/or your care-partner.

## 2016-02-22 NOTE — Progress Notes (Signed)
Report to PACU, RN, vss, BBS= Clear.  

## 2016-02-22 NOTE — Op Note (Signed)
Deborah Meadows Patient Name: Deborah Meadows Procedure Date: 02/22/2016 10:38 AM MRN: [id number] Endoscopist: Mauri Pole , MD Age: 51 Referring MD:  Date of Birth: 1965-05-27 Gender: Female Account #: [id number] Procedure:                Colonoscopy Indications:              Screening for colorectal malignant neoplasm, This                            is the patient's first colonoscopy Medicines:                Monitored Anesthesia Care Procedure:                Pre-Anesthesia Assessment:                           - Prior to the procedure, a History and Physical                            was performed, and patient medications and                            allergies were reviewed. The patient's tolerance of                            previous anesthesia was also reviewed. The risks                            and benefits of the procedure and the sedation                            options and risks were discussed with the patient.                            All questions were answered, and informed consent                            was obtained. Prior Anticoagulants: The patient has                            taken no previous anticoagulant or antiplatelet                            agents. ASA Grade Assessment: II - A patient with                            mild systemic disease. After reviewing the risks                            and benefits, the patient was deemed in                            satisfactory condition to undergo the procedure.  After obtaining informed consent, the colonoscope                            was passed under direct vision. Throughout the                            procedure, the patient's blood pressure, pulse, and                            oxygen saturations were monitored continuously. The                            Model PCF-[id number]) scope was introduced                            through the anus  and advanced to the the terminal                            ileum, with identification of the appendiceal                            orifice and IC valve. The colonoscopy was performed                            without difficulty. The patient tolerated the                            procedure well. The quality of the bowel                            preparation was excellent. The terminal ileum,                            ileocecal valve, appendiceal orifice, and rectum                            were photographed. Scope In: 10:50:11 AM Scope Out: 11:02:07 AM Scope Withdrawal Time: 0 hours 7 minutes 50 seconds  Total Procedure Duration: 0 hours 11 minutes 56 seconds  Findings:                 The perianal and digital rectal examinations were                            normal.                           A 13 mm polyp was found in the cecum. The polyp was                            semi-pedunculated. The polyp was removed with a hot                            snare. Resection and retrieval were complete.  Non-bleeding internal hemorrhoids were found during                            retroflexion. The hemorrhoids were small.                           The exam was otherwise without abnormality. Complications:            No immediate complications. Estimated Blood Loss:     Estimated blood loss: none. Impression:               - One 13 mm polyp in the cecum, removed with a hot                            snare. Resected and retrieved.                           - Non-bleeding internal hemorrhoids.                           - The examination was otherwise normal. Recommendation:           - Patient has a contact number available for                            emergencies. The signs and symptoms of potential                            delayed complications were discussed with the                            patient. Return to normal activities tomorrow.                             Written discharge instructions were provided to the                            patient.                           - Resume previous diet.                           - Continue present medications.                           - Await pathology results.                           - Repeat colonoscopy in 3 years for surveillance                            based on pathology results.                           - Return to GI clinic PRN. Mauri Pole, MD 02/22/2016 11:08:58 AM This report has been signed electronically.

## 2016-02-23 ENCOUNTER — Telehealth: Payer: Self-pay

## 2016-02-23 NOTE — Telephone Encounter (Signed)
  Follow up Call-  Call back number 02/22/2016  Post procedure Call Back phone  # [phone]  Permission to leave phone message Yes  Some recent data might be hidden    Patient was called for follow up after her procedure on 02/22/2016. No answer at the number that was given for follow up phone call. A message was left on the answering machine.

## 2016-02-23 NOTE — Telephone Encounter (Signed)
  Follow up Call-  Call back number 02/22/2016  Post procedure Call Back phone  # [phone]  Permission to leave phone message Yes  Some recent data might be hidden     Patient was called for follow up after her procedure on 02/22/2016. No answer at the number given for follow up phone call. A message was left on the answering machine.

## 2016-02-29 ENCOUNTER — Ambulatory Visit: Payer: BLUE CROSS/BLUE SHIELD

## 2016-03-01 ENCOUNTER — Ambulatory Visit
Admission: RE | Admit: 2016-03-01 | Discharge: 2016-03-01 | Disposition: A | Discharge status: Home / Self Care | Payer: BLUE CROSS/BLUE SHIELD | Source: Ambulatory Visit | Attending: Women's Health | Admitting: Women's Health

## 2016-03-01 ENCOUNTER — Encounter: Payer: Self-pay | Admitting: Women's Health

## 2016-03-01 ENCOUNTER — Encounter: Payer: BLUE CROSS/BLUE SHIELD | Admitting: Women's Health

## 2016-03-01 DIAGNOSIS — Z1231 Encounter for screening mammogram for malignant neoplasm of breast: Secondary | ICD-10-CM

## 2016-03-04 ENCOUNTER — Encounter: Payer: Self-pay | Admitting: Gastroenterology

## 2016-03-06 ENCOUNTER — Ambulatory Visit (INDEPENDENT_AMBULATORY_CARE_PROVIDER_SITE_OTHER): Payer: BLUE CROSS/BLUE SHIELD | Admitting: Women's Health

## 2016-03-06 ENCOUNTER — Encounter: Payer: Self-pay | Admitting: Women's Health

## 2016-03-06 VITALS — BP 111/78 | Ht 62.0 in | Wt 116.6 lb

## 2016-03-06 DIAGNOSIS — Z01419 Encounter for gynecological examination (general) (routine) without abnormal findings: Secondary | ICD-10-CM | POA: Diagnosis not present

## 2016-03-06 DIAGNOSIS — Z1151 Encounter for screening for human papillomavirus (HPV): Secondary | ICD-10-CM

## 2016-03-06 NOTE — Progress Notes (Signed)
Deborah Meadows 24-Feb-1965 [id number]    History:    Presents for annual exam.  Amenorrheic since 06/2015. 06/2015 D and C4 benign endometrial polyp and ablation. Normal Pap and mammogram history. 02/2016 colonoscopy 13 mm adenoma has 3 year follow-up. Labs at primary care.  Past medical history, past surgical history, family history and social history were all reviewed and documented in the EPIC chart. Mother hypertension, sister thyroid cancer. Separated. Daughters 83 and 29, Ellard Artis 12 doing well.  ROS:  A ROS was performed and pertinent positives and negatives are included.  Exam:  Vitals:   03/06/16 1406  BP: 111/78  Weight: 116 lb 9.6 oz (52.9 kg)  Height: 5\' 2"  (1.575 m)   Body mass index is 21.33 kg/m.   General appearance:  Normal Thyroid:  Symmetrical, normal in size, without palpable masses or nodularity. Respiratory  Auscultation:  Clear without wheezing or rhonchi Cardiovascular  Auscultation:  Regular rate, without rubs, murmurs or gallops  Edema/varicosities:  Not grossly evident Abdominal  Soft,nontender, without masses, guarding or rebound.  Liver/spleen:  No organomegaly noted  Hernia:  None appreciated  Skin  Inspection:  Grossly normal   Breasts: Examined lying and sitting.     Right: Without masses, retractions, discharge or axillary adenopathy.     Left: Without masses, retractions, discharge or axillary adenopathy. Gentitourinary   Inguinal/mons:  Normal without inguinal adenopathy  External genitalia:  Normal  BUS/Urethra/Skene's glands:  Normal  Vagina:  Normal  Cervix:  Normal  Uterus:   normal in size, shape and contour.  Midline and mobile  Adnexa/parametria:     Rt: Without masses or tenderness.   Lt: Without masses or tenderness.  Anus and perineum: Normal  Digital rectal exam: Normal sphincter tone without palpated masses or tenderness  Assessment/Plan:  51 y.o. separated WF G3 6 P3 for annual exam with no complaints.  Amenorrheic since  06/2015/ minimal symptoms  06/2015 endometrial polyp removed and ablation 02/2016 benign adenoma on colonoscopy Labs-primary care  Plan: SBE's, continue annual 3-D screening mammogram history of dense breast. Continue active lifestyle, healthy exercise routine. Continue vitamin D supplement. UA, Pap with HR HPV typing, new screening guidelines reviewed.  Huel Cote [facility], 3:03 PM 03/06/2016

## 2016-03-06 NOTE — Patient Instructions (Signed)

## 2016-03-08 ENCOUNTER — Encounter: Payer: Self-pay | Admitting: Women's Health

## 2016-03-08 LAB — PAP, TP IMAGING W/ HPV RNA, RFLX HPV TYPE 16,18/45: HPV MRNA, HIGH RISK: NOT DETECTED

## 2016-03-28 DIAGNOSIS — L718 Other rosacea: Secondary | ICD-10-CM | POA: Diagnosis not present

## 2016-03-28 DIAGNOSIS — L218 Other seborrheic dermatitis: Secondary | ICD-10-CM | POA: Diagnosis not present

## 2016-03-28 DIAGNOSIS — L308 Other specified dermatitis: Secondary | ICD-10-CM | POA: Diagnosis not present

## 2016-04-10 ENCOUNTER — Ambulatory Visit (INDEPENDENT_AMBULATORY_CARE_PROVIDER_SITE_OTHER): Payer: BLUE CROSS/BLUE SHIELD | Admitting: Family Medicine

## 2016-04-10 ENCOUNTER — Encounter: Payer: Self-pay | Admitting: Family Medicine

## 2016-04-10 VITALS — BP 128/84 | HR 68 | Temp 98.1°F | Ht 62.75 in | Wt 113.4 lb

## 2016-04-10 DIAGNOSIS — R221 Localized swelling, mass and lump, neck: Secondary | ICD-10-CM | POA: Diagnosis not present

## 2016-04-10 NOTE — Patient Instructions (Signed)
We are referring you for ultrasound of the neck.  This will take a look at the thyroid, as well as surrounding structures.  This will tell us what the visible lump is that we see (cyst vs lymph node vs thyroid nodule, etc).   Overall, it feels benign, and nothing to worry about (well circumscribed, mobile and not fixed). We will let you know the next step once we get these results.  Be sure to wear "broad spectrum" sunscreen (covering UV A and B rays) as we discussed. I'm glad that the allergic reaction/rash has resolved.

## 2016-04-10 NOTE — Progress Notes (Signed)
Chief Complaint  Patient presents with  . Edema    swelling in her neck that she nack that she can see.     2 weeks ago she saw dermatologist ([facility])--she had neck swelling and redness.  She saw Dr. Derrel Nip at [facility] derm.  She said it was a topical allergic reaction to something--they couldn't figure out to what. She was treated with topical steroid cream (that had been prescribed for eczema), and the rash resolved.  She described it as a redness on both sides of her neck, with a central clearing under the chin. She had noticed that when it was sunny and hot, the sun from the car window flares up the issue on the neck--it would get red, burn.  Rash stopped about a week ago.  She was last seen with swollen lymph nodes.  She reports that these have completely resolved.  She is here today because she sees something swollen in the front of her neck Thinks it has been there 10d--noticed since inspecting her neck daily for the rash. She notices it move up and down with swallowing, and she can move it around (pushing it).   No changes to energy, hair/skin/bowels.  Some weight loss noted. 110.7# at home today--she was alarmed at this weight loss. Denies change in diet, continues to eat healthy  Sister with thyroid cancer (in her early 47's). TSH last checked at her routine physical in August.  PMH, [facility], [facility], [facility], [facility] reviewed  Outpatient Encounter Prescriptions as of 04/10/2016  Medication Sig  . Cholecalciferol (D3-1000) 1000 units capsule Take 1,000 Units by mouth daily.  . meloxicam (MOBIC) 7.5 MG tablet Take 1 tablet (7.5 mg total) by mouth daily. (Patient not taking: Reported on 02/22/2016)  . mupirocin ointment (BACTROBAN) 2 % Place 1 application into the nose 3 (three) times daily.   Facility-Administered Encounter Medications as of 04/10/2016  Medication  . 0.9 %  sodium chloride infusion   Allergies: PCN  ROS:  No fever, chills, URI symptoms, hair/bowel/mood changes.  +weight loss.   Energy normal. No chest pain, shortness of breath.  Swollen glands resolved. No dysphagia or other concerns, except as noted in HPI.  PHYSICAL EXAM:  BP 128/84 (BP Location: Left Arm, Patient Position: Sitting, Cuff Size: Normal)   Pulse 68   Temp 98.1 F (36.7 C) (Tympanic)   Ht 5' 2.75" (1.594 m)   Wt 113 lb 6.4 oz (51.4 kg)   BMI 20.25 kg/m   Well appearing female in no distress There is a visible abnormality noted in central anterior neck, that appears to move when swallowing Skin is normal--no erythema or rash Neck: no lymphadenopathy posteriorly (resolved). Anteriorly, there is a soft, mobile approx 1cm mobile, superficial mass.  Thyroid feels normal.  Mobile, soft mass is just to the left of midline. Heart: regular rate and rhythm Lungs: clear Psych: anxious/excitable female, in good spirits Neuro: alert and oriented, cranial nerves intact, normal gait  ASSESSMENT/PLAN:  Neck mass - Plan: US Soft Tissue Head/Neck    Differential diagnosis discussed (thyroid nodule--though feels different), lymph node, cyst (such as thyroglossal duct). Given that it feels separate from her thyroid at this time, not checking TSH today. If there is a thyroid nodule noted on [place], will have her return for TSH (to determine if scan is needed)

## 2016-04-12 ENCOUNTER — Ambulatory Visit
Admission: RE | Admit: 2016-04-12 | Discharge: 2016-04-12 | Disposition: A | Discharge status: Home / Self Care | Payer: BLUE CROSS/BLUE SHIELD | Source: Ambulatory Visit | Attending: Family Medicine | Admitting: Family Medicine

## 2016-04-12 DIAGNOSIS — R221 Localized swelling, mass and lump, neck: Secondary | ICD-10-CM

## 2016-04-12 DIAGNOSIS — E041 Nontoxic single thyroid nodule: Secondary | ICD-10-CM | POA: Diagnosis not present

## 2016-06-26 ENCOUNTER — Encounter: Payer: Self-pay | Admitting: Gynecology

## 2016-07-11 DIAGNOSIS — H5213 Myopia, bilateral: Secondary | ICD-10-CM | POA: Diagnosis not present

## 2016-07-11 DIAGNOSIS — D2311 Other benign neoplasm of skin of right eyelid, including canthus: Secondary | ICD-10-CM | POA: Diagnosis not present

## 2016-07-11 DIAGNOSIS — H524 Presbyopia: Secondary | ICD-10-CM | POA: Diagnosis not present

## 2016-10-08 NOTE — Progress Notes (Signed)
Chief Complaint  Patient presents with  . Annual Exam    fasting CPE without pap, sees [name], NP and is UTD. Just had eye exam with Dr. Prudencio Burly twice a year [place]. Feels like she is a constant state of PMS/menopause.     Deborah Meadows is a 51 y.o. female who presents for a complete physical.  She had GYN visit in 02/2016. The year prior, she had D&C, hysteroscopy, Myosure ablation in  with Dr. Toney Rakes (06/2015).  She had a benign endometrial polyp. She has the following concerns:  Reports she is in a "constant state of PMS vs menopause".  Mild hot flashes, impatient, irritable, bloating, almost feels like she is ovulating, breast soreness. No cycle since prior to her ablation.  Seen in February with complaint of neck mass. She denies any change. Soft tissue ultrasound was performed 04/2016 which showed: IMPRESSION: 1. The palpable abnormality corresponds with a 1 cm benign cyst arising exophytic from the left aspect the thyroid isthmus. While this lesion requires no dedicated imaging follow-up, or biopsy, aspiration could be performed if clinically warranted for symptomatic or cosmetic relief. 2. Additional scattered bilateral tiny thyroid nodules which do not meet criteria for either imaging follow-up or biopsy.  Vitamin D deficiency was noted last year, with a level of 26.  She was treated with 8 weeks of weekly rx therapy, and then advised to take a daily supplement.  She has been taking OTC Vitamin D3 1000, taking 1-2/day.  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 Aveeno eczema cream works well. She had a rash on her neck, was prescribed a steroid cream by her dermatologist.  Uses the steroid cream occasionally for her eczema on her ears. (Dr. Derrel Nip)  Rosacea:  Just redness on the cheeks, no breakouts.  Worse in the summer. Has never been treated.   Immunization History  Administered Date(s) Administered  . Influenza Split 11/01/2010  . Influenza,inj,Quad  PF,6+ Mos 11/02/2012, 10/05/2015  . Tdap 11/02/2012   Last Pap smear: 02/2016 normal Last mammogram: 02/2016 Last colonoscopy: 02/2016, Dr. Silverio Decamp; 36m tubular adenoma; repeat recommended 02/2019 Last DEXA: never Dentist: over 2 years.  Flosses and brushes daily.  Ophtho: yearly in May Exercise: 30-60 minutes of walking at the park at least 3-5x/week. Walks with hand weights (and does bicep/tricep exercises). Lipid screen: Lab Results  Component Value Date   CHOL 258 (H) 10/05/2015   HDL 156 10/05/2015   LDLCALC 91 10/05/2015   TRIG 57 10/05/2015   CHOLHDL 1.7 10/05/2015   No diet changes except some recent chips. She would like to have this checked again.   ROS: The patient denies anorexia, fever, weight changes, headaches, vision changes, decreased hearing, ear pain, sore throat, breast concerns, chest pain, palpitations, dizziness, syncope, dyspnea on exertion, cough, swelling, nausea, vomiting, diarrhea, constipation, abdominal pain, melena, hematochezia, indigestion/heartburn, hematuria, incontinence, dysuria, vaginal bleeding, discharge, odor or itch, genital lesions, joint pains, numbness, tingling, weakness, tremor, suspicious skin lesions, depression, anxiety, abnormal bleeding/bruising, or enlarged lymph nodes. Stress incontinence with sneezing. Breasts are both currently tender, with some bloating--feels pre-menstrual. Doesn't have cycles due to ablation.  Only mild hot flashes. Irritability per HPI. Some chronic postnasal drainage.   PHYSICAL EXAM:  BP 126/72 (BP Location: Left Arm, Patient Position: Sitting, Cuff Size: Normal)   Pulse 64   Ht 5' 2.75" (1.594 m)   Wt 119 lb 12.8 oz (54.3 kg)   BMI 21.39 kg/m   Wt Readings from Last  3 Encounters:  10/09/16 119 lb 12.8 oz (54.3 kg)  04/10/16 113 lb 6.4 oz (51.4 kg)  03/06/16 116 lb 9.6 oz (52.9 kg)    General Appearance:  Alert, cooperative, no distress, appears stated age. Occasional throat-clearing.  Head:   Normocephalic, without obvious abnormality, atraumatic  Eyes:  PERRL, conjunctiva/corneas clear, EOM's intact, fundi benign  Ears:  Normal TM's and external ear canals.  Nose: Nares normal, mucosa normal, no drainage or sinus tenderness  Throat: Lips, mucosa, and tongue normal; teeth and gums normal  Neck: Supple, no lymphadenopathy; thyroid: no enlargement/tenderness/nodules; no carotid bruit or JVD  Back:  Spine nontender, no curvature, ROM normal, no CVA tenderness  Lungs:  Clear to auscultation bilaterally without wheezes, rales or ronchi; respirations unlabored  Chest Wall:  No tenderness or deformity  Heart:  Regular rate and rhythm, S1 and S2 normal, no murmur, rub or gallop  Breast Exam:  Deferred to GYN  Abdomen:  Soft, non-tender, nondistended, normoactive bowel sounds, no masses, no hepatosplenomegaly  Genitalia:  Deferred to GYN     Extremities: No clubbing, cyanosis or edema  Pulses: 2+ and symmetric all extremities  Skin: Skin color, texture, turgor normal. Cheeks are slightly pink in the lower cheeks  Lymph nodes: Cervical, supraclavicular, and axillary nodes normal  Neurologic: CNII-XII intact, normal strength, sensation and gait; reflexes 2+ and symmetric throughout  Psych: Normal mood, affect, hygiene and grooming    ASSESSMENT/PLAN:  Annual physical exam - Plan: Lipid panel, Comprehensive metabolic panel, CBC with Differential/Platelet, TSH, VITAMIN D 25 Hydroxy (Vit-D Deficiency, Fractures)  Need for influenza vaccination - Plan: Flu Vaccine QUAD 6+ mos PF IM (Fluarix Quad PF)  Vitamin D deficiency - recheck level, compliant with daily supplement - Plan: VITAMIN D 25 Hydroxy (Vit-D Deficiency, Fractures)  Cyst of thyroid determined by ultrasound - simple cyst, stable size, asymptomatic. re-eval (for poss drainage) if increasing in size/symptomatic. Reassured - Plan: TSH   Flu shot given, risks  reviewed. Vit D was recommended to be repeated today; other labs not necessarily.  She would like full panel CBC, c-met, TSH, lipid and D  Discussed monthly self breast exams and yearly mammograms; at least 30 minutes of aerobic activity at least 5 days/week, weight-bearing exercise at least 2x/week; proper sunscreen use reviewed; healthy diet, including goals of calcium and vitamin D intake and alcohol recommendations (less than or equal to 1 drink/day) reviewed; regular seatbelt use; changing batteries in smoke detectors. Immunization recommendations discussed--flu shot today. Shingrix discussed. Colonoscopy recommendations reviewed--3 yr f/u due 02/2019  F/u 1 year, sooner prn

## 2016-10-09 ENCOUNTER — Encounter: Payer: Self-pay | Admitting: Family Medicine

## 2016-10-09 ENCOUNTER — Ambulatory Visit (INDEPENDENT_AMBULATORY_CARE_PROVIDER_SITE_OTHER): Payer: BLUE CROSS/BLUE SHIELD | Admitting: Family Medicine

## 2016-10-09 VITALS — BP 126/72 | HR 64 | Ht 62.75 in | Wt 119.8 lb

## 2016-10-09 DIAGNOSIS — E041 Nontoxic single thyroid nodule: Secondary | ICD-10-CM | POA: Diagnosis not present

## 2016-10-09 DIAGNOSIS — Z23 Encounter for immunization: Secondary | ICD-10-CM | POA: Diagnosis not present

## 2016-10-09 DIAGNOSIS — E559 Vitamin D deficiency, unspecified: Secondary | ICD-10-CM | POA: Diagnosis not present

## 2016-10-09 DIAGNOSIS — Z0000 Encounter for general adult medical examination without abnormal findings: Secondary | ICD-10-CM | POA: Diagnosis not present

## 2016-10-09 LAB — CBC WITH DIFFERENTIAL/PLATELET
BASOS PCT: 0 %
Basophils Absolute: 0 cells/uL (ref 0–200)
EOS ABS: 49 {cells}/uL (ref 15–500)
Eosinophils Relative: 1 %
HEMATOCRIT: 37.7 % (ref 35.0–45.0)
HEMOGLOBIN: 12.8 g/dL (ref 11.7–15.5)
LYMPHS PCT: 28 %
Lymphs Abs: 1372 cells/uL (ref 850–3900)
MCH: 32.8 pg (ref 27.0–33.0)
MCHC: 34 g/dL (ref 32.0–36.0)
MCV: 96.7 fL (ref 80.0–100.0)
MONO ABS: 441 {cells}/uL (ref 200–950)
MPV: 9.8 fL (ref 7.5–12.5)
Monocytes Relative: 9 %
NEUTROS PCT: 62 %
Neutro Abs: 3038 cells/uL (ref 1500–7800)
Platelets: 258 10*3/uL (ref 140–400)
RBC: 3.9 MIL/uL (ref 3.80–5.10)
RDW: 12.6 % (ref 11.0–15.0)
WBC: 4.9 10*3/uL (ref 4.0–10.5)

## 2016-10-09 NOTE — Patient Instructions (Signed)
  HEALTH MAINTENANCE RECOMMENDATIONS:  It is recommended that you get at least 30 minutes of aerobic exercise at least 5 days/week (for weight loss, you may need as much as 60-90 minutes). This can be any activity that gets your heart rate up. This can be divided in 10-15 minute intervals if needed, but try and build up your endurance at least once a week.  Weight bearing exercise is also recommended twice weekly.  Eat a healthy diet with lots of vegetables, fruits and fiber.  "Colorful" foods have a lot of vitamins (ie green vegetables, tomatoes, red peppers, etc).  Limit sweet tea, regular sodas and alcoholic beverages, all of which has a lot of calories and sugar.  Up to 1 alcoholic drink daily may be beneficial for women (unless trying to lose weight, watch sugars).  Drink a lot of water.  Calcium recommendations are 1200-1500 mg daily (1500 mg for postmenopausal women or women without ovaries), and vitamin D 1000 IU daily.  This should be obtained from diet and/or supplements (vitamins), and calcium should not be taken all at once, but in divided doses.  Monthly self breast exams and yearly mammograms for women over the age of 23 is recommended.  Sunscreen of at least SPF 30 should be used on all sun-exposed parts of the skin when outside between the hours of 10 am and 4 pm (not just when at beach or pool, but even with exercise, golf, tennis, and yard work!)  Use a sunscreen that says "broad spectrum" so it covers both UVA and UVB rays, and make sure to reapply every 1-2 hours.  Remember to change the batteries in your smoke detectors when changing your clock times in the spring and fall.  Use your seat belt every time you are in a car, and please drive safely and not be distracted with cell phones and texting while driving.  I recommend getting the new shingles vaccine (Shingrix). You will need to check with your insurance to see if it is covered.  It is a series of 2 injections, spaced 2  months apart. There is currently a manufacturer's backorder.  Check back when you start seeing commercials on TV or next year (Jan/Feb).  Check with your insurance prior to scheduling a nurse visit to get the vaccine, to ensure that it is covered.

## 2016-10-10 LAB — LIPID PANEL
CHOL/HDL RATIO: 2.1 ratio (ref <5.0)
CHOLESTEROL: 221 mg/dL — AB (ref <200)
HDL: 104 mg/dL (ref >50)
LDL Cholesterol: 102 mg/dL — ABNORMAL HIGH (ref <100)
TRIGLYCERIDES: 77 mg/dL (ref <150)
VLDL: 15 mg/dL (ref <30)

## 2016-10-10 LAB — COMPREHENSIVE METABOLIC PANEL
ALBUMIN: 4.4 g/dL (ref 3.6–5.1)
ALT: 10 U/L (ref 6–29)
AST: 21 U/L (ref 10–35)
Alkaline Phosphatase: 43 U/L (ref 33–130)
BUN: 8 mg/dL (ref 7–25)
CALCIUM: 8.9 mg/dL (ref 8.6–10.4)
CHLORIDE: 97 mmol/L — AB (ref 98–110)
CO2: 22 mmol/L (ref 20–32)
CREATININE: 0.55 mg/dL (ref 0.50–1.05)
Glucose, Bld: 93 mg/dL (ref 65–99)
POTASSIUM: 4.2 mmol/L (ref 3.5–5.3)
Sodium: 133 mmol/L — ABNORMAL LOW (ref 135–146)
TOTAL PROTEIN: 6.8 g/dL (ref 6.1–8.1)
Total Bilirubin: 0.9 mg/dL (ref 0.2–1.2)

## 2016-10-10 LAB — VITAMIN D 25 HYDROXY (VIT D DEFICIENCY, FRACTURES): Vit D, 25-Hydroxy: 31 ng/mL (ref 30–100)

## 2016-10-10 LAB — TSH: TSH: 2.13 mIU/L

## 2017-01-06 ENCOUNTER — Encounter: Payer: Self-pay | Admitting: Family Medicine

## 2017-01-06 ENCOUNTER — Ambulatory Visit: Payer: BLUE CROSS/BLUE SHIELD | Admitting: Family Medicine

## 2017-01-06 VITALS — BP 130/70 | HR 96 | Temp 98.6°F | Wt 119.6 lb

## 2017-01-06 DIAGNOSIS — J309 Allergic rhinitis, unspecified: Secondary | ICD-10-CM

## 2017-01-06 DIAGNOSIS — H6983 Other specified disorders of Eustachian tube, bilateral: Secondary | ICD-10-CM

## 2017-01-06 DIAGNOSIS — J069 Acute upper respiratory infection, unspecified: Secondary | ICD-10-CM | POA: Diagnosis not present

## 2017-01-06 NOTE — Patient Instructions (Signed)
  I see no evidence of bacterial infection.  I suspect that the drainage down the throat and the ear plugging is related to allergies (versus residual from the cold). The ear discomfort is likely from eustachian tube dysfunction--there is no fluid or infection noted.  If you develop fever and worsening pain, return for recheck.  Decongestants such as sudafed should help dry up the drainage and help with your ear discomfort.  If this might be allergies, then a medication such as claritin or allegra or zyrtec would help, versus using a nasal steroid spray such as Flonase.

## 2017-01-06 NOTE — Progress Notes (Signed)
Chief Complaint  Patient presents with  . URI    Reports onset first of November. Ear pain noted per pt. Has tried Mucinex with no relief.    She thought she had the flu, starting 11/1.  She didn't have fever, just was very achey, fatigued, and had URI symptoms.  Myalgias resolved within the first week.  Postnasal drainage, choking cough lasted 2 weeks. Something is still "in her throat", but no longer colored.  It was discolored initially.  She is still having postnasal drainage and she is complaining of bilateral ear pain.  Described as an ache, like she needs to pop them but can't.  Ear pain has been present for 2 weeks, sometimes not as bad as others.  Son had a cold for 2 weeks, prior to her illness, no other sick contacts. Denies that she has allergies, but reports she "probably does". Doesn't take anything for it.  She is taking mucinex (12 hour, plain). No other OTC medications.  PMH, PSH, SH reviewed  Outpatient Encounter Medications as of 01/06/2017  Medication Sig Note  . Cholecalciferol (D3-1000) 1000 units capsule Take 1,000 Units by mouth daily. 10/09/2016: Takes 02-1998 IU daily   Facility-Administered Encounter Medications as of 01/06/2017  Medication  . 0.9 %  sodium chloride infusion   Allergies  Allergen Reactions  . Penicillins Hives and Itching    Has patient had a PCN reaction causing immediate rash, facial/tongue/throat swelling, SOB or lightheadedness with hypotension: Yes Has patient had a PCN reaction causing severe rash involving mucus membranes or skin necrosis: No Has patient had a PCN reaction that required hospitalization No Has patient had a PCN reaction occurring within the last 10 years: No If all of the above answers are "NO", then may proceed with Cephalosporin use.    ROS: Denies shortness of breath, wheezing.  No fever, chills, nausea, vomiting, diarrhea. +sore throat from drainage. Facial rash x 1 year, has seen derm, periodically flares and uses  eczema cream prn.  No other rashes.  PHYSICAL EXAM:  BP 130/70   Pulse 96   Temp 98.6 F (37 C) (Oral)   Wt 119 lb 9.6 oz (54.3 kg)   BMI 21.36 kg/m   Well appearing, pleasant female, with occasional throat-clearing. HEENT: PERRL, EOMI, conjunctiva and sclera clear.  Nasal mucosa is mildly edematous, pale, no mucus noted. Sinuses nontender. TM's and EAC's normal. OP is clear Neck: no lymphadenopathy or mass Heart: regular rate and rhythm, no murmur Lungs: clear bilaterally, no wheezes, rales, ronchi Extremities: no edema Skin : normal turgor, no rash Psych: normal mood, affect, hygiene and grooming Neuro: alert and oriented, cranial nerves intact, normal gait   ASSESSMENT/PLAN:   Eustachian tube dysfunction, bilateral - decongestants +/- allergy meds; reassured no e/o infection  Viral upper respiratory infection - improving, with residual ETD, PND, and possible mild allergies. Supportive measures reviewed  Allergic rhinitis, unspecified seasonality, unspecified trigger    I see no evidence of bacterial infection.  I suspect that the drainage down the throat and the ear plugging is related to allergies (versus residual from the cold). The ear discomfort is likely from eustachian tube dysfunction--there is no fluid or infection noted.  If you develop fever and worsening pain, return for recheck.  Decongestants such as sudafed should help dry up the drainage and help with your ear discomfort.  If this might be allergies, then a medication such as claritin or allegra or zyrtec would help, versus using a nasal steroid spray such  as Flonase.

## 2017-01-30 ENCOUNTER — Other Ambulatory Visit: Payer: Self-pay | Admitting: Women's Health

## 2017-01-30 DIAGNOSIS — Z1231 Encounter for screening mammogram for malignant neoplasm of breast: Secondary | ICD-10-CM

## 2017-03-03 ENCOUNTER — Ambulatory Visit: Payer: BLUE CROSS/BLUE SHIELD

## 2017-03-11 ENCOUNTER — Ambulatory Visit: Payer: BLUE CROSS/BLUE SHIELD | Admitting: Women's Health

## 2017-03-11 ENCOUNTER — Encounter: Payer: Self-pay | Admitting: Women's Health

## 2017-03-11 VITALS — BP 120/78 | Ht 63.0 in

## 2017-03-11 DIAGNOSIS — Z01419 Encounter for gynecological examination (general) (routine) without abnormal findings: Secondary | ICD-10-CM | POA: Diagnosis not present

## 2017-03-11 DIAGNOSIS — R87610 Atypical squamous cells of undetermined significance on cytologic smear of cervix (ASC-US): Secondary | ICD-10-CM | POA: Diagnosis not present

## 2017-03-11 NOTE — Addendum Note (Signed)
Addended by: Lorine Bears on: 03/11/2017 04:29 PM   Modules accepted: Orders

## 2017-03-11 NOTE — Progress Notes (Signed)
Deborah Meadows [birth year] [id number]    History:    Presents for annual exam.  Postmenopausal on no HRT with no bleeding. Not sexually active. Normal Pap and mammogram history. Requests annual Pap.01/2017 colonoscopy adenoma 3 year follow-up.   Past medical history, past surgical history, family history and social history were all reviewed and documented in the EPIC chart.Works at [employer].  Daughters are 34 and 49 , son 20   Sister Thyroid cancer.  ROS:  A ROS was performed and pertinent positives and negatives are included.  Exam:  Vitals:   03/11/17 1542  BP: 120/78  Height: 5\' 3"  (1.6 m)   Body mass index is 21.19 kg/m.   General appearance:  Normal Thyroid:  Symmetrical, normal in size, without palpable masses or nodularity. Respiratory  Auscultation:  Clear without wheezing or rhonchi Cardiovascular  Auscultation:  Regular rate, without rubs, murmurs or gallops  Edema/varicosities:  Not grossly evident Abdominal  Soft,nontender, without masses, guarding or rebound.  Liver/spleen:  No organomegaly noted  Hernia:  None appreciated  Skin  Inspection:  Grossly normal   Breasts: Examined lying and sitting.     Right: Without masses, retractions, discharge or axillary adenopathy.     Left: Without masses, retractions, discharge or axillary adenopathy. Gentitourinary   Inguinal/mons:  Normal without inguinal adenopathy  External genitalia:  Normal  BUS/Urethra/Skene's glands:  Normal  Vagina:  Normal  Cervix:  Normal  Uterus:  normal in size, shape and contour.  Midline and mobile  Adnexa/parametria:     Rt: Without masses or tenderness.   Lt: Without masses or tenderness.  Anus and perineum: Normal  Digital rectal exam: Normal sphincter tone without palpated masses or tenderness  Assessment/Plan:  52 y.o. MWF G6P3 for annual exam with no complaints.  Postmenopausal/no HRT/no bleeding Primary care-labs  Plan: SBE's, continue annual screening  mammogram,healthy lifestyle, regular exercise, calcium rich foods, vit D 2000 daily encouraged.  Pap per request, normal Pap 2018.    [place], 4:22 PM 03/11/2017

## 2017-03-11 NOTE — Patient Instructions (Signed)
Health Maintenance for Postmenopausal Women Menopause is a normal process in which your reproductive ability comes to an end. This process happens gradually over a span of months to years, usually between the ages of 22 and 9. Menopause is complete when you have missed 12 consecutive menstrual periods. It is important to talk with your health care provider about some of the most common conditions that affect postmenopausal women, such as heart disease, cancer, and bone loss (osteoporosis). Adopting a healthy lifestyle and getting preventive care can help to promote your health and wellness. Those actions can also lower your chances of developing some of these common conditions. What should I know about menopause? During menopause, you may experience a number of symptoms, such as:  Moderate-to-severe hot flashes.  Night sweats.  Decrease in sex drive.  Mood swings.  Headaches.  Tiredness.  Irritability.  Memory problems.  Insomnia.  Choosing to treat or not to treat menopausal changes is an individual decision that you make with your health care provider. What should I know about hormone replacement therapy and supplements? Hormone therapy products are effective for treating symptoms that are associated with menopause, such as hot flashes and night sweats. Hormone replacement carries certain risks, especially as you become older. If you are thinking about using estrogen or estrogen with progestin treatments, discuss the benefits and risks with your health care provider. What should I know about heart disease and stroke? Heart disease, heart attack, and stroke become more likely as you age. This may be due, in part, to the hormonal changes that your body experiences during menopause. These can affect how your body processes dietary fats, triglycerides, and cholesterol. Heart attack and stroke are both medical emergencies. There are many things that you can do to help prevent heart disease  and stroke:  Have your blood pressure checked at least every 1-2 years. High blood pressure causes heart disease and increases the risk of stroke.  If you are 53-22 years old, ask your health care provider if you should take aspirin to prevent a heart attack or a stroke.  Do not use any tobacco products, including cigarettes, chewing tobacco, or electronic cigarettes. If you need help quitting, ask your health care provider.  It is important to eat a healthy diet and maintain a healthy weight. ? Be sure to include plenty of vegetables, fruits, low-fat dairy products, and lean protein. ? Avoid eating foods that are high in solid fats, added sugars, or salt (sodium).  Get regular exercise. This is one of the most important things that you can do for your health. ? Try to exercise for at least 150 minutes each week. The type of exercise that you do should increase your heart rate and make you sweat. This is known as moderate-intensity exercise. ? Try to do strengthening exercises at least twice each week. Do these in addition to the moderate-intensity exercise.  Know your numbers.Ask your health care provider to check your cholesterol and your blood glucose. Continue to have your blood tested as directed by your health care provider.  What should I know about cancer screening? There are several types of cancer. Take the following steps to reduce your risk and to catch any cancer development as early as possible. Breast Cancer  Practice breast self-awareness. ? This means understanding how your breasts normally appear and feel. ? It also means doing regular breast self-exams. Let your health care provider know about any changes, no matter how small.  If you are 40  or older, have a clinician do a breast exam (clinical breast exam or CBE) every year. Depending on your age, family history, and medical history, it may be recommended that you also have a yearly breast X-ray (mammogram).  If you  have a family history of breast cancer, talk with your health care provider about genetic screening.  If you are at high risk for breast cancer, talk with your health care provider about having an MRI and a mammogram every year.  Breast cancer (BRCA) gene test is recommended for women who have family members with BRCA-related cancers. Results of the assessment will determine the need for genetic counseling and BRCA1 and for BRCA2 testing. BRCA-related cancers include these types: ? Breast. This occurs in males or females. ? Ovarian. ? Tubal. This may also be called fallopian tube cancer. ? Cancer of the abdominal or pelvic lining (peritoneal cancer). ? Prostate. ? Pancreatic.  Cervical, Uterine, and Ovarian Cancer Your health care provider may recommend that you be screened regularly for cancer of the pelvic organs. These include your ovaries, uterus, and vagina. This screening involves a pelvic exam, which includes checking for microscopic changes to the surface of your cervix (Pap test).  For women ages 21-65, health care providers may recommend a pelvic exam and a Pap test every three years. For women ages 79-65, they may recommend the Pap test and pelvic exam, combined with testing for human papilloma virus (HPV), every five years. Some types of HPV increase your risk of cervical cancer. Testing for HPV may also be done on women of any age who have unclear Pap test results.  Other health care providers may not recommend any screening for nonpregnant women who are considered low risk for pelvic cancer and have no symptoms. Ask your health care provider if a screening pelvic exam is right for you.  If you have had past treatment for cervical cancer or a condition that could lead to cancer, you need Pap tests and screening for cancer for at least 20 years after your treatment. If Pap tests have been discontinued for you, your risk factors (such as having a new sexual partner) need to be  reassessed to determine if you should start having screenings again. Some women have medical problems that increase the chance of getting cervical cancer. In these cases, your health care provider may recommend that you have screening and Pap tests more often.  If you have a family history of uterine cancer or ovarian cancer, talk with your health care provider about genetic screening.  If you have vaginal bleeding after reaching menopause, tell your health care provider.  There are currently no reliable tests available to screen for ovarian cancer.  Lung Cancer Lung cancer screening is recommended for adults 69-62 years old who are at high risk for lung cancer because of a history of smoking. A yearly low-dose CT scan of the lungs is recommended if you:  Currently smoke.  Have a history of at least 30 pack-years of smoking and you currently smoke or have quit within the past 15 years. A pack-year is smoking an average of one pack of cigarettes per day for one year.  Yearly screening should:  Continue until it has been 15 years since you quit.  Stop if you develop a health problem that would prevent you from having lung cancer treatment.  Colorectal Cancer  This type of cancer can be detected and can often be prevented.  Routine colorectal cancer screening usually begins at  age 42 and continues through age 45.  If you have risk factors for colon cancer, your health care provider may recommend that you be screened at an earlier age.  If you have a family history of colorectal cancer, talk with your health care provider about genetic screening.  Your health care provider may also recommend using home test kits to check for hidden blood in your stool.  A small camera at the end of a tube can be used to examine your colon directly (sigmoidoscopy or colonoscopy). This is done to check for the earliest forms of colorectal cancer.  Direct examination of the colon should be repeated every  5-10 years until age 71. However, if early forms of precancerous polyps or small growths are found or if you have a family history or genetic risk for colorectal cancer, you may need to be screened more often.  Skin Cancer  Check your skin from head to toe regularly.  Monitor any moles. Be sure to tell your health care provider: ? About any new moles or changes in moles, especially if there is a change in a mole's shape or color. ? If you have a mole that is larger than the size of a pencil eraser.  If any of your family members has a history of skin cancer, especially at a Tyjai Matuszak age, talk with your health care provider about genetic screening.  Always use sunscreen. Apply sunscreen liberally and repeatedly throughout the day.  Whenever you are outside, protect yourself by wearing long sleeves, pants, a wide-brimmed hat, and sunglasses.  What should I know about osteoporosis? Osteoporosis is a condition in which bone destruction happens more quickly than new bone creation. After menopause, you may be at an increased risk for osteoporosis. To help prevent osteoporosis or the bone fractures that can happen because of osteoporosis, the following is recommended:  If you are 46-71 years old, get at least 1,000 mg of calcium and at least 600 mg of vitamin D per day.  If you are older than age 55 but younger than age 65, get at least 1,200 mg of calcium and at least 600 mg of vitamin D per day.  If you are older than age 54, get at least 1,200 mg of calcium and at least 800 mg of vitamin D per day.  Smoking and excessive alcohol intake increase the risk of osteoporosis. Eat foods that are rich in calcium and vitamin D, and do weight-bearing exercises several times each week as directed by your health care provider. What should I know about how menopause affects my mental health? Depression may occur at any age, but it is more common as you become older. Common symptoms of depression  include:  Low or sad mood.  Changes in sleep patterns.  Changes in appetite or eating patterns.  Feeling an overall lack of motivation or enjoyment of activities that you previously enjoyed.  Frequent crying spells.  Talk with your health care provider if you think that you are experiencing depression. What should I know about immunizations? It is important that you get and maintain your immunizations. These include:  Tetanus, diphtheria, and pertussis (Tdap) booster vaccine.  Influenza every year before the flu season begins.  Pneumonia vaccine.  Shingles vaccine.  Your health care provider may also recommend other immunizations. This information is not intended to replace advice given to you by your health care provider. Make sure you discuss any questions you have with your health care provider. Document Released: 03/22/2005  Document Revised: 08/18/2015 Document Reviewed: 11/01/2014 Elsevier Interactive Patient Education  2018 Elsevier Inc.  

## 2017-03-17 LAB — PAP IG W/ RFLX HPV ASCU

## 2017-03-17 LAB — HUMAN PAPILLOMAVIRUS, HIGH RISK: HPV DNA HIGH RISK: NOT DETECTED

## 2017-03-27 ENCOUNTER — Ambulatory Visit: Payer: BLUE CROSS/BLUE SHIELD

## 2017-03-27 ENCOUNTER — Ambulatory Visit
Admission: RE | Admit: 2017-03-27 | Discharge: 2017-03-27 | Disposition: A | Discharge status: Home / Self Care | Payer: BLUE CROSS/BLUE SHIELD | Source: Ambulatory Visit | Attending: Women's Health | Admitting: Women's Health

## 2017-03-27 DIAGNOSIS — Z1231 Encounter for screening mammogram for malignant neoplasm of breast: Secondary | ICD-10-CM | POA: Diagnosis not present

## 2017-03-28 ENCOUNTER — Encounter: Payer: Self-pay | Admitting: Women's Health

## 2017-06-21 DIAGNOSIS — J019 Acute sinusitis, unspecified: Secondary | ICD-10-CM | POA: Diagnosis not present

## 2017-07-24 DIAGNOSIS — D23121 Other benign neoplasm of skin of left upper eyelid, including canthus: Secondary | ICD-10-CM | POA: Diagnosis not present

## 2017-07-24 DIAGNOSIS — H5213 Myopia, bilateral: Secondary | ICD-10-CM | POA: Diagnosis not present

## 2017-10-12 NOTE — Progress Notes (Signed)
Chief Complaint  Patient presents with  . Annual Exam    fasting annual exam no pap-sees Deborah Meadows. Had eye exam in May. Would like to have neck looked at again.     Deborah Meadows is a 52 y.o. female who presents for a complete physical.  She has no specific concerns, other than getting the thyroid lump checked again, asking if she needs another ultrasound. See below.  Last year she reported she was in a "constant state of PMS vs menopause". She reported being impatient, irritable, bloating, almost feels like she is ovulating, breast soreness. No cycle since prior to her ablation. Breast soreness resolved. Moods are overall better. Hot flashes are at night, short-lived, no night sweats. Bloating improved since stopping eating eggs and milk about 2 months ago.  (hasn't yet replaced with additional calcium source).   February 2018 she presented with a neck Meadows, found to be a benign cyst per ultrasound. She denies any changes, pain.  IMPRESSION: 1. The palpable abnormality corresponds with a 1 cm benign cyst arising exophytic from the left aspect the thyroid isthmus. While this lesion requires no dedicated imaging follow-up, or biopsy, aspiration could be performed if clinically warranted for symptomatic or cosmetic relief. 2. Additional scattered bilateral tiny thyroid nodules which do not meet criteria for either imaging follow-up or biopsy.  It does not look or feel bigger to her.  Twice had some discomfort swallowing, while eating.  Resolved. Currently denies any difficulty swallowing or symptoms related to the lump.    Vitamin D deficiency was noted in 2017, with a level of 26.  Level was 31 last year, when she been taking OTC Vitamin D3 1000, taking 1-2/day.  She is currently taking 2 just a few times/week.  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  She had seen Dr. Derrel Meadows in the past for a rash, was prescribed a steroid cream for the rash. Uses the steroid  cream occasionally for her eczema on her ears, works well.  Rosacea: Just redness on the cheeks, no breakouts. Worse in the summer. Has never been treated. Didn't have time to watch the video at the dermatologist's office, and didn't really want involved treatment.  Immunization History  Administered Date(s) Administered  . Influenza Split 11/01/2010  . Influenza,inj,Quad PF,6+ Mos 11/02/2012, 10/05/2015, 10/09/2016  . Tdap 11/02/2012   Last Pap smear: 02/2017 ASCUS, no high risk HPV (also done 02/2016, normal and also no HR HPV' pt reports she insisted on having the pap yearly, and reports she will again insist on getting it checked yearly, despite the guidelines, since she is there and insurance pays for it) Last mammogram: 03/2017 Last colonoscopy: 02/2016, Deborah Meadows; 71mm tubular adenoma; repeat recommended 02/2019 Last DEXA: never Dentist:  Deborah Meadows in April, scheduled for November. Ophtho: yearly in May Exercise: 30-60 minutes of walking at the park at least 3-5x/week. Walks with hand weights (and does bicep/tricep exercises, some bands at the house). Lipid screen: Lab Results  Component Value Date   CHOL 221 (H) 10/09/2016   HDL 104 10/09/2016   LDLCALC 102 (H) 10/09/2016   TRIG 77 10/09/2016   CHOLHDL 2.1 10/09/2016   Past Medical History:  Diagnosis Date  . Anemia    borderline  . Blood dyscrasia    had test 18 years ago. No diagnosis  . Irregular heart beat    normal eval by Deborah Meadows in past    Past Surgical History:  Procedure Laterality Date  .  CESAREAN SECTION  1999  . DILATATION & CURETTAGE/HYSTEROSCOPY WITH MYOSURE N/A 07/04/2015   Procedure: DILATATION & CURETTAGE/HYSTEROSCOPY WITH MYOSURE;  Surgeon: Deborah Mass, Deborah Meadows;  Location: [facility];  Service: Gynecology;  Laterality: N/A;  . DILATION AND CURETTAGE OF UTERUS     x three  . HYSTEROSCOPY WITH NOVASURE N/A 07/04/2015   Procedure: Novasure ;  Surgeon: Deborah Mass, Deborah Meadows;  Location: [facility];  Service:  Gynecology;  Laterality: N/A;  . WISDOM TOOTH EXTRACTION     x2    Social History   Socioeconomic History  . Marital status: Married    Spouse name: Not on file  . Number of children: Not on file  . Years of education: Not on file  . Highest education level: Not on file  Occupational History  . Occupation: works at [employer]  . Financial resource strain: Not on file  . Food insecurity:    Worry: Not on file    Inability: Not on file  . Transportation needs:    Medical: Not on file    Non-medical: Not on file  Tobacco Use  . Smoking status: Never Smoker  . Smokeless tobacco: Never Used  Substance and Sexual Activity  . Alcohol use: Yes    Alcohol/week: 1.0 standard drinks    Types: 1 Glasses of wine per week    Comment: 1 glass per evening (approx 8 ounces) on average (sometimes none, sometimes more with social occasions)  . Drug use: No  . Sexual activity: Not Currently    Birth control/protection: Other-see comments    Comment: vasectomy,des neg  Lifestyle  . Physical activity:    Days per week: Not on file    Minutes per session: Not on file  . Stress: Not on file  Relationships  . Social connections:    Talks on phone: Not on file    Gets together: Not on file    Attends religious service: Not on file    Active member of club or organization: Not on file    Attends meetings of clubs or organizations: Not on file    Relationship status: Not on file  . Intimate partner violence:    Fear of current or ex partner: Not on file    Emotionally abused: Not on file    Physically abused: Not on file    Forced sexual activity: Not on file  Other Topics Concern  . Not on file  Social History Narrative   Married, 3 children, 3 dogs.  Oldest is at [employer]. 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    Family History  Problem Relation Age of Onset  . Hypertension Mother   . Hyperlipidemia Father   .  Cancer Sister        thyroid  . Heart disease Maternal Grandmother   . Alzheimer's disease Paternal Grandmother   . Diabetes Neg Hx   . Colon cancer Neg Hx     Outpatient Encounter Medications as of 10/15/2017  Medication Sig Note  . Cholecalciferol (D3-1000) 1000 units capsule Take 1,000 Units by mouth daily. 10/15/2017: Takes sporadically, about 2-3x/week; took 2 yesterday   No facility-administered encounter medications on file as of 10/15/2017.     Allergies  Allergen Reactions  . Penicillins Hives and Itching    Has patient had a PCN reaction causing immediate rash, facial/tongue/throat swelling, SOB or lightheadedness with hypotension: Yes Has patient had a PCN  reaction causing severe rash involving mucus membranes or skin necrosis: No Has patient had a PCN reaction that required hospitalization No Has patient had a PCN reaction occurring within the last 10 years: No If all of the above answers are "NO", then may proceed with Cephalosporin use.     ROS: The patient denies anorexia, fever, weight changes, headaches, vision changes, decreased hearing, ear pain, sore throat, breast concerns (no longer tender), chest pain, palpitations, dizziness, syncope, dyspnea on exertion, cough, swelling, nausea, vomiting, diarrhea, constipation, abdominal pain, melena, hematochezia, indigestion/heartburn, hematuria, incontinence, dysuria, vaginal bleeding, discharge, odor or itch, genital lesions, joint pains, numbness, tingling, weakness, tremor, suspicious skin lesions, depression, anxiety, abnormal bleeding/bruising, or enlarged lymph nodes. Stress incontinence with sneezing. S/p endometrial ablation, no bleeding. Only mild hot flashes. No sweats. Some chronic postnasal drainage. Reports to eating a lot of chips recently.  Didn't think she needed to worry about sodium intake, since the sodium in her blood tests (which she requests every year) is normal/low.   Doesn't monitor blood pressure  elsewhere, but has a cuff at home.    PHYSICAL EXAM:  BP 132/86   Pulse 84   Ht 5' 2.75" (1.594 m)   Wt 124 lb 12.8 oz (56.6 kg)   BMI 22.28 kg/m   154/94 on repeat by Deborah Meadows. She gets somewhat excitable  Wt Readings from Last 3 Encounters:  10/15/17 124 lb 12.8 oz (56.6 kg)  01/06/17 119 lb 9.6 oz (54.3 kg)  10/09/16 119 lb 12.8 oz (54.3 kg)    General Appearance:  Alert, cooperative, no distress, appears stated age. Occasional  throat-clearing.  Head:  Normocephalic, without obvious abnormality, atraumatic  Eyes:  PERRL, conjunctiva/corneas clear, EOM's intact, fundi benign  Ears:  Normal TM's and external ear canals.  Nose: Nares normal, mucosa normal, no drainage or sinus tenderness  Throat: Lips, mucosa, and tongue normal; teeth and gums normal  Neck: Supple, no lymphadenopathy; thyroid: nontender. There is a 1-1.5cm soft, superficial, mobile Meadows just to the left of midline over thyroid, unchanged from prior visit. No carotid bruit or JVD  Back:  Spine nontender, no curvature, ROM normal, no CVAtenderness  Lungs:  Clear to auscultation bilaterally without wheezes, rales or ronchi; respirations unlabored  Chest Wall:  No tenderness or deformity  Heart:  Regular rate and rhythm, S1 and S2 normal, no murmur, rub or gallop  Breast Exam:  Deferred to GYN  Abdomen:  Soft, non-tender, nondistended, normoactive bowel sounds, no masses, no hepatosplenomegaly  Genitalia:  Deferred to GYN     Extremities: No clubbing, cyanosis or edema  Pulses: 2+ and symmetric all extremities  Skin: Skin color, texture, turgor normal. (has makeup covering cheeks, redness that has previously been noted is NOT visible today).  Lymph nodes: Cervical, supraclavicular, and axillary nodes normal  Neurologic: CNII-XII intact, normal strength, sensation and gait; reflexes 2+ and symmetric throughout  Psych: Normal mood, affect, hygiene and  grooming    ASSESSMENT/PLAN  Annual physical exam - Plan: POCT Urinalysis DIP (Proadvantage Device), Comprehensive metabolic panel, CBC with Differential/Platelet, VITAMIN D 25 Hydroxy (Vit-D Deficiency, Fractures), TSH, Lipid panel  Cyst of thyroid determined by ultrasound - nontender, unchanged. No need for further [place] unless increases in size, causes symptoms; reassured - Plan: TSH  Vitamin D deficiency - Plan: VITAMIN D 25 Hydroxy (Vit-D Deficiency, Fractures)  Need for influenza vaccination - Plan: Flu Vaccine QUAD 6+ mos PF IM (Fluarix Quad PF)   Patient insists on full blood panel being done (including  lipids, were which reviewed in detail from last year, and excellent with more HDL than LDL, >100).     Discussed monthly self breast exams and yearly mammograms; at least 30 minutes of aerobic activity at least 5 days/week, weight-bearing exercise at least 2x/week; proper sunscreen use reviewed; healthy diet, including goals of calcium and vitamin D intake and alcohol recommendations (less than or equal to 1 drink/day) reviewed; regular seatbelt use; changing batteries in smoke detectors, carbon monoxide detectors. Immunization recommendations discussed--flu shot today. Shingrix discussed, recommended. To check insurance and return for [place] when desired. Colonoscopy recommendations reviewed--3 yr f/u due 02/2019  F/u 1 year, sooner prn

## 2017-10-15 ENCOUNTER — Ambulatory Visit: Payer: BLUE CROSS/BLUE SHIELD | Admitting: Family Medicine

## 2017-10-15 ENCOUNTER — Encounter: Payer: Self-pay | Admitting: Family Medicine

## 2017-10-15 VITALS — BP 132/86 | HR 84 | Ht 62.75 in | Wt 124.8 lb

## 2017-10-15 DIAGNOSIS — E559 Vitamin D deficiency, unspecified: Secondary | ICD-10-CM | POA: Diagnosis not present

## 2017-10-15 DIAGNOSIS — E041 Nontoxic single thyroid nodule: Secondary | ICD-10-CM | POA: Diagnosis not present

## 2017-10-15 DIAGNOSIS — Z23 Encounter for immunization: Secondary | ICD-10-CM

## 2017-10-15 DIAGNOSIS — Z0000 Encounter for general adult medical examination without abnormal findings: Secondary | ICD-10-CM

## 2017-10-15 LAB — POCT URINALYSIS DIP (PROADVANTAGE DEVICE)
BILIRUBIN UA: NEGATIVE mg/dL
Bilirubin, UA: NEGATIVE
Glucose, UA: NEGATIVE mg/dL
Nitrite, UA: NEGATIVE
Protein Ur, POC: NEGATIVE mg/dL
RBC UA: NEGATIVE
SPECIFIC GRAVITY, URINE: 1.005
Urobilinogen, Ur: NEGATIVE
pH, UA: 6 (ref 5.0–8.0)

## 2017-10-15 NOTE — Patient Instructions (Addendum)
HEALTH MAINTENANCE RECOMMENDATIONS:  It is recommended that you get at least 30 minutes of aerobic exercise at least 5 days/week (for weight loss, you may need as much as 60-90 minutes). This can be any activity that gets your heart rate up. This can be divided in 10-15 minute intervals if needed, but try and build up your endurance at least once a week.  Weight bearing exercise is also recommended twice weekly.  Eat a healthy diet with lots of vegetables, fruits and fiber.  "Colorful" foods have a lot of vitamins (ie green vegetables, tomatoes, red peppers, etc).  Limit sweet tea, regular sodas and alcoholic beverages, all of which has a lot of calories and sugar.  Up to 1 alcoholic drink daily may be beneficial for women (unless trying to lose weight, watch sugars).  Drink a lot of water.  Calcium recommendations are 1200-1500 mg daily (1500 mg for postmenopausal women or women without ovaries), and vitamin D 1000 IU daily.  This should be obtained from diet and/or supplements (vitamins), and calcium should not be taken all at once, but in divided doses.  Monthly self breast exams and yearly mammograms for women over the age of 33 is recommended.  Sunscreen of at least SPF 30 should be used on all sun-exposed parts of the skin when outside between the hours of 10 am and 4 pm (not just when at beach or pool, but even with exercise, golf, tennis, and yard work!)  Use a sunscreen that says "broad spectrum" so it covers both UVA and UVB rays, and make sure to reapply every 1-2 hours.  Remember to change the batteries in your smoke detectors when changing your clock times in the spring and fall.  Use your seat belt every time you are in a car, and please drive safely and not be distracted with cell phones and texting while driving.  I recommend getting the new shingles vaccine (Shingrix). You will need to check with your insurance to see if it is covered, and schedule a nurse visit if/when desired.   It is a series of 2 injections, spaced 2 months apart.  Periodically check your blood pressure at home.  Goal is <130/80. Up to 135/85 is okay.  If consistently >135-140/85-90, come on back. Limit your salt intake.    Low-Sodium Eating Plan Sodium, which is an element that makes up salt, helps you maintain a healthy balance of fluids in your body. Too much sodium can increase your blood pressure and cause fluid and waste to be held in your body. Your health care provider or dietitian may recommend following this plan if you have high blood pressure (hypertension), kidney disease, liver disease, or heart failure. Eating less sodium can help lower your blood pressure, reduce swelling, and protect your heart, liver, and kidneys. What are tips for following this plan? General guidelines  Most people on this plan should limit their sodium intake to 1,500-2,000 mg (milligrams) of sodium each day. Reading food labels  The Nutrition Facts label lists the amount of sodium in one serving of the food. If you eat more than one serving, you must multiply the listed amount of sodium by the number of servings.  Choose foods with less than 140 mg of sodium per serving.  Avoid foods with 300 mg of sodium or more per serving. Shopping  Look for lower-sodium products, often labeled as "low-sodium" or "no salt added."  Always check the sodium content even if foods are labeled as "unsalted" or "  no salt added".  Buy fresh foods. ? Avoid canned foods and premade or frozen meals. ? Avoid canned, cured, or processed meats  Buy breads that have less than 80 mg of sodium per slice. Cooking  Eat more home-cooked food and less restaurant, buffet, and fast food.  Avoid adding salt when cooking. Use salt-free seasonings or herbs instead of table salt or sea salt. Check with your health care provider or pharmacist before using salt substitutes.  Cook with plant-based oils, such as canola, sunflower, or olive  oil. Meal planning  When eating at a restaurant, ask that your food be prepared with less salt or no salt, if possible.  Avoid foods that contain MSG (monosodium glutamate). MSG is sometimes added to [place] food, bouillon, and some canned foods. What foods are recommended? The items listed may not be a complete list. Talk with your dietitian about what dietary choices are best for you. Grains Low-sodium cereals, including oats, puffed wheat and rice, and shredded wheat. Low-sodium crackers. Unsalted rice. Unsalted pasta. Low-sodium bread. Whole-grain breads and whole-grain pasta. Vegetables Fresh or frozen vegetables. "No salt added" canned vegetables. "No salt added" tomato sauce and paste. Low-sodium or reduced-sodium tomato and vegetable juice. Fruits Fresh, frozen, or canned fruit. Fruit juice. Meats and other protein foods Fresh or frozen (no salt added) meat, poultry, seafood, and fish. Low-sodium canned tuna and salmon. Unsalted nuts. Dried peas, beans, and lentils without added salt. Unsalted canned beans. Eggs. Unsalted nut butters. Dairy Milk. Soy milk. Cheese that is naturally low in sodium, such as ricotta cheese, fresh mozzarella, or Swiss cheese Low-sodium or reduced-sodium cheese. Cream cheese. Yogurt. Fats and oils Unsalted butter. Unsalted margarine with no trans fat. Vegetable oils such as canola or olive oils. Seasonings and other foods Fresh and dried herbs and spices. Salt-free seasonings. Low-sodium mustard and ketchup. Sodium-free salad dressing. Sodium-free light mayonnaise. Fresh or refrigerated horseradish. Lemon juice. Vinegar. Homemade, reduced-sodium, or low-sodium soups. Unsalted popcorn and pretzels. Low-salt or salt-free chips. What foods are not recommended? The items listed may not be a complete list. Talk with your dietitian about what dietary choices are best for you. Grains Instant hot cereals. Bread stuffing, pancake, and biscuit mixes. Croutons.  Seasoned rice or pasta mixes. Noodle soup cups. Boxed or frozen macaroni and cheese. Regular salted crackers. Self-rising flour. Vegetables Sauerkraut, pickled vegetables, and relishes. Olives. [place] fries. Onion rings. Regular canned vegetables (not low-sodium or reduced-sodium). Regular canned tomato sauce and paste (not low-sodium or reduced-sodium). Regular tomato and vegetable juice (not low-sodium or reduced-sodium). Frozen vegetables in sauces. Meats and other protein foods Meat or fish that is salted, canned, smoked, spiced, or pickled. Bacon, ham, sausage, hotdogs, corned beef, chipped beef, packaged lunch meats, salt pork, jerky, pickled herring, anchovies, regular canned tuna, sardines, salted nuts. Dairy Processed cheese and cheese spreads. Cheese curds. Blue cheese. Feta cheese. String cheese. Regular cottage cheese. Buttermilk. Canned milk. Fats and oils Salted butter. Regular margarine. Ghee. Bacon fat. Seasonings and other foods Onion salt, garlic salt, seasoned salt, table salt, and sea salt. Canned and packaged gravies. Worcestershire sauce. Tartar sauce. Barbecue sauce. Teriyaki sauce. Soy sauce, including reduced-sodium. Steak sauce. Fish sauce. Oyster sauce. Cocktail sauce. Horseradish that you find on the shelf. Regular ketchup and mustard. Meat flavorings and tenderizers. Bouillon cubes. Hot sauce and Tabasco sauce. Premade or packaged marinades. Premade or packaged taco seasonings. Relishes. Regular salad dressings. Salsa. Potato and tortilla chips. Corn chips and puffs. Salted popcorn and pretzels. Canned or dried soups.  Pizza. Frozen entrees and pot pies. Summary  Eating less sodium can help lower your blood pressure, reduce swelling, and protect your heart, liver, and kidneys.  Most people on this plan should limit their sodium intake to 1,500-2,000 mg (milligrams) of sodium each day.  Canned, boxed, and frozen foods are high in sodium. Restaurant foods, fast foods, and  pizza are also very high in sodium. You also get sodium by adding salt to food.  Try to cook at home, eat more fresh fruits and vegetables, and eat less fast food, canned, processed, or prepared foods. This information is not intended to replace advice given to you by your health care provider. Make sure you discuss any questions you have with your health care provider. Document Released: 07/20/2001 Document Revised: 01/22/2016 Document Reviewed: 01/22/2016 Elsevier Interactive Patient Education  [employer].

## 2017-10-16 LAB — COMPREHENSIVE METABOLIC PANEL
A/G RATIO: 2 (ref 1.2–2.2)
ALBUMIN: 4.5 g/dL (ref 3.5–5.5)
ALK PHOS: 55 IU/L (ref 39–117)
ALT: 17 IU/L (ref 0–32)
AST: 40 IU/L (ref 0–40)
BILIRUBIN TOTAL: 0.5 mg/dL (ref 0.0–1.2)
BUN / CREAT RATIO: 11 (ref 9–23)
BUN: 6 mg/dL (ref 6–24)
CHLORIDE: 94 mmol/L — AB (ref 96–106)
CO2: 24 mmol/L (ref 20–29)
Calcium: 9.4 mg/dL (ref 8.7–10.2)
Creatinine, Ser: 0.53 mg/dL — ABNORMAL LOW (ref 0.57–1.00)
GFR calc non Af Amer: 109 mL/min/{1.73_m2} (ref >59)
GFR, EST AFRICAN AMERICAN: 125 mL/min/{1.73_m2} (ref >59)
Globulin, Total: 2.3 g/dL (ref 1.5–4.5)
Glucose: 87 mg/dL (ref 65–99)
POTASSIUM: 4.3 mmol/L (ref 3.5–5.2)
SODIUM: 134 mmol/L (ref 134–144)
Total Protein: 6.8 g/dL (ref 6.0–8.5)

## 2017-10-16 LAB — LIPID PANEL
Chol/HDL Ratio: 2.3 ratio (ref 0.0–4.4)
Cholesterol, Total: 237 mg/dL — ABNORMAL HIGH (ref 100–199)
HDL: 104 mg/dL (ref >39)
LDL Calculated: 118 mg/dL — ABNORMAL HIGH (ref 0–99)
Triglycerides: 73 mg/dL (ref 0–149)
VLDL Cholesterol Cal: 15 mg/dL (ref 5–40)

## 2017-10-16 LAB — VITAMIN D 25 HYDROXY (VIT D DEFICIENCY, FRACTURES): Vit D, 25-Hydroxy: 37.3 ng/mL (ref 30.0–100.0)

## 2017-10-16 LAB — CBC WITH DIFFERENTIAL/PLATELET
BASOS ABS: 0 10*3/uL (ref 0.0–0.2)
Basos: 1 %
EOS (ABSOLUTE): 0 10*3/uL (ref 0.0–0.4)
Eos: 1 %
HEMOGLOBIN: 12.4 g/dL (ref 11.1–15.9)
Hematocrit: 36.4 % (ref 34.0–46.6)
Immature Grans (Abs): 0 10*3/uL (ref 0.0–0.1)
Immature Granulocytes: 0 %
LYMPHS ABS: 1.2 10*3/uL (ref 0.7–3.1)
Lymphs: 32 %
MCH: 33.2 pg — AB (ref 26.6–33.0)
MCHC: 34.1 g/dL (ref 31.5–35.7)
MCV: 97 fL (ref 79–97)
MONOS ABS: 0.5 10*3/uL (ref 0.1–0.9)
Monocytes: 14 %
NEUTROS ABS: 2 10*3/uL (ref 1.4–7.0)
Neutrophils: 52 %
Platelets: 249 10*3/uL (ref 150–450)
RBC: 3.74 x10E6/uL — AB (ref 3.77–5.28)
RDW: 12.4 % (ref 12.3–15.4)
WBC: 3.8 10*3/uL (ref 3.4–10.8)

## 2017-10-16 LAB — TSH: TSH: 1.74 u[IU]/mL (ref 0.450–4.500)

## 2017-11-20 ENCOUNTER — Encounter: Payer: Self-pay | Admitting: Family Medicine

## 2017-11-20 ENCOUNTER — Telehealth: Payer: Self-pay | Admitting: *Deleted

## 2017-11-20 NOTE — Telephone Encounter (Signed)
Left message on my vm yesterday stating that her bp values are the same as her Sept visit and she wanted to discuss these as she is not happy with these. Do you want her to schedule a visit?

## 2017-11-20 NOTE — Telephone Encounter (Signed)
Message sent to patient asking for her to email BP values.

## 2017-11-21 ENCOUNTER — Telehealth: Payer: Self-pay | Admitting: Family Medicine

## 2017-11-21 NOTE — Telephone Encounter (Signed)
Pt came in and dropped off bp readings. Sending back for review. Pt can be reached at [phone].

## 2017-11-24 ENCOUNTER — Encounter: Payer: Self-pay | Admitting: Family Medicine

## 2017-11-24 NOTE — Telephone Encounter (Signed)
BP's reviewed.  [phone] Average 120's/80 (124/80 when calculated). Messaged patient.

## 2017-11-27 ENCOUNTER — Other Ambulatory Visit: Payer: Self-pay | Admitting: Women's Health

## 2017-11-27 DIAGNOSIS — Z1231 Encounter for screening mammogram for malignant neoplasm of breast: Secondary | ICD-10-CM

## 2018-03-16 ENCOUNTER — Encounter: Payer: BLUE CROSS/BLUE SHIELD | Admitting: Women's Health

## 2018-04-01 ENCOUNTER — Other Ambulatory Visit: Payer: Self-pay | Admitting: Family Medicine

## 2018-04-01 ENCOUNTER — Ambulatory Visit
Admission: RE | Admit: 2018-04-01 | Discharge: 2018-04-01 | Disposition: A | Discharge status: Home / Self Care | Payer: BLUE CROSS/BLUE SHIELD | Source: Ambulatory Visit | Attending: Women's Health | Admitting: Women's Health

## 2018-04-01 DIAGNOSIS — Z1231 Encounter for screening mammogram for malignant neoplasm of breast: Secondary | ICD-10-CM

## 2018-08-03 DIAGNOSIS — H5213 Myopia, bilateral: Secondary | ICD-10-CM | POA: Diagnosis not present

## 2018-08-03 DIAGNOSIS — H10413 Chronic giant papillary conjunctivitis, bilateral: Secondary | ICD-10-CM | POA: Diagnosis not present

## 2018-09-14 DIAGNOSIS — H04123 Dry eye syndrome of bilateral lacrimal glands: Secondary | ICD-10-CM | POA: Diagnosis not present

## 2018-10-25 NOTE — Patient Instructions (Addendum)
HEALTH MAINTENANCE RECOMMENDATIONS:  It is recommended that you get at least 30 minutes of aerobic exercise at least 5 days/week (for weight loss, you may need as much as 60-90 minutes). This can be any activity that gets your heart rate up. This can be divided in 10-15 minute intervals if needed, but try and build up your endurance at least once a week.  Weight bearing exercise is also recommended twice weekly.  Eat a healthy diet with lots of vegetables, fruits and fiber.  "Colorful" foods have a lot of vitamins (ie green vegetables, tomatoes, red peppers, etc).  Limit sweet tea, regular sodas and alcoholic beverages, all of which has a lot of calories and sugar.  Up to 1 alcoholic drink daily may be beneficial for women (unless trying to lose weight, watch sugars).  Drink a lot of water.  Calcium recommendations are 1200-1500 mg daily (1500 mg for postmenopausal women or women without ovaries), and vitamin D 1000 IU daily.  This should be obtained from diet and/or supplements (vitamins), and calcium should not be taken all at once, but in divided doses.  Monthly self breast exams and yearly mammograms for women over the age of 64 is recommended.  Sunscreen of at least SPF 30 should be used on all sun-exposed parts of the skin when outside between the hours of 10 am and 4 pm (not just when at beach or pool, but even with exercise, golf, tennis, and yard work!)  Use a sunscreen that says "broad spectrum" so it covers both UVA and UVB rays, and make sure to reapply every 1-2 hours.  Remember to change the batteries in your smoke detectors when changing your clock times in the spring and fall. Carbon monoxide detectors are recommended for your home.  Use your seat belt every time you are in a car, and please drive safely and not be distracted with cell phones and texting while driving.  Please call to schedule yearly GYN visit.  Your next colonoscopy is due 02/2019 (they should be contacting you  directly to schedule, when it is closer).  I recommend getting the new shingles vaccine (Shingrix). You will need to check with your insurance to see if it is covered, and if covered, schedule a nurse visit when convenient.  It is a series of 2 injections, spaced 2 months apart.   Monitor your blood pressure at home. Cut back on the sodium. Set a nurse visit to verify the accuracy of the monitor, and send [place] a list of your blood pressures in the next 4 weeks. Goal blood pressure is under 130/80.  If it is consistently 135-140/85-90 or higher, than treatment is indicated.    Low-Sodium Eating Plan Sodium, which is an element that makes up salt, helps you maintain a healthy balance of fluids in your body. Too much sodium can increase your blood pressure and cause fluid and waste to be held in your body. Your health care provider or dietitian may recommend following this plan if you have high blood pressure (hypertension), kidney disease, liver disease, or heart failure. Eating less sodium can help lower your blood pressure, reduce swelling, and protect your heart, liver, and kidneys. What are tips for following this plan? General guidelines  Most people on this plan should limit their sodium intake to 1,500-2,000 mg (milligrams) of sodium each day. Reading food labels   The Nutrition Facts label lists the amount of sodium in one serving of the food. If you eat more than one  serving, you must multiply the listed amount of sodium by the number of servings.  Choose foods with less than 140 mg of sodium per serving.  Avoid foods with 300 mg of sodium or more per serving. Shopping  Look for lower-sodium products, often labeled as "low-sodium" or "no salt added."  Always check the sodium content even if foods are labeled as "unsalted" or "no salt added".  Buy fresh foods. ? Avoid canned foods and premade or frozen meals. ? Avoid canned, cured, or processed meats  Buy breads that have less  than 80 mg of sodium per slice. Cooking  Eat more home-cooked food and less restaurant, buffet, and fast food.  Avoid adding salt when cooking. Use salt-free seasonings or herbs instead of table salt or sea salt. Check with your health care provider or pharmacist before using salt substitutes.  Cook with plant-based oils, such as canola, sunflower, or olive oil. Meal planning  When eating at a restaurant, ask that your food be prepared with less salt or no salt, if possible.  Avoid foods that contain MSG (monosodium glutamate). MSG is sometimes added to [place] food, bouillon, and some canned foods. What foods are recommended? The items listed may not be a complete list. Talk with your dietitian about what dietary choices are best for you. Grains Low-sodium cereals, including oats, puffed wheat and rice, and shredded wheat. Low-sodium crackers. Unsalted rice. Unsalted pasta. Low-sodium bread. Whole-grain breads and whole-grain pasta. Vegetables Fresh or frozen vegetables. "No salt added" canned vegetables. "No salt added" tomato sauce and paste. Low-sodium or reduced-sodium tomato and vegetable juice. Fruits Fresh, frozen, or canned fruit. Fruit juice. Meats and other protein foods Fresh or frozen (no salt added) meat, poultry, seafood, and fish. Low-sodium canned tuna and salmon. Unsalted nuts. Dried peas, beans, and lentils without added salt. Unsalted canned beans. Eggs. Unsalted nut butters. Dairy Milk. Soy milk. Cheese that is naturally low in sodium, such as ricotta cheese, fresh mozzarella, or Swiss cheese Low-sodium or reduced-sodium cheese. Cream cheese. Yogurt. Fats and oils Unsalted butter. Unsalted margarine with no trans fat. Vegetable oils such as canola or olive oils. Seasonings and other foods Fresh and dried herbs and spices. Salt-free seasonings. Low-sodium mustard and ketchup. Sodium-free salad dressing. Sodium-free light mayonnaise. Fresh or refrigerated horseradish.  Lemon juice. Vinegar. Homemade, reduced-sodium, or low-sodium soups. Unsalted popcorn and pretzels. Low-salt or salt-free chips. What foods are not recommended? The items listed may not be a complete list. Talk with your dietitian about what dietary choices are best for you. Grains Instant hot cereals. Bread stuffing, pancake, and biscuit mixes. Croutons. Seasoned rice or pasta mixes. Noodle soup cups. Boxed or frozen macaroni and cheese. Regular salted crackers. Self-rising flour. Vegetables Sauerkraut, pickled vegetables, and relishes. Olives. [place] fries. Onion rings. Regular canned vegetables (not low-sodium or reduced-sodium). Regular canned tomato sauce and paste (not low-sodium or reduced-sodium). Regular tomato and vegetable juice (not low-sodium or reduced-sodium). Frozen vegetables in sauces. Meats and other protein foods Meat or fish that is salted, canned, smoked, spiced, or pickled. Bacon, ham, sausage, hotdogs, corned beef, chipped beef, packaged lunch meats, salt pork, jerky, pickled herring, anchovies, regular canned tuna, sardines, salted nuts. Dairy Processed cheese and cheese spreads. Cheese curds. Blue cheese. Feta cheese. String cheese. Regular cottage cheese. Buttermilk. Canned milk. Fats and oils Salted butter. Regular margarine. Ghee. Bacon fat. Seasonings and other foods Onion salt, garlic salt, seasoned salt, table salt, and sea salt. Canned and packaged gravies. Worcestershire sauce. Tartar sauce. Barbecue sauce.  Teriyaki sauce. Soy sauce, including reduced-sodium. Steak sauce. Fish sauce. Oyster sauce. Cocktail sauce. Horseradish that you find on the shelf. Regular ketchup and mustard. Meat flavorings and tenderizers. Bouillon cubes. Hot sauce and Tabasco sauce. Premade or packaged marinades. Premade or packaged taco seasonings. Relishes. Regular salad dressings. Salsa. Potato and tortilla chips. Corn chips and puffs. Salted popcorn and pretzels. Canned or dried soups.  Pizza. Frozen entrees and pot pies. Summary  Eating less sodium can help lower your blood pressure, reduce swelling, and protect your heart, liver, and kidneys.  Most people on this plan should limit their sodium intake to 1,500-2,000 mg (milligrams) of sodium each day.  Canned, boxed, and frozen foods are high in sodium. Restaurant foods, fast foods, and pizza are also very high in sodium. You also get sodium by adding salt to food.  Try to cook at home, eat more fresh fruits and vegetables, and eat less fast food, canned, processed, or prepared foods. This information is not intended to replace advice given to you by your health care provider. Make sure you discuss any questions you have with your health care provider. Document Released: 07/20/2001 Document Revised: 01/10/2017 Document Reviewed: 01/22/2016 Elsevier Patient Education  2020 [employer].

## 2018-10-25 NOTE — Progress Notes (Signed)
Chief Complaint  Patient presents with  . Annual Exam    fasting annual exam no pap. No concerns.    Deborah Meadows is a 53 y.o. female who presents for a complete physical.  She has the following concerns:  She had borderline/elevated BP in office in the past.  She had cut back on sodium in her diet, and monitored regularly, and BP's were fine (see messages from 11/2017).  She hasn't been checking BP recently.  Has been using more Will sea salt and potato chips. She denies headaches, chest pain. Has a wrist monitor, hasn't been verified yet as accurate.  Postmenopausal.  She hasn't had cycles since prior to her ablation. Had issues with moods, irritability a couple of years ago, now better.   Hot flashes are at night, short-lived, only mild/tolerable sweats.  She had previously complained of bloating, which improved after cutting out eggs and milk. She is back to eating eggs, doesn't drink milk. Sometimes feels bloated, infrequent.  Has some cheese, no yogurt.  February 2018 she presented with a neck mass, found to be a benign cyst per ultrasound. She denies any changes, pain.  IMPRESSION: 1. The palpable abnormality corresponds with a 1 cm benign cyst arising exophytic from the left aspect the thyroid isthmus. While this lesion requires no dedicated imaging follow-up, or biopsy, aspiration could be performed if clinically warranted for symptomatic or cosmetic relief. 2. Additional scattered bilateral tiny thyroid nodules which do not meet criteria for either imaging follow-up or biopsy.  It does not look or feel bigger to her. Currently denies any difficulty swallowing or symptoms related to the lump.   Vitamin D deficiency was noted in 2017, with a level of 26. Level was 37.3 last year, when she been taking OTC Vitamin D3 2000 IU a few times/week. She is currently taking 5000 IU, 3-4 times/week.  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  She had seen Dr.  Derrel Nip in the past for a rash, was prescribed a steroid cream for the rash. Uses the steroid cream occasionally for her eczema on her ears, works well. Rosacea: Just redness on the cheeks, no breakouts. Worse in the summer. Has never been treated. Denies any current skin concerns.  Immunization History  Administered Date(s) Administered  . Influenza Split 11/01/2010  . Influenza,inj,Quad PF,6+ Mos 11/02/2012, 10/05/2015, 10/09/2016, 10/15/2017  . Tdap 11/02/2012   Last Pap smear:02/2017 ASCUS, no high risk HPV (also done 02/2016, normal and also no HR HPV) Last mammogram:03/2018 Last colonoscopy:02/2016, Dr. Silverio Decamp; 8mm tubular adenoma; repeat recommended 02/2019  Last DEXA: never Dentist:  twice yearly Ophtho: yearly Exercise: Walks an hour every day (if too busy, then just 20 minutes at night). Does [employer] weight workouts from home. Lipid screen: Lab Results  Component Value Date   CHOL 237 (H) 10/15/2017   HDL 104 10/15/2017   LDLCALC 118 (H) 10/15/2017   TRIG 73 10/15/2017   CHOLHDL 2.3 10/15/2017   Past Medical History:  Diagnosis Date  . Anemia    borderline  . Blood dyscrasia    had test 18 years ago. No diagnosis  . Irregular heart beat    normal eval by Dr. Radford Pax in past    Past Surgical History:  Procedure Laterality Date  . CESAREAN SECTION  1999  . DILATATION & CURETTAGE/HYSTEROSCOPY WITH MYOSURE N/A 07/04/2015   Procedure: DILATATION & CURETTAGE/HYSTEROSCOPY WITH MYOSURE;  Surgeon: Terrance Mass, MD;  Location: [facility];  Service: Gynecology;  Laterality:  N/A;  . DILATION AND CURETTAGE OF UTERUS     x three  . HYSTEROSCOPY WITH NOVASURE N/A 07/04/2015   Procedure: Novasure ;  Surgeon: Terrance Mass, MD;  Location: [facility];  Service: Gynecology;  Laterality: N/A;  . WISDOM TOOTH EXTRACTION     x2    Social History   Socioeconomic History  . Marital status: Married    Spouse name: Not on file  . Number of children: Not on file  .  Years of education: Not on file  . Highest education level: Not on file  Occupational History  . Occupation: works at [employer]  . Financial resource strain: Not on file  . Food insecurity    Worry: Not on file    Inability: Not on file  . Transportation needs    Medical: Not on file    Non-medical: Not on file  Tobacco Use  . Smoking status: Never Smoker  . Smokeless tobacco: Never Used  Substance and Sexual Activity  . Alcohol use: Yes    Alcohol/week: 1.0 standard drinks    Types: 1 Glasses of wine per week    Comment: 1 glass  (6-8 ounces) mostly on weekends, sometimes during the week  . Drug use: No  . Sexual activity: Not Currently    Birth control/protection: Other-see comments    Comment: vasectomy,des neg  Lifestyle  . Physical activity    Days per week: Not on file    Minutes per session: Not on file  . Stress: Not on file  Relationships  . Social Herbalist on phone: Not on file    Gets together: Not on file    Attends religious service: Not on file    Active member of club or organization: Not on file    Attends meetings of clubs or organizations: Not on file    Relationship status: Not on file  . Intimate partner violence    Fear of current or ex partner: Not on file    Emotionally abused: Not on file    Physically abused: Not on file    Forced sexual activity: Not on file  Other Topics Concern  . Not on file  Social History Narrative   Married, 3 children, 3 dogs.  Oldest is at [employer]. Middle at [place]. Son is at [employer]. 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    Family History  Problem Relation Age of Onset  . Hypertension Mother   . Hyperlipidemia Father   . Cancer Sister        thyroid  . Hypertension Sister   . Heart disease Maternal Grandmother   . Alzheimer's disease Paternal Grandmother   . Diabetes Neg Hx   . Colon cancer Neg Hx     Outpatient Encounter  Medications as of 10/28/2018  Medication Sig Note  . VITAMIN D PO Take 5,000 Int'l Units by mouth daily. 10/28/2018: Takes 3-4 times/week  . triamcinolone cream (KENALOG) 0.1 % APPLY TO ACTIVE RASH TWICE A DAY AS NEEDED   . [DISCONTINUED] Cholecalciferol (D3-1000) 1000 units capsule Take 1,000 Units by mouth daily. 10/15/2017: Takes sporadically, about 2-3x/week; took 2 yesterday   No facility-administered encounter medications on file as of 10/28/2018.     Allergies  Allergen Reactions  . Penicillins Hives and Itching    Has patient had a PCN reaction causing immediate rash, facial/tongue/throat swelling, SOB or lightheadedness with  hypotension: Yes Has patient had a PCN reaction causing severe rash involving mucus membranes or skin necrosis: No Has patient had a PCN reaction that required hospitalization No Has patient had a PCN reaction occurring within the last 10 years: No If all of the above answers are "NO", then may proceed with Cephalosporin use.     ROS: The patient denies anorexia, fever, weight changes, headaches, vision changes, decreased hearing, ear pain, sore throat, breast concerns, chest pain, palpitations, dizziness, syncope, dyspnea on exertion, cough, swelling, nausea, vomiting, diarrhea, constipation, abdominal pain, melena, hematochezia, indigestion/heartburn, hematuria, incontinence, dysuria, vaginal bleeding, discharge, odor or itch, genital lesions, joint pains, numbness, tingling, weakness, tremor, suspicious skin lesions, depression, anxiety, abnormal bleeding/bruising, or enlarged lymph nodes. Stress incontinence with sneezing. S/p endometrial ablation, no bleeding. Only mild hot flashes. No sweats. Some chronic postnasal drainage.   PHYSICAL EXAM:  BP (!) 152/100   Pulse 80   Temp (!) 96.7 F (35.9 C) (Other (Comment))   Ht 5\' 3"  (1.6 m)   Wt 122 lb 6.4 oz (55.5 kg)   BMI 21.68 kg/m   160/92 on repeat by MD, RA  Wt Readings from Last 3 Encounters:   10/28/18 122 lb 6.4 oz (55.5 kg)  10/15/17 124 lb 12.8 oz (56.6 kg)  01/06/17 119 lb 9.6 oz (54.3 kg)    General Appearance:  Alert, cooperative, no distress, appears stated age. Somewhat excitable.  Head:  Normocephalic, without obvious abnormality, atraumatic  Eyes:  PERRL, conjunctiva/corneas clear, EOM's intact, fundi benign  Ears:  Normal TM's and external ear canals.  Nose: Not examined, wearing mask due to COVID-19 pandemic  Throat: Not examined, wearing mask due to COVID-19 pandemic  Neck: Supple, no lymphadenopathy; thyroid: nontender. There is a 1-1.5cm soft, superficial, mobile mass just to the left of midline over thyroid, unchanged from prior visit. No carotidbruit or JVD  Back:  Spine nontender, no curvature, ROM normal, no CVAtenderness  Lungs:  Clear to auscultation bilaterally without wheezes, rales or ronchi;  respirations unlabored  Chest Wall:  No tenderness or deformity  Heart:  Regular rate and rhythm, S1 and S2 normal, no murmur, rub or gallop  Breast Exam:  Deferred to GYN  Abdomen:  Soft, non-tender, nondistended, normoactive bowel sounds, no masses, no hepatosplenomegaly  Genitalia:  Deferred to GYN     Extremities: No clubbing, cyanosis or edema  Pulses: 2+ and symmetric all extremities  Skin: Skin color, texture, turgor normal. (wearing mask, redness of cheeks not evaluated today)  Lymph nodes: Cervical, supraclavicular, and axillary nodes normal  Neurologic: Normal strength, sensation and gait; reflexes 2+ and symmetric throughout  Psych: Normal mood, affect, hygiene and grooming    ASSESSMENT/PLAN:  Annual physical exam - Plan: Lipid panel, Comprehensive metabolic panel, CBC with Differential/Platelet, VITAMIN D 25 Hydroxy (Vit-D Deficiency, Fractures), TSH  Cyst of thyroid determined by ultrasound - stable, unchanged - Plan: TSH  Vitamin D deficiency - continue supplements as discussed  (7000-15000/week is adequate) - Plan: VITAMIN D 25 Hydroxy (Vit-D Deficiency, Fractures)  Need for influenza vaccination - Plan: Flu Vaccine QUAD 6+ mos PF IM (Fluarix Quad PF)  Blood pressure elevated without history of HTN - reviewed low Na diet, to monitor at home. Discussed hereditary component. NV to verify accuracy of monitor, then send in values within month - Plan: Comprehensive metabolic panel  Wants all labs done, as before. Desires everything done yearly, insurance covers.  Past due to see GYN (message from GYN in 03/2018 recommended she schedule appt;  last pap was ASCUS). She was reminded to schedule.  Discussed monthly self breast exams and yearly mammograms; at least 30 minutes of aerobic activity at least 5 days/week, weight-bearing exercise at least 2x/week; proper sunscreen use reviewed; healthy diet, including goals of calcium and vitamin D intake and alcohol recommendations (less than or equal to 1 drink/day) reviewed; regular seatbelt use; changing batteries in smoke detectors, carbon monoxide detectors. Immunization recommendations discussed--flu shot today. Shingrix discussed, recommended. To check insurance and return for NV when desired.Colonoscopy recommendations reviewed--3 yr f/u due 02/2019  F/u 1 year, sooner prn  Monitor your blood pressure at home. Cut back on the sodium. Set a nurse visit to verify the accuracy of the monitor, and send [place] a list of your blood pressures in the next 4 weeks. Goal blood pressure is under 130/80.  If it is consistently 135-140/85-90 or higher, than treatment is indicated.

## 2018-10-28 ENCOUNTER — Encounter: Payer: Self-pay | Admitting: Family Medicine

## 2018-10-28 ENCOUNTER — Ambulatory Visit: Payer: BC Managed Care – PPO | Admitting: Family Medicine

## 2018-10-28 ENCOUNTER — Other Ambulatory Visit: Payer: Self-pay

## 2018-10-28 VITALS — BP 152/100 | HR 80 | Temp 96.7°F | Ht 63.0 in | Wt 122.4 lb

## 2018-10-28 DIAGNOSIS — E559 Vitamin D deficiency, unspecified: Secondary | ICD-10-CM | POA: Diagnosis not present

## 2018-10-28 DIAGNOSIS — Z23 Encounter for immunization: Secondary | ICD-10-CM | POA: Diagnosis not present

## 2018-10-28 DIAGNOSIS — E041 Nontoxic single thyroid nodule: Secondary | ICD-10-CM

## 2018-10-28 DIAGNOSIS — Z0000 Encounter for general adult medical examination without abnormal findings: Secondary | ICD-10-CM

## 2018-10-28 DIAGNOSIS — R030 Elevated blood-pressure reading, without diagnosis of hypertension: Secondary | ICD-10-CM | POA: Diagnosis not present

## 2018-10-29 LAB — COMPREHENSIVE METABOLIC PANEL
ALT: 14 IU/L (ref 0–32)
AST: 33 IU/L (ref 0–40)
Albumin/Globulin Ratio: 2 (ref 1.2–2.2)
Albumin: 4.7 g/dL (ref 3.8–4.9)
Alkaline Phosphatase: 59 IU/L (ref 39–117)
BUN/Creatinine Ratio: 13 (ref 9–23)
BUN: 9 mg/dL (ref 6–24)
Bilirubin Total: 0.7 mg/dL (ref 0.0–1.2)
CO2: 24 mmol/L (ref 20–29)
Calcium: 9.5 mg/dL (ref 8.7–10.2)
Chloride: 96 mmol/L (ref 96–106)
Creatinine, Ser: 0.7 mg/dL (ref 0.57–1.00)
GFR calc Af Amer: 114 mL/min/{1.73_m2} (ref >59)
GFR calc non Af Amer: 99 mL/min/{1.73_m2} (ref >59)
Globulin, Total: 2.3 g/dL (ref 1.5–4.5)
Glucose: 91 mg/dL (ref 65–99)
Potassium: 4.5 mmol/L (ref 3.5–5.2)
Sodium: 137 mmol/L (ref 134–144)
Total Protein: 7 g/dL (ref 6.0–8.5)

## 2018-10-29 LAB — CBC WITH DIFFERENTIAL/PLATELET
Basophils Absolute: 0.1 10*3/uL (ref 0.0–0.2)
Basos: 1 %
EOS (ABSOLUTE): 0 10*3/uL (ref 0.0–0.4)
Eos: 0 %
Hematocrit: 39.2 % (ref 34.0–46.6)
Hemoglobin: 13.6 g/dL (ref 11.1–15.9)
Immature Grans (Abs): 0 10*3/uL (ref 0.0–0.1)
Immature Granulocytes: 0 %
Lymphocytes Absolute: 1.1 10*3/uL (ref 0.7–3.1)
Lymphs: 23 %
MCH: 33.1 pg — ABNORMAL HIGH (ref 26.6–33.0)
MCHC: 34.7 g/dL (ref 31.5–35.7)
MCV: 95 fL (ref 79–97)
Monocytes Absolute: 0.5 10*3/uL (ref 0.1–0.9)
Monocytes: 10 %
Neutrophils Absolute: 3 10*3/uL (ref 1.4–7.0)
Neutrophils: 66 %
Platelets: 252 10*3/uL (ref 150–450)
RBC: 4.11 x10E6/uL (ref 3.77–5.28)
RDW: 12.1 % (ref 11.7–15.4)
WBC: 4.7 10*3/uL (ref 3.4–10.8)

## 2018-10-29 LAB — LIPID PANEL
Chol/HDL Ratio: 2.2 ratio (ref 0.0–4.4)
Cholesterol, Total: 262 mg/dL — ABNORMAL HIGH (ref 100–199)
HDL: 118 mg/dL (ref >39)
LDL Chol Calc (NIH): 137 mg/dL — ABNORMAL HIGH (ref 0–99)
Triglycerides: 45 mg/dL (ref 0–149)
VLDL Cholesterol Cal: 7 mg/dL (ref 5–40)

## 2018-10-29 LAB — VITAMIN D 25 HYDROXY (VIT D DEFICIENCY, FRACTURES): Vit D, 25-Hydroxy: 52.7 ng/mL (ref 30.0–100.0)

## 2018-10-29 LAB — TSH: TSH: 2.41 u[IU]/mL (ref 0.450–4.500)

## 2018-11-04 ENCOUNTER — Other Ambulatory Visit: Payer: BC Managed Care – PPO

## 2018-11-04 ENCOUNTER — Encounter: Payer: Self-pay | Admitting: Gynecology

## 2019-01-04 ENCOUNTER — Other Ambulatory Visit: Payer: Self-pay

## 2019-01-05 ENCOUNTER — Ambulatory Visit: Payer: BC Managed Care – PPO | Admitting: Women's Health

## 2019-01-05 ENCOUNTER — Encounter: Payer: Self-pay | Admitting: Women's Health

## 2019-01-05 VITALS — BP 120/80 | Ht 63.0 in | Wt 121.8 lb

## 2019-01-05 DIAGNOSIS — Z13820 Encounter for screening for osteoporosis: Secondary | ICD-10-CM | POA: Diagnosis not present

## 2019-01-05 DIAGNOSIS — R87610 Atypical squamous cells of undetermined significance on cytologic smear of cervix (ASC-US): Secondary | ICD-10-CM | POA: Diagnosis not present

## 2019-01-05 DIAGNOSIS — Z01419 Encounter for gynecological examination (general) (routine) without abnormal findings: Secondary | ICD-10-CM

## 2019-01-05 NOTE — Progress Notes (Signed)
Deborah Meadows Jan 29, 1966 [id number]    History:    Presents for annual exam.  Postmenopausal on no HRT with no bleeding.  Not sexually active.  Normal Pap and mammogram history.  2018 benign colon adenoma 3-year follow-up.  Labs primary care  Past medical history, past surgical history, family history and social history were all reviewed and documented in the EPIC chart.  Works at [employer].  Daughters are 54, 17 1 is at [employer] other at [facility] both doing well.  Son 15.  All have had Gardasil.  Sister thyroid cancer.  ROS:  A ROS was performed and pertinent positives and negatives are included.  Exam:  Vitals:   01/05/19 1217  BP: 120/80  Weight: 121 lb 12.8 oz (55.2 kg)  Height: 5\' 3"  (1.6 m)   Body mass index is 21.58 kg/m.   General appearance:  Normal Thyroid:  Symmetrical, normal in size, without palpable masses or nodularity. Respiratory  Auscultation:  Clear without wheezing or rhonchi Cardiovascular  Auscultation:  Regular rate, without rubs, murmurs or gallops  Edema/varicosities:  Not grossly evident Abdominal  Soft,nontender, without masses, guarding or rebound.  Liver/spleen:  No organomegaly noted  Hernia:  None appreciated  Skin  Inspection:  Grossly normal   Breasts: Examined lying and sitting.     Right: Without masses, retractions, discharge or axillary adenopathy.     Left: Without masses, retractions, discharge or axillary adenopathy. Gentitourinary   Inguinal/mons:  Normal without inguinal adenopathy  External genitalia:  Normal  BUS/Urethra/Skene's glands:  Normal  Vagina:  Normal  Cervix:  Normal  Uterus:  normal in size, shape and contour.  Midline and mobile  Adnexa/parametria:     Rt: Without masses or tenderness.   Lt: Without masses or tenderness.  Anus and perineum: Normal  Digital rectal exam: Normal sphincter tone without palpated masses or tenderness  Assessment/Plan:  53 y.o. MWF G6, P3 for annual exam with no  complaints.  Postmenopausal/no HRT/no bleeding Labs-primary care  Plan: SBEs, continue annual screening mammogram, calcium rich foods, vitamin D 2000 daily encouraged.  Continue active lifestyle with regular exercise.  DEXA.  Pap per request, Pap normal with negative HR HPV 2018.  [place], 12:43 PM 01/05/2019

## 2019-01-05 NOTE — Patient Instructions (Signed)
Good to see you today! Vit D 200 iu  Health Maintenance for Postmenopausal Women Menopause is a normal process in which your ability to get pregnant comes to an end. This process happens slowly over many months or years, usually between the ages of 41 and 35. Menopause is complete when you have missed your menstrual periods for 12 months. It is important to talk with your health care provider about some of the most common conditions that affect women after menopause (postmenopausal women). These include heart disease, cancer, and bone loss (osteoporosis). Adopting a healthy lifestyle and getting preventive care can help to promote your health and wellness. The actions you take can also lower your chances of developing some of these common conditions. What should I know about menopause? During menopause, you may get a number of symptoms, such as:  Hot flashes. These can be moderate or severe.  Night sweats.  Decrease in sex drive.  Mood swings.  Headaches.  Tiredness.  Irritability.  Memory problems.  Insomnia. Choosing to treat or not to treat these symptoms is a decision that you make with your health care provider. Do I need hormone replacement therapy?  Hormone replacement therapy is effective in treating symptoms that are caused by menopause, such as hot flashes and night sweats.  Hormone replacement carries certain risks, especially as you become older. If you are thinking about using estrogen or estrogen with progestin, discuss the benefits and risks with your health care provider. What is my risk for heart disease and stroke? The risk of heart disease, heart attack, and stroke increases as you age. One of the causes may be a change in the body's hormones during menopause. This can affect how your body uses dietary fats, triglycerides, and cholesterol. Heart attack and stroke are medical emergencies. There are many things that you can do to help prevent heart disease and  stroke. Watch your blood pressure  High blood pressure causes heart disease and increases the risk of stroke. This is more likely to develop in people who have high blood pressure readings, are of African descent, or are overweight.  Have your blood pressure checked: ? Every 3-5 years if you are 62-63 years of age. ? Every year if you are 65 years old or older. Eat a healthy diet   Eat a diet that includes plenty of vegetables, fruits, low-fat dairy products, and lean protein.  Do not eat a lot of foods that are high in solid fats, added sugars, or sodium. Get regular exercise Get regular exercise. This is one of the most important things you can do for your health. Most adults should:  Try to exercise for at least 150 minutes each week. The exercise should increase your heart rate and make you sweat (moderate-intensity exercise).  Try to do strengthening exercises at least twice each week. Do these in addition to the moderate-intensity exercise.  Spend less time sitting. Even light physical activity can be beneficial. Other tips  Work with your health care provider to achieve or maintain a healthy weight.  Do not use any products that contain nicotine or tobacco, such as cigarettes, e-cigarettes, and chewing tobacco. If you need help quitting, ask your health care provider.  Know your numbers. Ask your health care provider to check your cholesterol and your blood sugar (glucose). Continue to have your blood tested as directed by your health care provider. Do I need screening for cancer? Depending on your health history and family history, you may need to  have cancer screening at different stages of your life. This may include screening for:  Breast cancer.  Cervical cancer.  Lung cancer.  Colorectal cancer. What is my risk for osteoporosis? After menopause, you may be at increased risk for osteoporosis. Osteoporosis is a condition in which bone destruction happens more  quickly than new bone creation. To help prevent osteoporosis or the bone fractures that can happen because of osteoporosis, you may take the following actions:  If you are 62-24 years old, get at least 1,000 mg of calcium and at least 600 mg of vitamin D per day.  If you are older than age 49 but younger than age 46, get at least 1,200 mg of calcium and at least 600 mg of vitamin D per day.  If you are older than age 40, get at least 1,200 mg of calcium and at least 800 mg of vitamin D per day. Smoking and drinking excessive alcohol increase the risk of osteoporosis. Eat foods that are rich in calcium and vitamin D, and do weight-bearing exercises several times each week as directed by your health care provider. How does menopause affect my mental health? Depression may occur at any age, but it is more common as you become older. Common symptoms of depression include:  Low or sad mood.  Changes in sleep patterns.  Changes in appetite or eating patterns.  Feeling an overall lack of motivation or enjoyment of activities that you previously enjoyed.  Frequent crying spells. Talk with your health care provider if you think that you are experiencing depression. General instructions See your health care provider for regular wellness exams and vaccines. This may include:  Scheduling regular health, dental, and eye exams.  Getting and maintaining your vaccines. These include: ? Influenza vaccine. Get this vaccine each year before the flu season begins. ? Pneumonia vaccine. ? Shingles vaccine. ? Tetanus, diphtheria, and pertussis (Tdap) booster vaccine. Your health care provider may also recommend other immunizations. Tell your health care provider if you have ever been abused or do not feel safe at home. Summary  Menopause is a normal process in which your ability to get pregnant comes to an end.  This condition causes hot flashes, night sweats, decreased interest in sex, mood swings,  headaches, or lack of sleep.  Treatment for this condition may include hormone replacement therapy.  Take actions to keep yourself healthy, including exercising regularly, eating a healthy diet, watching your weight, and checking your blood pressure and blood sugar levels.  Get screened for cancer and depression. Make sure that you are up to date with all your vaccines. This information is not intended to replace advice given to you by your health care provider. Make sure you discuss any questions you have with your health care provider. Document Released: 03/22/2005 Document Revised: 01/21/2018 Document Reviewed: 01/21/2018 Elsevier Patient Education  2020 [employer].

## 2019-01-12 LAB — PAP IG W/ RFLX HPV ASCU

## 2019-01-12 LAB — HUMAN PAPILLOMAVIRUS, HIGH RISK: HPV DNA High Risk: NOT DETECTED

## 2019-03-03 ENCOUNTER — Ambulatory Visit: Payer: BC Managed Care – PPO | Attending: Internal Medicine

## 2019-03-03 DIAGNOSIS — Z20822 Contact with and (suspected) exposure to covid-19: Secondary | ICD-10-CM

## 2019-03-04 LAB — NOVEL CORONAVIRUS, NAA: SARS-CoV-2, NAA: NOT DETECTED

## 2019-03-10 ENCOUNTER — Other Ambulatory Visit: Payer: Self-pay

## 2019-03-10 ENCOUNTER — Ambulatory Visit (AMBULATORY_SURGERY_CENTER): Payer: Self-pay | Admitting: *Deleted

## 2019-03-10 VITALS — Temp 97.4°F | Ht 63.0 in | Wt 125.6 lb

## 2019-03-10 DIAGNOSIS — Z01818 Encounter for other preprocedural examination: Secondary | ICD-10-CM

## 2019-03-10 DIAGNOSIS — Z86010 Personal history of colonic polyps: Secondary | ICD-10-CM

## 2019-03-10 MED ORDER — PLENVU 140 G PO SOLR: 1.0000 | Freq: Once | ORAL | 0 refills | Status: AC

## 2019-03-10 NOTE — Progress Notes (Signed)
Pt is aware that care partner will wait in the car during procedure; if they feel like they will be too hot or cold to wait in the car; they may wait in the 4 th floor lobby. Patient is aware to bring only one care partner. We want them to wear a mask (we do not have any that we can provide them), practice social distancing, and we will check their temperatures when they get here.  I did remind the patient that their care partner needs to stay in the parking lot the entire time and have a cell phone available, we will call them when the pt is ready for discharge. Patient will wear mask into building.   No egg or soy allergy  No home oxygen use or problems with anesthesia  No medications for weight loss taken  emmi information given  covid test 03-22-19 at 3:30 p.m  [employer] coupon given to pt to take to pharmacy

## 2019-03-22 ENCOUNTER — Other Ambulatory Visit: Payer: Self-pay

## 2019-03-22 ENCOUNTER — Ambulatory Visit (INDEPENDENT_AMBULATORY_CARE_PROVIDER_SITE_OTHER): Payer: BC Managed Care – PPO

## 2019-03-22 ENCOUNTER — Other Ambulatory Visit: Payer: Self-pay | Admitting: Gastroenterology

## 2019-03-22 DIAGNOSIS — Z1159 Encounter for screening for other viral diseases: Secondary | ICD-10-CM

## 2019-03-23 LAB — SARS CORONAVIRUS 2 (TAT 6-24 HRS): SARS Coronavirus 2: NEGATIVE

## 2019-03-24 ENCOUNTER — Ambulatory Visit (AMBULATORY_SURGERY_CENTER): Payer: BC Managed Care – PPO | Admitting: Gastroenterology

## 2019-03-24 ENCOUNTER — Other Ambulatory Visit: Payer: Self-pay

## 2019-03-24 ENCOUNTER — Encounter: Payer: Self-pay | Admitting: Gastroenterology

## 2019-03-24 VITALS — BP 126/75 | HR 71 | Temp 97.7°F | Resp 16 | Ht 63.0 in | Wt 125.0 lb

## 2019-03-24 DIAGNOSIS — Z1211 Encounter for screening for malignant neoplasm of colon: Secondary | ICD-10-CM

## 2019-03-24 DIAGNOSIS — Z86010 Personal history of colonic polyps: Secondary | ICD-10-CM

## 2019-03-24 MED ORDER — SODIUM CHLORIDE 0.9 % IV SOLN: 500.0000 mL | Freq: Once | INTRAVENOUS | Status: DC

## 2019-03-24 NOTE — Progress Notes (Signed)
Pt's states no medical or surgical changes since previsit or office visit. Temp by LC.  VS by SB 

## 2019-03-24 NOTE — Op Note (Signed)
[facility] Patient Name: Deborah Meadows Procedure Date: 03/24/2019 11:34 AM MRN: [id number] Endoscopist: Mauri Pole , MD Age: 54 Referring MD:  Date of Birth: 02-21-1965 Gender: Female Account #: [id number] Procedure:                Colonoscopy Indications:              High risk colon cancer surveillance: Personal                            history of adenoma (10 mm or greater in size) Medicines:                Monitored Anesthesia Care Procedure:                Pre-Anesthesia Assessment:                           - Prior to the procedure, a History and Physical                            was performed, and patient medications and                            allergies were reviewed. The patient's tolerance of                            previous anesthesia was also reviewed. The risks                            and benefits of the procedure and the sedation                            options and risks were discussed with the patient.                            All questions were answered, and informed consent                            was obtained. Prior Anticoagulants: The patient has                            taken no previous anticoagulant or antiplatelet                            agents. ASA Grade Assessment: II - A patient with                            mild systemic disease. After reviewing the risks                            and benefits, the patient was deemed in                            satisfactory condition to undergo the procedure.  After obtaining informed consent, the colonoscope                            was passed under direct vision. Throughout the                            procedure, the patient's blood pressure, pulse, and                            oxygen saturations were monitored continuously. The                            Colonoscope was introduced through the anus and                            advanced to  the the cecum, identified by                            appendiceal orifice and ileocecal valve. The                            colonoscopy was performed without difficulty. The                            patient tolerated the procedure well. The quality                            of the bowel preparation was excellent. The                            ileocecal valve, appendiceal orifice, and rectum                            were photographed. Scope In: 11:38:38 AM Scope Out: 11:51:39 AM Scope Withdrawal Time: 0 hours 6 minutes 39 seconds  Total Procedure Duration: 0 hours 13 minutes 1 second  Findings:                 The perianal and digital rectal examinations were                            normal.                           A few small-mouthed diverticula were found in the                            sigmoid colon.                           Non-bleeding internal hemorrhoids were found during                            retroflexion. The hemorrhoids were small.  The exam was otherwise without abnormality. Complications:            No immediate complications. Estimated Blood Loss:     Estimated blood loss was minimal. Impression:               - Diverticulosis in the sigmoid colon.                           - Non-bleeding internal hemorrhoids.                           - The examination was otherwise normal.                           - No specimens collected. Recommendation:           - Patient has a contact number available for                            emergencies. The signs and symptoms of potential                            delayed complications were discussed with the                            patient. Return to normal activities tomorrow.                            Written discharge instructions were provided to the                            patient.                           - Resume previous diet.                           - Continue present  medications.                           - Repeat colonoscopy in 5 years for screening                            purposes. Mauri Pole, MD 03/24/2019 11:54:26 AM This report has been signed electronically.

## 2019-03-24 NOTE — Patient Instructions (Signed)
YOU HAD AN ENDOSCOPIC PROCEDURE TODAY AT THE [facility]:   Refer to the procedure report that was given to you for any specific questions about what was found during the examination.  If the procedure report does not answer your questions, please call your gastroenterologist to clarify.  If you requested that your care partner not be given the details of your procedure findings, then the procedure report has been included in a sealed envelope for you to review at your convenience later.  YOU SHOULD EXPECT: Some feelings of bloating in the abdomen. Passage of more gas than usual.  Walking can help get rid of the air that was put into your GI tract during the procedure and reduce the bloating. If you had a lower endoscopy (such as a colonoscopy or flexible sigmoidoscopy) you may notice spotting of blood in your stool or on the toilet paper. If you underwent a bowel prep for your procedure, you may not have a normal bowel movement for a few days.  Please Note:  You might notice some irritation and congestion in your nose or some drainage.  This is from the oxygen used during your procedure.  There is no need for concern and it should clear up in a day or so.  SYMPTOMS TO REPORT IMMEDIATELY:   Following lower endoscopy (colonoscopy or flexible sigmoidoscopy):  Excessive amounts of blood in the stool  Significant tenderness or worsening of abdominal pains  Swelling of the abdomen that is new, acute  Fever of 100F or higher   For urgent or emergent issues, a gastroenterologist can be reached at any hour by calling [phone].   DIET:  We do recommend a small meal at first, but then you may proceed to your regular diet.  Drink plenty of fluids but you should avoid alcoholic beverages for 24 hours.  ACTIVITY:  You should plan to take it easy for the rest of today and you should NOT DRIVE or use heavy machinery until tomorrow (because of the sedation medicines used during the test).     FOLLOW UP: Our staff will call the number listed on your records 48-72 hours following your procedure to check on you and address any questions or concerns that you may have regarding the information given to you following your procedure. If we do not reach you, we will leave a message.  We will attempt to reach you two times.  During this call, we will ask if you have developed any symptoms of COVID 19. If you develop any symptoms (ie: fever, flu-like symptoms, shortness of breath, cough etc.) before then, please call [phone].  If you test positive for Covid 19 in the 2 weeks post procedure, please call and report this information to us.    If any biopsies were taken you will be contacted by phone or by letter within the next 1-3 weeks.  Please call us at [phone] if you have not heard about the biopsies in 3 weeks.    SIGNATURES/CONFIDENTIALITY: You and/or your care partner have signed paperwork which will be entered into your electronic medical record.  These signatures attest to the fact that that the information above on your After Visit Summary has been reviewed and is understood.  Full responsibility of the confidentiality of this discharge information lies with you and/or your care-partner.   Thank you for allowing us to provide your healthcare today.   

## 2019-03-24 NOTE — Progress Notes (Signed)
To PACU, VSS. Report to [email or web address] 

## 2019-03-26 ENCOUNTER — Telehealth: Payer: Self-pay

## 2019-03-26 NOTE — Telephone Encounter (Signed)
  Follow up Call-  Call back number 03/24/2019  Post procedure Call Back phone  # [phone]  Permission to leave phone message Yes  Some recent data might be hidden     Patient questions:  Do you have a fever, pain , or abdominal swelling? No. Pain Score  0 *  Have you tolerated food without any problems? Yes.    Have you been able to return to your normal activities? Yes.    Do you have any questions about your discharge instructions: Diet   No. Medications  No. Follow up visit  No.  Do you have questions or concerns about your Care? No.  Actions: * If pain score is 4 or above: No action needed, pain <4.  Have you developed a fever since your procedure? No 2.   Have you had an respiratory symptoms (SOB or cough) since your procedure? No  3.   Have you tested positive for COVID 19 since your procedure No  4.   Have you had any family members/close contacts diagnosed with the COVID 19 since your procedure? No   If yes to any of these questions please route to Joylene John, RN and Alphonsa Gin, RN.

## 2019-04-07 ENCOUNTER — Other Ambulatory Visit: Payer: Self-pay

## 2019-04-07 ENCOUNTER — Ambulatory Visit
Admission: RE | Admit: 2019-04-07 | Discharge: 2019-04-07 | Disposition: A | Discharge status: Home / Self Care | Payer: BC Managed Care – PPO | Source: Ambulatory Visit | Attending: Family Medicine | Admitting: Family Medicine

## 2019-04-07 DIAGNOSIS — Z1231 Encounter for screening mammogram for malignant neoplasm of breast: Secondary | ICD-10-CM

## 2019-06-07 ENCOUNTER — Ambulatory Visit: Payer: BC Managed Care – PPO | Attending: Internal Medicine

## 2019-06-07 DIAGNOSIS — Z20822 Contact with and (suspected) exposure to covid-19: Secondary | ICD-10-CM

## 2019-06-08 LAB — NOVEL CORONAVIRUS, NAA: SARS-CoV-2, NAA: NOT DETECTED

## 2019-06-08 LAB — SARS-COV-2, NAA 2 DAY TAT

## 2019-06-09 ENCOUNTER — Telehealth: Payer: Self-pay | Admitting: Family Medicine

## 2019-06-09 NOTE — Telephone Encounter (Signed)
Patient called and received her negative covid test result

## 2019-08-11 DIAGNOSIS — H5213 Myopia, bilateral: Secondary | ICD-10-CM | POA: Diagnosis not present

## 2019-08-11 DIAGNOSIS — H04123 Dry eye syndrome of bilateral lacrimal glands: Secondary | ICD-10-CM | POA: Diagnosis not present

## 2019-09-01 DIAGNOSIS — Z20822 Contact with and (suspected) exposure to covid-19: Secondary | ICD-10-CM | POA: Diagnosis not present

## 2019-11-03 ENCOUNTER — Telehealth: Payer: Self-pay | Admitting: *Deleted

## 2019-11-03 NOTE — Telephone Encounter (Signed)
Patient advised.

## 2019-11-03 NOTE — Telephone Encounter (Signed)
Patient called and said she has some weird things going on. She has fluid on the back of her L knee that feels squishy, and pains around her left calf that come and go. No redness and does not feel warm. She also has some pain around her L collarbone that comes and goes. A friend of hers told her that it could be a blot clot. She is concerned but doesn't want to come in if there isn't anything we can do here. I told her that upon exam if you felt that she needed you would send her for an u/s. She wanted me to ask you your thoughts before she schedules anything.

## 2019-11-03 NOTE — Telephone Encounter (Signed)
The "squishy" behind the knee could possibly be a Baker's cyst.  If she isn't having pain/swelling that is worsening, I'd start by taking anti-inflammatories for a few days and see how she responds.  If she has increasing swelling, redness, pain, then she needs to come in. Not really sure about the collarbone issue, but if she is taking anti-inflammatories for the knee/calf, we can see if that resolves also.

## 2019-11-10 NOTE — Patient Instructions (Addendum)
HEALTH MAINTENANCE RECOMMENDATIONS:  It is recommended that you get at least 30 minutes of aerobic exercise at least 5 days/week (for weight loss, you may need as much as 60-90 minutes). This can be any activity that gets your heart rate up. This can be divided in 10-15 minute intervals if needed, but try and build up your endurance at least once a week.  Weight bearing exercise is also recommended twice weekly.  Eat a healthy diet with lots of vegetables, fruits and fiber.  "Colorful" foods have a lot of vitamins (ie green vegetables, tomatoes, red peppers, etc).  Limit sweet tea, regular sodas and alcoholic beverages, all of which has a lot of calories and sugar.  Up to 1 alcoholic drink daily may be beneficial for women (unless trying to lose weight, watch sugars).  Drink a lot of water.  Calcium recommendations are 1200-1500 mg daily (1500 mg for postmenopausal women or women without ovaries), and vitamin D 1000 IU daily.  This should be obtained from diet and/or supplements (vitamins), and calcium should not be taken all at once, but in divided doses.  Monthly self breast exams and yearly mammograms for women over the age of 22 is recommended.  Sunscreen of at least SPF 30 should be used on all sun-exposed parts of the skin when outside between the hours of 10 am and 4 pm (not just when at beach or pool, but even with exercise, golf, tennis, and yard work!)  Use a sunscreen that says "broad spectrum" so it covers both UVA and UVB rays, and make sure to reapply every 1-2 hours.  Remember to change the batteries in your smoke detectors when changing your clock times in the spring and fall. Carbon monoxide detectors are recommended for your home.  Use your seat belt every time you are in a car, and please drive safely and not be distracted with cell phones and texting while driving.  I think you have some varicose veins (mild) in the left leg.  I recommend a trial of compression stockings  (probably thigh high, given the location of the veins into the upper leg). You should wear these daily while working. You may also have a Baker's cyst.  If you are having pain, you can try taking anti-inflammatories (ie Aleve twice daily for a week or so).  Periodically check your blood pressure at home, record on the page provided. Bring this list when you come for nurse visit to check the accuracy of your monitor. See info below regarding low sodium diet. Also continue regular exercise and work on stress reduction.  Varicose Veins Varicose veins are veins that have become enlarged, bulged, and twisted. They most often appear in the legs. What are the causes? This condition is caused by damage to the valves in the vein. These valves help blood return to your heart. When they are damaged and they stop working properly, blood may flow backward and back up in the veins near the skin, causing the veins to get larger and appear twisted. The condition can result from any issue that causes blood to back up, like pregnancy, prolonged standing, or obesity. What increases the risk? This condition is more likely to develop in people who are:  On their feet a lot.  Pregnant.  Overweight. What are the signs or symptoms? Symptoms of this condition include:  Bulging, twisted, and bluish veins.  A feeling of heaviness. This may be worse at the end of the day.  Leg pain. This  may be worse at the end of the day.  Swelling in the leg.  Changes in skin color over the veins. How is this diagnosed? This condition may be diagnosed based on your symptoms, a physical exam, and an ultrasound test. How is this treated? Treatment for this condition may involve:  Avoiding sitting or standing in one position for long periods of time.  Wearing compression stockings. These stockings help to prevent blood clots and reduce swelling in the legs.  Raising (elevating) the legs when resting.  Losing  weight.  Exercising regularly. If you have persistent symptoms or want to improve the way your varicose veins look, you may choose to have a procedure to close the varicose veins off or to remove them. Treatments to close off the veins include:  Sclerotherapy. In this treatment, a solution is injected into a vein to close it off.  Laser treatment. In this treatment, the vein is heated with a laser to close it off.  Radiofrequency vein ablation. In this treatment, an electrical current produced by radio waves is used to close off the vein. Treatments to remove the veins include:  Phlebectomy. In this treatment, the veins are removed through small incisions made over the veins.  Vein ligation and stripping. In this treatment, incisions are made over the veins. The veins are then removed after being tied (ligated) with stitches (sutures). Follow these instructions at home: Activity  Walk as much as possible. Walking increases blood flow. This helps blood return to the heart and takes pressure off your veins. It also increases your cardiovascular strength.  Follow your health care provider's instructions about exercising.  Do not stand or sit in one position for a long period of time.  Do not sit with your legs crossed.  Rest with your legs raised during the day. General instructions   Follow any diet instructions given to you by your health care provider.  Wear compression stockings as directed by your health care provider. Do not wear other kinds of tight clothing around your legs, pelvis, or waist.  Elevate your legs at night to above the level of your heart.  If you get a cut in the skin over the varicose vein and the vein bleeds: ? Lie down with your leg raised. ? Apply firm pressure to the cut with a clean cloth until the bleeding stops. ? Place a bandage (dressing) on the cut. Contact a health care provider if:  The skin around your varicose veins starts to break  down.  You have pain, redness, tenderness, or hard swelling over a vein.  You are uncomfortable because of pain.  You get a cut in the skin over a varicose vein and it will not stop bleeding. Summary  Varicose veins are veins that have become enlarged, bulged, and twisted. They most often appear in the legs.  This condition is caused by damage to the valves in the vein. These valves help blood return to your heart.  Treatment for this condition includes frequent movements, wearing compression stockings, losing weight, and exercising regularly. In some cases, procedures are done to close off or remove the veins.  Treatment for this condition may include wearing compression stockings, elevating the legs, losing weight, and engaging in regular activity. In some cases, procedures are done to close off or remove the veins. This information is not intended to replace advice given to you by your health care provider. Make sure you discuss any questions you have with your health care  provider. Document Revised: 03/26/2018 Document Reviewed: 02/21/2016 Elsevier Patient Education  [place] Cyst  A Baker cyst, also called a popliteal cyst, is a growth that forms at the back of the knee. The cyst forms when the fluid-filled sac (bursa) that cushions the knee joint becomes enlarged. What are the causes? In most cases, a Baker cyst results from another knee problem that causes swelling inside the knee. This makes the fluid inside the knee joint (synovial fluid) flow into the bursa behind the knee, causing the bursa to enlarge. What increases the risk? You may be more likely to develop a Baker cyst if you already have a knee problem, such as:  A tear in cartilage that cushions the knee joint (meniscal tear).  A tear in the tissues that connect the bones of the knee joint (ligament tear).  Knee swelling from osteoarthritis, rheumatoid arthritis, or gout. What are the signs or  symptoms? The main symptom of this condition is a lump behind the knee. This may be the only symptom of the condition. The lump may be painful, especially when the knee is straightened. If the lump is painful, the pain may come and go. The knee may also be stiff. Symptoms may quickly get more severe if the cyst breaks open (ruptures). If the cyst ruptures, you may feel the following in your knee and calf:  Sudden or worsening pain.  Swelling.  Bruising.  Redness in the calf. A Baker cyst does not always cause symptoms. How is this diagnosed? This condition may be diagnosed based on your symptoms and medical history. Your health care provider will also do a physical exam. This may include:  Feeling the cyst to check whether it is tender.  Checking your knee for signs of another knee condition that causes swelling. You may have imaging tests, such as:  X-rays.  MRI.  Ultrasound. How is this treated? A Baker cyst that is not painful may go away without treatment. If the cyst gets large or painful, it will likely get better if the underlying knee problem is treated. If needed, treatment for a Baker cyst may include:  Resting.  Keeping weight off of the knee. This means not leaning on the knee to support your body weight.  Taking NSAIDs, such as ibuprofen, to reduce pain and swelling.  Having a procedure to drain the fluid from the cyst with a needle (aspiration). You may also get an injection of a medicine that reduces swelling (steroid).  Having surgery. This may be needed if other treatments do not work. This usually involves correcting knee damage and removing the cyst. Follow these instructions at home:  Activity  Rest as told by your health care provider.  Avoid activities that make pain or swelling worse.  Return to your normal activities as told by your health care provider. Ask your health care provider what activities are safe for you.  Do not use the injured limb  to support your body weight until your health care provider says that you can. Use crutches as told by your health care provider. General instructions  Take over-the-counter and prescription medicines only as told by your health care provider.  Keep all follow-up visits as told by your health care provider. This is important. Contact a health care provider if:  You have knee pain, stiffness, or swelling that does not get better. Get help right away if:  You have sudden or worsening pain and swelling in your calf area.  Summary  A Baker cyst, also called a popliteal cyst, is a growth that forms at the back of the knee.  In most cases, a Baker cyst results from another knee problem that causes swelling inside the knee.  A Baker cyst that is not painful may go away without treatment.  If needed, treatment for a Baker cyst may include resting, keeping weight off of the knee, medicines, or draining fluid from the cyst.  Surgery may be needed if other treatments are not effective. This information is not intended to replace advice given to you by your health care provider. Make sure you discuss any questions you have with your health care provider. Document Revised: 06/12/2018 Document Reviewed: 06/12/2018 Elsevier Patient Education  [place] DASH stands for "Dietary Approaches to Stop Hypertension." The DASH eating plan is a healthy eating plan that has been shown to reduce high blood pressure (hypertension). It may also reduce your risk for type 2 diabetes, heart disease, and stroke. The DASH eating plan may also help with weight loss. What are tips for following this plan?  General guidelines  Avoid eating more than 2,300 mg (milligrams) of salt (sodium) a day. If you have hypertension, you may need to reduce your sodium intake to 1,500 mg a day.  Limit alcohol intake to no more than 1 drink a day for nonpregnant women and 2 drinks a day for men. One  drink equals 12 oz of beer, 5 oz of wine, or 1 oz of hard liquor.  Work with your health care provider to maintain a healthy body weight or to lose weight. Ask what an ideal weight is for you.  Get at least 30 minutes of exercise that causes your heart to beat faster (aerobic exercise) most days of the week. Activities may include walking, swimming, or biking.  Work with your health care provider or diet and nutrition specialist (dietitian) to adjust your eating plan to your individual calorie needs. Reading food labels   Check food labels for the amount of sodium per serving. Choose foods with less than 5 percent of the Daily Value of sodium. Generally, foods with less than 300 mg of sodium per serving fit into this eating plan.  To find whole grains, look for the word "whole" as the first word in the ingredient list. Shopping  Buy products labeled as "low-sodium" or "no salt added."  Buy fresh foods. Avoid canned foods and premade or frozen meals. Cooking  Avoid adding salt when cooking. Use salt-free seasonings or herbs instead of table salt or sea salt. Check with your health care provider or pharmacist before using salt substitutes.  Do not fry foods. Cook foods using healthy methods such as baking, boiling, grilling, and broiling instead.  Cook with heart-healthy oils, such as olive, canola, soybean, or sunflower oil. Meal planning  Eat a balanced diet that includes: ? 5 or more servings of fruits and vegetables each day. At each meal, try to fill half of your plate with fruits and vegetables. ? Up to 6-8 servings of whole grains each day. ? Less than 6 oz of lean meat, poultry, or fish each day. A 3-oz serving of meat is about the same size as a deck of cards. One egg equals 1 oz. ? 2 servings of low-fat dairy each day. ? A serving of nuts, seeds, or beans 5 times each week. ? Heart-healthy fats. Healthy fats called Omega-3 fatty acids are found in foods such  as flaxseeds and  coldwater fish, like sardines, salmon, and mackerel.  Limit how much you eat of the following: ? Canned or prepackaged foods. ? Food that is high in trans fat, such as fried foods. ? Food that is high in saturated fat, such as fatty meat. ? Sweets, desserts, sugary drinks, and other foods with added sugar. ? Full-fat dairy products.  Do not salt foods before eating.  Try to eat at least 2 vegetarian meals each week.  Eat more home-cooked food and less restaurant, buffet, and fast food.  When eating at a restaurant, ask that your food be prepared with less salt or no salt, if possible. What foods are recommended? The items listed may not be a complete list. Talk with your dietitian about what dietary choices are best for you. Grains Whole-grain or whole-wheat bread. Whole-grain or whole-wheat pasta. Brown rice. [place]. Bulgur. Whole-grain and low-sodium cereals. Pita bread. Low-fat, low-sodium crackers. Whole-wheat flour tortillas. Vegetables Fresh or frozen vegetables (raw, steamed, roasted, or grilled). Low-sodium or reduced-sodium tomato and vegetable juice. Low-sodium or reduced-sodium tomato sauce and tomato paste. Low-sodium or reduced-sodium canned vegetables. Fruits All fresh, dried, or frozen fruit. Canned fruit in natural juice (without added sugar). Meat and other protein foods Skinless chicken or [place]. Ground chicken or [place]. Pork with fat trimmed off. Fish and seafood. Egg whites. Dried beans, peas, or lentils. Unsalted nuts, nut butters, and seeds. Unsalted canned beans. Lean cuts of beef with fat trimmed off. Low-sodium, lean deli meat. Dairy Low-fat (1%) or fat-free (skim) milk. Fat-free, low-fat, or reduced-fat cheeses. Nonfat, low-sodium ricotta or cottage cheese. Low-fat or nonfat yogurt. Low-fat, low-sodium cheese. Fats and oils Soft margarine without trans fats. Vegetable oil. Low-fat, reduced-fat, or light mayonnaise and salad dressings (reduced-sodium).  Canola, safflower, olive, soybean, and sunflower oils. Avocado. Seasoning and other foods Herbs. Spices. Seasoning mixes without salt. Unsalted popcorn and pretzels. Fat-free sweets. What foods are not recommended? The items listed may not be a complete list. Talk with your dietitian about what dietary choices are best for you. Grains Baked goods made with fat, such as croissants, muffins, or some breads. Dry pasta or rice meal packs. Vegetables Creamed or fried vegetables. Vegetables in a cheese sauce. Regular canned vegetables (not low-sodium or reduced-sodium). Regular canned tomato sauce and paste (not low-sodium or reduced-sodium). Regular tomato and vegetable juice (not low-sodium or reduced-sodium). Angie Fava. Olives. Fruits Canned fruit in a light or heavy syrup. Fried fruit. Fruit in cream or butter sauce. Meat and other protein foods Fatty cuts of meat. Ribs. Fried meat. Berniece Salines. Sausage. Bologna and other processed lunch meats. Salami. Fatback. Hotdogs. Bratwurst. Salted nuts and seeds. Canned beans with added salt. Canned or smoked fish. Whole eggs or egg yolks. Chicken or [place] with skin. Dairy Whole or 2% milk, cream, and half-and-half. Whole or full-fat cream cheese. Whole-fat or sweetened yogurt. Full-fat cheese. Nondairy creamers. Whipped toppings. Processed cheese and cheese spreads. Fats and oils Butter. Stick margarine. Lard. Shortening. Ghee. Bacon fat. Tropical oils, such as coconut, palm kernel, or palm oil. Seasoning and other foods Salted popcorn and pretzels. Onion salt, garlic salt, seasoned salt, table salt, and sea salt. Worcestershire sauce. Tartar sauce. Barbecue sauce. Teriyaki sauce. Soy sauce, including reduced-sodium. Steak sauce. Canned and packaged gravies. Fish sauce. Oyster sauce. Cocktail sauce. Horseradish that you find on the shelf. Ketchup. Mustard. Meat flavorings and tenderizers. Bouillon cubes. Hot sauce and Tabasco sauce. Premade or packaged marinades.  Premade or packaged taco seasonings. Relishes. Regular salad  dressings. Where to find more information:  National Heart, Lung, and [place]: https://wilson-eaton.com/  American Heart Association: www.heart.org Summary  The DASH eating plan is a healthy eating plan that has been shown to reduce high blood pressure (hypertension). It may also reduce your risk for type 2 diabetes, heart disease, and stroke.  With the DASH eating plan, you should limit salt (sodium) intake to 2,300 mg a day. If you have hypertension, you may need to reduce your sodium intake to 1,500 mg a day.  When on the DASH eating plan, aim to eat more fresh fruits and vegetables, whole grains, lean proteins, low-fat dairy, and heart-healthy fats.  Work with your health care provider or diet and nutrition specialist (dietitian) to adjust your eating plan to your individual calorie needs. This information is not intended to replace advice given to you by your health care provider. Make sure you discuss any questions you have with your health care provider. Document Revised: 01/10/2017 Document Reviewed: 01/22/2016 Elsevier Patient Education  2020 [employer].

## 2019-11-10 NOTE — Progress Notes (Signed)
Chief Complaint  Patient presents with  . Annual Exam    fasting annual exam no pap-sees [name]. Still having issues with her calves and behind knees and now up into her things. Feels like her heart has been racing over the past week.   . Immunizations    had COVID vaccines, card is in car-she will call me when she leaves. Given flu shot today. Did not check insurance re: shingles vaccine.     Deborah Meadows is a 54 y.o. female who presents for a complete physical.  She has the following concerns:  She has fluid on the back of her L knee that feels squishy, and has had pains around her left calf that come and go. No redness and does not feel warm. Today she also noted on the right side there is some swelling laterally. 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 Gets pain in her left calf muscle, and sometimes into the left upper hamstring. She has noticed the swelling for weeks. A fireman friend mentioned it could be a blood clot--she admits she freaked out when she was told it could be a blood clot. Her heart has been racing over the past week. She also had some pain that shot up under her L collarbone that scared her. She also has some discomfort under the left collarbone, sometimes on the right (different than the shooting she felt once). She has some tightness in her chest and feels like her heart sometimes is racing.  Denies any change in stress (other than being worried about a blood clot after it was mentioned).  She reports feeling a few extra beats, short-lived, no prolonged tachycardia. No associated dizziness Recalls once feeling like her "head got big" while noting the heart palpitations.  She is walking daily--noticed palpitations while walking, but no different, gets at rest as well.  Only started 7 days ago. No change in caffeine, 1/2 cup for the last 2 days, some days without any. Denies use of decongestants.  BP was noted to be elevated today.  Not checked elsewhere.   Previously has had elevated BP's at the doctor and needed to monitor.  She denies increase in sodium intake--lots of pizza, occasional chips.  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 She recalls having a heart monitor and ETT, reportedly normal.  February2018 she presented with a neck mass, found to be a benign cyst per ultrasound (1cm arising exophytic from L aspect of thyroid isthmus; also had scattered bilateral tiny thyroid nodules). She denies any changes, pain.   Currently denies any difficulty swallowing or symptoms related to the lump.  Vitamin D deficiency was notedin 2017, with a level of 26.Level was 52.7 last year, when taking 5000 IU 3-4 times/week. She continues to take this dose.  Immunization History  Administered Date(s) Administered  . Influenza Split 11/01/2010  . Influenza,inj,Quad PF,6+ Mos 11/02/2012, 10/05/2015, 10/09/2016, 10/15/2017, 10/28/2018, 11/11/2019  . Tdap 11/02/2012   She had her COVID vaccines Last Pap smear:12/2018 ASCUS, no high risk HPV (same as prior paps), per GYN Last mammogram:03/2019 Last colonoscopy:03/2019--diverticulosis, small internal hemorrhoids.  No polyps.  Repeat 5 years (had 10m tubular adenoma in 02/2016). Last DEXA: never (looks like one was ordered by GYN in 12/2018) Dentist:twice yearly Ophtho: yearly Exercise: Walks an hour every day (if too busy, then just 20 minutes at night). 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 Lipid screen: Lab Results  Component Value Date   CHOL 262 (H) 10/28/2018   HDL 118 10/28/2018   LDLCALC 137 (H) 10/28/2018   TRIG 45 10/28/2018   CHOLHDL 2.2 10/28/2018    PMH, PSH, SH and FH were reviewed and updated  Outpatient Encounter Medications as of 11/11/2019  Medication Sig Note  . triamcinolone cream (KENALOG) 0.1 % APPLY TO ACTIVE RASH TWICE A DAY AS NEEDED   . VITAMIN D PO  Take 5,000 Int'l Units by mouth daily. 10/28/2018: Takes 3-4 times/week   No facility-administered encounter medications on file as of 11/11/2019.   Allergies  Allergen Reactions  . Penicillins Hives and Itching    Has patient had a PCN reaction causing immediate rash, facial/tongue/throat swelling, SOB or lightheadedness with hypotension: Yes Has patient had a PCN reaction causing severe rash involving mucus membranes or skin necrosis: No Has patient had a PCN reaction that required hospitalization No Has patient had a PCN reaction occurring within the last 10 years: No If all of the above answers are "NO", then may proceed with Cephalosporin use.     ROS: The patient denies anorexia, fever, weight changes, headaches, vision changes, decreased hearing, ear pain, sore throat, breast concerns, dizziness, syncope, dyspnea on exertion, cough, swelling, nausea, vomiting, diarrhea, constipation, abdominal pain, melena, hematochezia, indigestion/heartburn, hematuria, incontinence, dysuria, vaginal bleeding, discharge, odor or itch, genital lesions, joint pains, numbness, tingling, weakness, tremor, suspicious skin lesions, depression, anxiety, abnormal bleeding/bruising, or enlarged lymph nodes. Stress incontinence with sneezing. Postmenopausal. Only mild hot flashes.No sweats. Some chronic postnasal drainage. Some knee pain (and crunching on the right) related to a fall in the past Discomfort in LLE, swelling behind left knee.  Intermittent discomfort under her collarbones. Palpitations x 1 week--started after the fireman mentioned she could have a blood clot ear eczema--occasionally flares behind her ears   PHYSICAL EXAM:  BP (!) 180/110   Pulse 84   Ht 5' 3"  (1.6 m)   Wt 127 lb (57.6 kg)   BMI 22.50 kg/m   Repeat BP unchanged  Wt Readings from Last 3 Encounters:  03/24/19 125 lb (56.7 kg)  03/10/19 125 lb 9.6 oz (57 kg)  01/05/19 121 lb 12.8 oz (55.2 kg)   General Appearance:   Alert, cooperative, no distress, appears stated age. Somewhat excitable, anxious  Head:  Normocephalic, without obvious abnormality, atraumatic  Eyes:  PERRL, conjunctiva/corneas clear, EOM's intact, fundi benign  Ears:  Normal TM's and external ear canals.  Nose: Not examined, wearing mask due to COVID-19 pandemic  Throat: Not examined, wearing mask due to COVID-19 pandemic  Neck: Supple, no lymphadenopathy; thyroid:nontender. There is a 1-1.5cm soft, superficial, mobile mass just to the left of midline over thyroid, unchanged from prior visit. No carotidbruit or JVD  Back:  Spine nontender, no curvature, ROM normal, no CVA tenderness  Lungs:  Clear to auscultation bilaterally without wheezes, rales or ronchi;  respirations unlabored  Chest Wall:  No tenderness or deformity  Heart:  Regular rate and rhythm, S1 and S2 normal, no murmur, rub or gallop. No ectopy noted.  Breast Exam:  Deferred to GYN  Abdomen:  Soft, non-tender, nondistended, normoactive bowel sounds, no masses, no hepatosplenomegaly  Genitalia:  Deferred to GYN     Extremities: No clubbing, cyanosis or edema. See below for additional exam.  Pulses: 2+ and symmetric all extremities  Skin: Skin color, texture, turgor normal. Exam limited (not changed into gown, wearing mask)  Lymph nodes: Cervical,  supraclavicular, and axillary nodes normal  Neurologic: Normal strength, sensation and gait; reflexes 2+ and symmetric throughout  Psych: Normal mood, affect, hygiene and grooming  Small cystic collections bilaterally, noted laterally in popliteal area. . There is also some swelling more centrally and inferiorly at the popliteal fossa. No erythema, warmth, cords, nontender.  Some mild varicosities noted on the L leg, at calf, inferior to the popliteal fossa, and in central area of upper posterior thigh. Nontender, no cords.  EKG: NSR, normal.     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 May need to refer back to cardiology if persists - Plan: CBC with Differential/Platelet, TSH, EKG 12-Lead  Elevated blood pressure reading - pt excitable/anxious. To monitor BP regularly. Low Na diet reviewed. Pt asymptomatic today with her elevated BP - Plan: Comprehensive metabolic panel, EKG 01-QQUI  Synovial cyst of popliteal space, unspecified laterality - L>R  Varicose veins of left lower extremity with pain - veins are mild; encouraged trial of compression stocking (thigh-high since has sx into her thigh)  Wants all labs done, as before. Desires everything done yearly, insurance covers. c-met, lipids, TSH, CBC, Vit D; also HIV, since never checked (to close care gap)  Pt to monitor BP at home, NV to verify accuracy of her monitor, and f/u with list of BP's in 4-6 weeks.  Discussed monthly self breast exams and yearly mammograms; at least 30 minutes of aerobic activity at least 5 days/week, weight-bearing exercise at least 2x/week; proper sunscreen use reviewed; healthy diet, including goals of calcium and vitamin D intake and alcohol recommendations (less than or equal to 1 drink/day) reviewed; regular seatbelt use; changing batteries in smoke detectors, carbon monoxide detectors. Immunization recommendations discussed--flu shot today. Shingrix discussed, recommended. To check insurance and return for NV when  desired.Colonoscopy recommendations reviewed, UTD (due again 03/2024)

## 2019-11-11 ENCOUNTER — Encounter: Payer: Self-pay | Admitting: Family Medicine

## 2019-11-11 ENCOUNTER — Other Ambulatory Visit: Payer: Self-pay

## 2019-11-11 ENCOUNTER — Ambulatory Visit (INDEPENDENT_AMBULATORY_CARE_PROVIDER_SITE_OTHER): Payer: BC Managed Care – PPO | Admitting: Family Medicine

## 2019-11-11 VITALS — BP 180/110 | HR 84 | Ht 63.0 in | Wt 127.0 lb

## 2019-11-11 DIAGNOSIS — E041 Nontoxic single thyroid nodule: Secondary | ICD-10-CM

## 2019-11-11 DIAGNOSIS — M7120 Synovial cyst of popliteal space [Baker], unspecified knee: Secondary | ICD-10-CM

## 2019-11-11 DIAGNOSIS — R030 Elevated blood-pressure reading, without diagnosis of hypertension: Secondary | ICD-10-CM

## 2019-11-11 DIAGNOSIS — R002 Palpitations: Secondary | ICD-10-CM | POA: Diagnosis not present

## 2019-11-11 DIAGNOSIS — Z23 Encounter for immunization: Secondary | ICD-10-CM

## 2019-11-11 DIAGNOSIS — Z0000 Encounter for general adult medical examination without abnormal findings: Secondary | ICD-10-CM

## 2019-11-11 DIAGNOSIS — E559 Vitamin D deficiency, unspecified: Secondary | ICD-10-CM | POA: Diagnosis not present

## 2019-11-11 DIAGNOSIS — I83812 Varicose veins of left lower extremities with pain: Secondary | ICD-10-CM

## 2019-11-11 LAB — POCT URINALYSIS DIP (PROADVANTAGE DEVICE)
Bilirubin, UA: NEGATIVE
Blood, UA: NEGATIVE
Glucose, UA: NEGATIVE mg/dL
Ketones, POC UA: NEGATIVE mg/dL
Leukocytes, UA: NEGATIVE
Nitrite, UA: NEGATIVE
Protein Ur, POC: NEGATIVE mg/dL
Specific Gravity, Urine: 1.01
Urobilinogen, Ur: NEGATIVE
pH, UA: 6 (ref 5.0–8.0)

## 2019-11-12 ENCOUNTER — Encounter: Payer: Self-pay | Admitting: Family Medicine

## 2019-11-12 LAB — LIPID PANEL
Chol/HDL Ratio: 2.6 ratio (ref 0.0–4.4)
Cholesterol, Total: 280 mg/dL — ABNORMAL HIGH (ref 100–199)
HDL: 108 mg/dL (ref >39)
LDL Chol Calc (NIH): 163 mg/dL — ABNORMAL HIGH (ref 0–99)
Triglycerides: 59 mg/dL (ref 0–149)
VLDL Cholesterol Cal: 9 mg/dL (ref 5–40)

## 2019-11-12 LAB — COMPREHENSIVE METABOLIC PANEL
ALT: 21 IU/L (ref 0–32)
AST: 33 IU/L (ref 0–40)
Albumin/Globulin Ratio: 1.8 (ref 1.2–2.2)
Albumin: 4.6 g/dL (ref 3.8–4.9)
Alkaline Phosphatase: 65 IU/L (ref 44–121)
BUN/Creatinine Ratio: 14 (ref 9–23)
BUN: 9 mg/dL (ref 6–24)
Bilirubin Total: 0.5 mg/dL (ref 0.0–1.2)
CO2: 24 mmol/L (ref 20–29)
Calcium: 9.6 mg/dL (ref 8.7–10.2)
Chloride: 99 mmol/L (ref 96–106)
Creatinine, Ser: 0.65 mg/dL (ref 0.57–1.00)
GFR calc Af Amer: 116 mL/min/{1.73_m2} (ref >59)
GFR calc non Af Amer: 100 mL/min/{1.73_m2} (ref >59)
Globulin, Total: 2.6 g/dL (ref 1.5–4.5)
Glucose: 97 mg/dL (ref 65–99)
Potassium: 4.2 mmol/L (ref 3.5–5.2)
Sodium: 139 mmol/L (ref 134–144)
Total Protein: 7.2 g/dL (ref 6.0–8.5)

## 2019-11-12 LAB — CBC WITH DIFFERENTIAL/PLATELET
Basophils Absolute: 0.1 10*3/uL (ref 0.0–0.2)
Basos: 1 %
EOS (ABSOLUTE): 0 10*3/uL (ref 0.0–0.4)
Eos: 1 %
Hematocrit: 37.7 % (ref 34.0–46.6)
Hemoglobin: 12.8 g/dL (ref 11.1–15.9)
Immature Grans (Abs): 0 10*3/uL (ref 0.0–0.1)
Immature Granulocytes: 0 %
Lymphocytes Absolute: 1.1 10*3/uL (ref 0.7–3.1)
Lymphs: 23 %
MCH: 33.2 pg — ABNORMAL HIGH (ref 26.6–33.0)
MCHC: 34 g/dL (ref 31.5–35.7)
MCV: 98 fL — ABNORMAL HIGH (ref 79–97)
Monocytes Absolute: 0.5 10*3/uL (ref 0.1–0.9)
Monocytes: 10 %
Neutrophils Absolute: 3.1 10*3/uL (ref 1.4–7.0)
Neutrophils: 65 %
Platelets: 244 10*3/uL (ref 150–450)
RBC: 3.86 x10E6/uL (ref 3.77–5.28)
RDW: 12 % (ref 11.7–15.4)
WBC: 4.8 10*3/uL (ref 3.4–10.8)

## 2019-11-12 LAB — HIV ANTIBODY (ROUTINE TESTING W REFLEX): HIV Screen 4th Generation wRfx: NONREACTIVE

## 2019-11-12 LAB — TSH: TSH: 1.79 u[IU]/mL (ref 0.450–4.500)

## 2019-11-12 LAB — VITAMIN D 25 HYDROXY (VIT D DEFICIENCY, FRACTURES): Vit D, 25-Hydroxy: 45.5 ng/mL (ref 30.0–100.0)

## 2019-12-02 ENCOUNTER — Encounter: Payer: Self-pay | Admitting: Family Medicine

## 2019-12-08 NOTE — Progress Notes (Signed)
Chief Complaint  Patient presents with  . Follow-up    on blood pressure. Patient has a list of blood pressures with her today.   . Nevus    right hip.    Patient presents to follow-up on her blood pressure. It was noted to be quite high at her physical last month, but normal at prior visits. BP Readings from Last 3 Encounters:  11/11/19 (!) 180/110  03/24/19 126/75  01/05/19 120/80   She has been following a low sodium diet, and monitoring her BP regularly. She bring in her wrist monitor, which was verified as accurate.  She has had wide fluctuations in BP, ranging from 92-142/59-94 Mostly 110-125/70's on average, with many lower, and only a few 140/90 or higher (usually in the morning).  She also has a mole she noted on her R hip a week ago that she is worried about.  She has had a mole at the right hip, but has always been small. It got larger and red on the outside. It was previously smooth, per pt. No itching, bleeding, painful,  At the R lateral waist--likely irritated by clothing, possibly her fingernail in pulling down pants.  PMH, PSH, SH reviewed  Outpatient Encounter Medications as of 12/09/2019  Medication Sig Note  . VITAMIN D PO Take 5,000 Int'l Units by mouth daily. 10/28/2018: Takes 3-4 times/week  . triamcinolone cream (KENALOG) 0.1 % APPLY TO ACTIVE RASH TWICE A DAY AS NEEDED (Patient not taking: Reported on 12/09/2019)    No facility-administered encounter medications on file as of 12/09/2019.   Allergies  Allergen Reactions  . Penicillins Hives and Itching    Has patient had a PCN reaction causing immediate rash, facial/tongue/throat swelling, SOB or lightheadedness with hypotension: Yes Has patient had a PCN reaction causing severe rash involving mucus membranes or skin necrosis: No Has patient had a PCN reaction that required hospitalization No Has patient had a PCN reaction occurring within the last 10 years: No If all of the above answers are "NO", then  may proceed with Cephalosporin use.     ROS: no fever, chills, headaches, dizziness, chest pain. No palpitations. No edema.  No GI complaints, URI symptoms No rashes. Mole per HPI   PHYSICAL EXAM:  BP (!) 140/94   Pulse 76   Ht 5\' 3"  (1.6 m)   Wt 131 lb (59.4 kg)   BMI 23.21 kg/m  BP with pt's monitor 141/90  162/99 when rechecked with MD in the room  Wt Readings from Last 3 Encounters:  12/09/19 131 lb (59.4 kg)  11/11/19 127 lb (57.6 kg)  03/24/19 125 lb (56.7 kg)   Pleasant female, somewhat excitable, but otherwise in good spirits, in no distress. Evaluation of mole at R lateral waist: Measures 40mm x 4.29mm total size, including the outer band of pink/flat discoloration.  Central portion is brown, uniform. Raised portion measures 4.96mm x 3 mm.   ASSESSMENT/PLAN:  White coat syndrome without diagnosis of hypertension - Monitor is accurate, BP's at home are fine--sometimes low, occasionally high/borderline. Rec monitoring prior to MD visits, or once monthly, not more  Change in mole - there appears to be some inflammation/irritation--less red than in photo she sent, now pink, likely healing.  Cont to monitor for change

## 2019-12-09 ENCOUNTER — Ambulatory Visit (INDEPENDENT_AMBULATORY_CARE_PROVIDER_SITE_OTHER): Payer: BC Managed Care – PPO | Admitting: Family Medicine

## 2019-12-09 ENCOUNTER — Encounter: Payer: Self-pay | Admitting: Family Medicine

## 2019-12-09 ENCOUNTER — Other Ambulatory Visit: Payer: Self-pay

## 2019-12-09 VITALS — BP 140/94 | HR 76 | Ht 63.0 in | Wt 131.0 lb

## 2019-12-09 DIAGNOSIS — D229 Melanocytic nevi, unspecified: Secondary | ICD-10-CM

## 2019-12-09 DIAGNOSIS — R030 Elevated blood-pressure reading, without diagnosis of hypertension: Secondary | ICD-10-CM | POA: Diagnosis not present

## 2019-12-10 DIAGNOSIS — R030 Elevated blood-pressure reading, without diagnosis of hypertension: Secondary | ICD-10-CM | POA: Insufficient documentation

## 2019-12-23 DIAGNOSIS — S9031XA Contusion of right foot, initial encounter: Secondary | ICD-10-CM | POA: Diagnosis not present

## 2020-01-10 ENCOUNTER — Encounter: Payer: BC Managed Care – PPO | Admitting: Nurse Practitioner

## 2020-01-28 DIAGNOSIS — M25562 Pain in left knee: Secondary | ICD-10-CM | POA: Diagnosis not present

## 2020-02-06 DIAGNOSIS — Z20822 Contact with and (suspected) exposure to covid-19: Secondary | ICD-10-CM | POA: Diagnosis not present

## 2020-02-07 ENCOUNTER — Telehealth: Payer: Self-pay | Admitting: Family Medicine

## 2020-02-07 NOTE — Telephone Encounter (Signed)
She was exposed by a neighbor on 12/23, she was tested on 12/26 But they do not have results yet  No symptoms, then today her sister tested positive todays and she was with her on Christmas day  She has questions about if the first test comes back negative, she she was exposed again on 12/25, does she need to get tested again.

## 2020-02-07 NOTE — Telephone Encounter (Signed)
Patient advised.

## 2020-02-07 NOTE — Telephone Encounter (Signed)
Hard to say what to do if she doesn't even have the results back from her first test. If it is positive, there is no need for a second test. If she tested on day 3, and didn't have any symptoms, then I WOULD recommend repeat testing.  We generally say to test on day 5 after exposure (can be sooner if a lot of symptoms prior to that). Since exposure to her sister was 12/25, she should test again on 12/30 if no symptoms by then.  She should be quarantining until then.

## 2020-02-08 ENCOUNTER — Encounter: Payer: BC Managed Care – PPO | Admitting: Nurse Practitioner

## 2020-02-11 ENCOUNTER — Encounter: Payer: Self-pay | Admitting: Family Medicine

## 2020-02-17 DIAGNOSIS — Z20822 Contact with and (suspected) exposure to covid-19: Secondary | ICD-10-CM | POA: Diagnosis not present

## 2020-02-21 ENCOUNTER — Other Ambulatory Visit: Payer: Self-pay

## 2020-02-21 ENCOUNTER — Ambulatory Visit (INDEPENDENT_AMBULATORY_CARE_PROVIDER_SITE_OTHER): Payer: BC Managed Care – PPO | Admitting: Nurse Practitioner

## 2020-02-21 ENCOUNTER — Encounter: Payer: Self-pay | Admitting: Nurse Practitioner

## 2020-02-21 VITALS — BP 122/78 | Ht 63.0 in | Wt 129.0 lb

## 2020-02-21 DIAGNOSIS — Z01419 Encounter for gynecological examination (general) (routine) without abnormal findings: Secondary | ICD-10-CM

## 2020-02-21 NOTE — Patient Instructions (Signed)

## 2020-02-21 NOTE — Addendum Note (Signed)
Addended by: Lorine Bears on: 02/21/2020 02:45 PM   Modules accepted: Orders

## 2020-02-21 NOTE — Progress Notes (Signed)
   Deborah Meadows March 04, 1965 [id number]   History:  55 y.o. [id number] presents for annual exam without GYN complaints. Postmenopausal - no HRT, no bleeding. Normal pap and mammogram history.  PCP manages hyperlipidemia.  Gynecologic History Contraception/Family planning: post menopausal status  Health Maintenance Last Pap: 12/2018. Results were: ASCUS negative HPV Last mammogram: 04/07/2019. Results were: normal Last colonoscopy: 02/2019. Results were: normal, 5 year recall Last Dexa: N/A  Past medical history, past surgical history, family history and social history were all reviewed and documented in the EPIC chart.  ROS:  A ROS was performed and pertinent positives and negatives are included.  Exam:  Vitals:   02/21/20 1337  BP: 122/78  Weight: 129 lb (58.5 kg)  Height: 5\' 3"  (1.6 m)   Body mass index is 22.85 kg/m.  General appearance:  Normal Thyroid:  Symmetrical, normal in size, without palpable masses or nodularity. Respiratory  Auscultation:  Clear without wheezing or rhonchi Cardiovascular  Auscultation:  Regular rate, without rubs, murmurs or gallops  Edema/varicosities:  Not grossly evident Abdominal  Soft,nontender, without masses, guarding or rebound.  Liver/spleen:  No organomegaly noted  Hernia:  None appreciated  Skin  Inspection:  Grossly normal   Breasts: Examined lying and sitting.   Right: Without masses, retractions, discharge or axillary adenopathy.   Left: Without masses, retractions, discharge or axillary adenopathy. Gentitourinary   Inguinal/mons:  Normal without inguinal adenopathy  External genitalia:  Normal  BUS/Urethra/Skene's glands:  Normal  Vagina:  Normal  Cervix:  Normal  Uterus:  Normal in size, shape and contour.  Midline and mobile  Adnexa/parametria:     Rt: Without masses or tenderness.   Lt: Without masses or tenderness.  Anus and perineum: Normal  Digital rectal exam: Normal sphincter tone without palpated masses or  tenderness  Assessment/Plan:  55 y.o. [id number] for annual exam.   Well female exam with routine gynecological exam - Education provided on SBEs, importance of preventative screenings, current guidelines, high calcium diet, regular exercise, and multivitamin daily. Labs with PCP.   Screening for cervical cancer - Normal Pap history.  Pap with reflex today per request as she prefers annual screenings.  Screening for breast cancer - Normal mammogram history.  Continue annual screenings.  Normal breast exam today.  Screening for colon cancer -2021 colonoscopy.  Will repeat at GI's recommended interval.   Screening for osteoporosis -we will schedule bone density further into menopause.  Follow-up in 1 year for annual.    [place], 1:57 PM 02/21/2020

## 2020-02-23 LAB — PAP, TP IMAGING W/ HPV RNA, RFLX HPV TYPE 16,18/45: HPV DNA High Risk: NOT DETECTED

## 2020-03-13 ENCOUNTER — Other Ambulatory Visit: Payer: Self-pay | Admitting: Family Medicine

## 2020-03-13 DIAGNOSIS — Z1231 Encounter for screening mammogram for malignant neoplasm of breast: Secondary | ICD-10-CM

## 2020-03-21 DIAGNOSIS — L820 Inflamed seborrheic keratosis: Secondary | ICD-10-CM | POA: Diagnosis not present

## 2020-03-21 DIAGNOSIS — D1801 Hemangioma of skin and subcutaneous tissue: Secondary | ICD-10-CM | POA: Diagnosis not present

## 2020-03-21 DIAGNOSIS — D225 Melanocytic nevi of trunk: Secondary | ICD-10-CM | POA: Diagnosis not present

## 2020-04-03 ENCOUNTER — Encounter: Payer: Self-pay | Admitting: *Deleted

## 2020-04-03 ENCOUNTER — Encounter: Payer: Self-pay | Admitting: Family Medicine

## 2020-04-03 ENCOUNTER — Ambulatory Visit: Payer: BC Managed Care – PPO | Admitting: Family Medicine

## 2020-04-03 ENCOUNTER — Other Ambulatory Visit: Payer: Self-pay

## 2020-04-03 VITALS — BP 116/82 | HR 84 | Ht 63.0 in

## 2020-04-03 DIAGNOSIS — R6889 Other general symptoms and signs: Secondary | ICD-10-CM

## 2020-04-03 DIAGNOSIS — R030 Elevated blood-pressure reading, without diagnosis of hypertension: Secondary | ICD-10-CM | POA: Diagnosis not present

## 2020-04-03 DIAGNOSIS — E041 Nontoxic single thyroid nodule: Secondary | ICD-10-CM

## 2020-04-03 DIAGNOSIS — Z1152 Encounter for screening for COVID-19: Secondary | ICD-10-CM | POA: Diagnosis not present

## 2020-04-03 DIAGNOSIS — R0989 Other specified symptoms and signs involving the circulatory and respiratory systems: Secondary | ICD-10-CM

## 2020-04-03 DIAGNOSIS — E7800 Pure hypercholesterolemia, unspecified: Secondary | ICD-10-CM

## 2020-04-03 NOTE — Progress Notes (Signed)
Chief Complaint  Patient presents with  . Cyst    Has knot in throat, would like follow up ultrasound-if possible. Patient needs PCR test done.    Patient is concerned because for the last 2 weeks she is having to clear her throat more, and it feels different when she swallows. She has a known cyst related to the thyroid, causing a visible lump in her anterior neck.  She denies any increase in size, tenderness or changes, but worries that perhaps is could be growing or pushing posteriorly. Wants re-evaluation of the lump. She reports having some chronic congestion/allergies, doesn't take anything for it.  Review of [place] from 04/2016: IMPRESSION: 1. The palpable abnormality corresponds with a 1 cm benign cyst arising exophytic from the left aspect the thyroid isthmus. While this lesion requires no dedicated imaging follow-up, or biopsy, aspiration could be performed if clinically warranted for symptomatic or cosmetic relief. 2. Additional scattered bilateral tiny thyroid nodules which do not meet criteria for either imaging follow-up or biopsy.  She has h/o elevated BP's in the office.  She has been monitoring them at home, and over the last week BP's 119-123/82, max of 133/89.  She has been getting regular execise with [employer] app (30 mins daily, plus walking) Limits salt, pizza Her cholesterol was higher on last check (has great HDL, but LDL had increased from 130's to over 160).  She has been very careful with her diet, cutting back from 1 egg 5x/week, to 2 yolks/week (having more egg whites, fewer yolks). She also cut out pizza (had been having it 3-4x/week). She is interested in returning for another lipid panel. She reports she would be willing to take statin if lipids not improved.  Prior lipids reviewed--she has always had excellent HDL, LDL increased on last check (had been LDL 137 in 10/2018, when HDL 118). Lab Results  Component Value Date   CHOL 280 (H) 11/11/2019   HDL 108  11/11/2019   LDLCALC 163 (H) 11/11/2019   TRIG 59 11/11/2019   CHOLHDL 2.6 11/11/2019   She is going to [facility] later this week, and is requesting COVID test prior to traveling. She denies any symptoms, no known exposures.     PMH, PSH, SH reviewed  Outpatient Encounter Medications as of 04/03/2020  Medication Sig Note  . Cholecalciferol (VITAMIN D) 50 MCG (2000 UT) tablet Take 4,000 Units by mouth daily. 04/03/2020: 3 times a week  . vitamin C (ASCORBIC ACID) 500 MG tablet Take 500 mg by mouth daily.   . [DISCONTINUED] VITAMIN D PO Take 5,000 Int'l Units by mouth daily. 10/28/2018: Takes 3-4 times/week  . triamcinolone cream (KENALOG) 0.1 % APPLY TO ACTIVE RASH TWICE A DAY AS NEEDED (Patient not taking: Reported on 04/03/2020)    No facility-administered encounter medications on file as of 04/03/2020.   Allergies  Allergen Reactions  . Penicillins Hives and Itching    Has patient had a PCN reaction causing immediate rash, facial/tongue/throat swelling, SOB or lightheadedness with hypotension: Yes Has patient had a PCN reaction causing severe rash involving mucus membranes or skin necrosis: No Has patient had a PCN reaction that required hospitalization No Has patient had a PCN reaction occurring within the last 10 years: No If all of the above answers are "NO", then may proceed with Cephalosporin use.    ROS: no headaches, dizziness, URI symptoms.  Some chronic congestion, change in swallowing per HPI.  No cough, shortness of breath, chest pain, palpitations. No GI complaints. No  change in hair/skin/energy/bowels or moods.   PHYSICAL EXAM:  BP 116/82   Pulse 84   Ht 5\' 3"  (1.6 m)   BMI 22.85 kg/m   Pleasant female, somewhat excitable, constantly moving around room. She is in no distress HEENT: conjunctiva and sclera are clear, EOMI.  Nasal mucosa is normal, no edema or mucus.  Sinuses are nontender, OP is clear Neck: no lymphadenopathy, no thyromegaly.  There is a soft tissue,  mobile swelling in the anterior neck, midline (or slightly to left), unchanged in size Heart: regular rate and rhythm, no murmur Lungs: clear bilaterally Extremities: no edema Psych: normal mood, affect, hygiene and grooming Neuro: alert and oriented, normal strength, gait   ASSESSMENT/PLAN:  Cyst of thyroid determined by ultrasound - Feels unchanged; recheck [place].  Suspect her sx are more related to either PND or GERD, rather than thyroid - Plan: US THYROID  Encounter for screening for COVID-19 - requested test due to travel to [facility] later this week. Asymptomatic - Plan: Novel Coronavirus, NAA ([facility])  Hypercholesterolemia - return for recheck. Reviewed goals--Ideally <130, but under 160 fine, given her excellent HDL and no risk factors. BP's are good - Plan: Lipid panel  White coat syndrome without diagnosis of hypertension - normal BP at home today; continue monitoring at home prior to visits here  Throat clearing - Ddx reviewed, including PND vs GERD. Doubt related to thyroid gland, but given h/o prior cyst and nodules, recheck [place]. Mucinex/claritin and/or Prilosec  I spent 35 minutes dedicated to the care of this patient, including pre-visit review of records, face to face time, post-visit ordering of testing and documentation.   Drink plenty of water. Try taking Claritin (or zyrtec or allegra) along with Mucinex to see if this helps with your symptoms. We will set you up for a follow-up ultrasound to look for any significant changes in the thyroid cysts or nodules.

## 2020-04-03 NOTE — Patient Instructions (Addendum)
We are going to schedule you for an ultrasound of the thyroid.  Continue your healthy diet, exercise. Your blood pressures are wonderful.  Drink plenty of water. Try taking Claritin (or zyrtec or allegra) along with Mucinex to see if this helps with your symptoms. If this doesn't help with your throat symptoms, you can try prilosec.

## 2020-04-04 LAB — NOVEL CORONAVIRUS, NAA: SARS-CoV-2, NAA: NOT DETECTED

## 2020-04-04 LAB — SARS-COV-2, NAA 2 DAY TAT

## 2020-04-05 ENCOUNTER — Other Ambulatory Visit: Payer: Self-pay

## 2020-04-05 ENCOUNTER — Other Ambulatory Visit: Payer: BC Managed Care – PPO

## 2020-04-05 DIAGNOSIS — E7800 Pure hypercholesterolemia, unspecified: Secondary | ICD-10-CM

## 2020-04-06 LAB — LIPID PANEL
Chol/HDL Ratio: 2.1 ratio (ref 0.0–4.4)
Cholesterol, Total: 272 mg/dL — ABNORMAL HIGH (ref 100–199)
HDL: 127 mg/dL (ref >39)
LDL Chol Calc (NIH): 136 mg/dL — ABNORMAL HIGH (ref 0–99)
Triglycerides: 61 mg/dL (ref 0–149)
VLDL Cholesterol Cal: 9 mg/dL (ref 5–40)

## 2020-04-11 ENCOUNTER — Encounter: Payer: Self-pay | Admitting: Family Medicine

## 2020-04-11 ENCOUNTER — Ambulatory Visit
Admission: RE | Admit: 2020-04-11 | Discharge: 2020-04-11 | Disposition: A | Discharge status: Home / Self Care | Payer: BC Managed Care – PPO | Source: Ambulatory Visit | Attending: Family Medicine | Admitting: Family Medicine

## 2020-04-11 DIAGNOSIS — E041 Nontoxic single thyroid nodule: Secondary | ICD-10-CM

## 2020-04-12 ENCOUNTER — Other Ambulatory Visit: Payer: BC Managed Care – PPO

## 2020-04-12 DIAGNOSIS — Z20822 Contact with and (suspected) exposure to covid-19: Secondary | ICD-10-CM | POA: Diagnosis not present

## 2020-04-13 LAB — SPECIMEN STATUS REPORT

## 2020-04-13 LAB — NOVEL CORONAVIRUS, NAA: SARS-CoV-2, NAA: NOT DETECTED

## 2020-04-13 LAB — SARS-COV-2, NAA 2 DAY TAT

## 2020-05-08 ENCOUNTER — Ambulatory Visit
Admission: RE | Admit: 2020-05-08 | Discharge: 2020-05-08 | Disposition: A | Discharge status: Home / Self Care | Payer: BC Managed Care – PPO | Source: Ambulatory Visit | Attending: Family Medicine | Admitting: Family Medicine

## 2020-05-08 ENCOUNTER — Other Ambulatory Visit: Payer: Self-pay

## 2020-05-08 DIAGNOSIS — Z1231 Encounter for screening mammogram for malignant neoplasm of breast: Secondary | ICD-10-CM

## 2020-05-23 DIAGNOSIS — D225 Melanocytic nevi of trunk: Secondary | ICD-10-CM | POA: Diagnosis not present

## 2020-05-23 DIAGNOSIS — D1801 Hemangioma of skin and subcutaneous tissue: Secondary | ICD-10-CM | POA: Diagnosis not present

## 2020-05-23 DIAGNOSIS — D2262 Melanocytic nevi of left upper limb, including shoulder: Secondary | ICD-10-CM | POA: Diagnosis not present

## 2020-05-23 DIAGNOSIS — D692 Other nonthrombocytopenic purpura: Secondary | ICD-10-CM | POA: Diagnosis not present

## 2020-08-21 DIAGNOSIS — H04123 Dry eye syndrome of bilateral lacrimal glands: Secondary | ICD-10-CM | POA: Diagnosis not present

## 2020-08-21 DIAGNOSIS — H524 Presbyopia: Secondary | ICD-10-CM | POA: Diagnosis not present

## 2020-08-21 DIAGNOSIS — H5213 Myopia, bilateral: Secondary | ICD-10-CM | POA: Diagnosis not present

## 2020-09-05 DIAGNOSIS — M25562 Pain in left knee: Secondary | ICD-10-CM | POA: Diagnosis not present

## 2020-09-05 DIAGNOSIS — M25561 Pain in right knee: Secondary | ICD-10-CM | POA: Diagnosis not present

## 2020-09-11 ENCOUNTER — Other Ambulatory Visit: Payer: Self-pay | Admitting: Sports Medicine

## 2020-09-11 DIAGNOSIS — M25561 Pain in right knee: Secondary | ICD-10-CM

## 2020-09-17 ENCOUNTER — Other Ambulatory Visit: Payer: BC Managed Care – PPO

## 2020-09-28 ENCOUNTER — Ambulatory Visit
Admission: RE | Admit: 2020-09-28 | Discharge: 2020-09-28 | Disposition: A | Discharge status: Home / Self Care | Payer: BC Managed Care – PPO | Source: Ambulatory Visit | Attending: Sports Medicine | Admitting: Sports Medicine

## 2020-09-28 DIAGNOSIS — S86812A Strain of other muscle(s) and tendon(s) at lower leg level, left leg, initial encounter: Secondary | ICD-10-CM | POA: Diagnosis not present

## 2020-09-28 DIAGNOSIS — M25561 Pain in right knee: Secondary | ICD-10-CM | POA: Diagnosis not present

## 2020-09-28 DIAGNOSIS — R600 Localized edema: Secondary | ICD-10-CM | POA: Diagnosis not present

## 2020-09-28 DIAGNOSIS — M25562 Pain in left knee: Secondary | ICD-10-CM | POA: Diagnosis not present

## 2020-10-30 DIAGNOSIS — M67863 Other specified disorders of tendon, right knee: Secondary | ICD-10-CM | POA: Diagnosis not present

## 2020-10-30 DIAGNOSIS — M25562 Pain in left knee: Secondary | ICD-10-CM | POA: Diagnosis not present

## 2020-10-30 DIAGNOSIS — M25561 Pain in right knee: Secondary | ICD-10-CM | POA: Diagnosis not present

## 2020-11-09 DIAGNOSIS — M25562 Pain in left knee: Secondary | ICD-10-CM | POA: Diagnosis not present

## 2020-11-09 DIAGNOSIS — M25561 Pain in right knee: Secondary | ICD-10-CM | POA: Diagnosis not present

## 2020-11-12 NOTE — Patient Instructions (Addendum)
  HEALTH MAINTENANCE RECOMMENDATIONS:  It is recommended that you get at least 30 minutes of aerobic exercise at least 5 days/week (for weight loss, you may need as much as 60-90 minutes). This can be any activity that gets your heart rate up. This can be divided in 10-15 minute intervals if needed, but try and build up your endurance at least once a week.  Weight bearing exercise is also recommended twice weekly.  Eat a healthy diet with lots of vegetables, fruits and fiber.  "Colorful" foods have a lot of vitamins (ie green vegetables, tomatoes, red peppers, etc).  Limit sweet tea, regular sodas and alcoholic beverages, all of which has a lot of calories and sugar.  Up to 1 alcoholic drink daily may be beneficial for women (unless trying to lose weight, watch sugars).  Drink a lot of water.  Calcium recommendations are 1200-1500 mg daily (1500 mg for postmenopausal women or women without ovaries), and vitamin D 1000 IU daily.  This should be obtained from diet and/or supplements (vitamins), and calcium should not be taken all at once, but in divided doses.  Monthly self breast exams and yearly mammograms for women over the age of 38 is recommended.  Sunscreen of at least SPF 30 should be used on all sun-exposed parts of the skin when outside between the hours of 10 am and 4 pm (not just when at beach or pool, but even with exercise, golf, tennis, and yard work!)  Use a sunscreen that says "broad spectrum" so it covers both UVA and UVB rays, and make sure to reapply every 1-2 hours.  Remember to change the batteries in your smoke detectors when changing your clock times in the spring and fall. Carbon monoxide detectors are recommended for your home.  Use your seat belt every time you are in a car, and please drive safely and not be distracted with cell phones and texting while driving.  I recommend getting the new shingles vaccine (Shingrix). You may want to check with your insurance to verify what  your out of pocket cost may be (usually covered as preventative, but better to verify to avoid any surprises, as this vaccine is expensive), and then schedule a nurse visit at our office when convenient (based on the possible side effects as discussed).   This is a series of 2 injections, spaced 2 months apart.  It doesn't have to be exactly 2 months apart (but can't be sooner), if that isn't feasible for your schedule, but try and get them close to 2 months (and definitely within 6 months of each other, or else the efficacy of the vaccine drops off). This should be separated from other vaccines by at least 2 weeks.   I believe Dr. [name] Lank is an endocrinologist through [facility] is another option (Dr. Kelton Pillar is taking new patients, not Dr. Cruzita Lederer). Let us know if/when you'd like a referral.

## 2020-11-12 NOTE — Progress Notes (Signed)
Chief Complaint  Patient presents with   Annual Exam    Nonfasting annual exam, no pap. Would like fasting labs but will schedule NV for this week. Had bivalent covid booster in Sept at [facility], I abstracted. Will check insurance [id number] vaccine.     Deborah Meadows is a 55 y.o. female who presents for a complete physical.  She has the following concerns:  She went to [facility] for ongoing posterior knee pain. MRI L knee 09/2020 (ordered by Dr. Alfonso Ramus): IMPRESSION: Left knee: 1. 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 Suspect a component of partial tearing at the myotendinous junction proximally. 2. Small leaking Baker's cyst. No internal derangement of the left Knee  IMPRESSION: Right knee: 1. Mild intrasubstance degeneration of the medial meniscus with possible subtle undersurface tear at the posterior horn-body junction. 2. Mild free edge fraying along the mid body of the lateral meniscus. 3. Moderate tendinosis of the origin of the medial head of the gastrocnemius with peritendinous edema. 4. Mild intramuscular edema within the visualized calf musculature which may reflect muscle strain. 5. Intact cruciate and collateral ligaments.  She later saw Dr. Theda Sers for follow-up and discussion of these studies, who explained the results.  She went to PT, was given home exercises.  Then recommended seeing Dr. Delilah Shan, hasn't scheduled with him yet. Standing all day (at work) is what causes her the most discomfort, discomfort behind her knees and into her posterior thighs. She is looking to get compression stockings, hasn't gotten yet.   February 2018 she presented with a neck mass, found to be a benign cyst per ultrasound (1cm arising exophytic from L aspect of thyroid isthmus; also had scattered bilateral tiny thyroid nodules). She denies any changes, pain.  Currently denies any difficulty swallowing or symptoms  related to the lump.  She last had this evaluated with ultrasound in 04/2020: IMPRESSION: 1. 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 This does not meet criteria for fine-needle aspiration or follow-up. However, a symptomatic cyst could be aspirated, if needed, with the caveat that fluid may re-accumulate within the cyst. 2. 0.8 cm right superior cyst demonstrates decrease in size since the prior study and is benign.  She admits to being a worrier, and is asking about seeing a specialist at some point for her thyroid (but willing to wait until she is due for another ultrasound).  Her fears relate to her sister having thyroid cancer (and feeling like the diagnosis was missed by her initial endocrinologist).  Sister is under the care of someone through [facility] (initially saw Dr. Chalmers Cater).    She has h/o elevated BP's in the office.  BP's at home are much lower, and her monitor was verified as accurate in 11/2019; she brought it again, still accurate. BP's at home are 108-123/71-88.  She continues to get regular execise with peleton app (30 mins daily on average, strength plus cardio). Walking is much slower than in the past since having her knee issues. Limits salt in her diet.  Hypercholesterolemia--she has had excellent HDL.  Her LDL cholesterol was higher at her physical last year (LDL went up to 163, had been in the 130's).  Repeat was improved in 03/2020.  At that time she reported being very careful with her diet, cutting back from 1 egg 5x/week, to 2 yolks/week (having more egg whites, fewer yolks). She also  cut back on pizza (has only every 5-6 weeks, 1.5 pieces). She is not fasting today, will return for recheck, wants it checked again. Lab Results  Component Value Date   CHOL 272 (H) 04/05/2020   HDL 127 04/05/2020   LDLCALC 136 (H) 04/05/2020   TRIG 61 04/05/2020   CHOLHDL 2.1 04/05/2020     Vitamin D deficiency was noted in 2017, with a level of 26.  Level was 52.7 last year, when taking 5000 IU 3-4 times/week.  Ran out of 5000 IU, is currently taking 4000 IU 3x/week, and wants this rechecked.  Immunization History  Administered Date(s) Administered   Influenza Split 11/01/2010   Influenza,inj,Quad PF,6+ Mos 11/02/2012, 10/05/2015, 10/09/2016, 10/15/2017, 10/28/2018, 11/11/2019, 11/13/2020   PFIZER(Purple Top)SARS-COV-2 Vaccination 05/26/2019, 06/16/2019   Pfizer Covid-19 Vaccine Bivalent Booster 75yrs & up 10/19/2020   Tdap 11/02/2012   Last Pap smear: 02/2020, normal, negative high risk HPV (through GYN; prior pap had been in 12/2018 ASCUS, no high risk HPV) Last mammogram: 04/2020 Last colonoscopy: 03/2019--diverticulosis, small internal hemorrhoids.  No polyps.  Repeat 5 years (had 85mm tubular adenoma in 02/2016). Last DEXA: never  Dentist:  twice yearly Ophtho: yearly Exercise:  [employer] app averaging 30 mins day, 7 days/week.   Unable to power walk due to her knees, but "strolls" with her dogs every night. Weight-bearing exercise is through the [place], daily. 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   PMH, PSH, SH and FH were reviewed and updated  Outpatient Encounter Medications as of 11/13/2020  Medication Sig Note   Cholecalciferol (VITAMIN D) 50 MCG (2000 UT) tablet Take 4,000 Units by mouth daily. 11/13/2020: Taking 4000 IU 3x/week   vitamin C (ASCORBIC ACID) 500 MG tablet Take 500 mg by mouth daily.    triamcinolone cream (KENALOG) 0.1 % APPLY TO ACTIVE RASH TWICE A DAY AS NEEDED (Patient not taking: No sig reported)    No facility-administered encounter medications on file as of 11/13/2020.   Allergies  Allergen Reactions   Penicillins Hives and Itching    Has patient had a PCN reaction causing immediate rash, facial/tongue/throat swelling, SOB or lightheadedness with hypotension: Yes Has patient had a PCN reaction causing severe  rash involving mucus membranes or skin necrosis: No Has patient had a PCN reaction that required hospitalization No Has patient had a PCN reaction occurring within the last 10 years: No If all of the above answers are "NO", then may proceed with Cephalosporin use.     ROS:  The patient denies anorexia, fever, weight changes, headaches, vision changes, decreased hearing, ear pain, sore throat, breast concerns, dizziness, syncope, dyspnea on exertion, cough, swelling, nausea, vomiting, diarrhea, constipation, abdominal pain, melena, hematochezia, indigestion/heartburn, hematuria, incontinence, dysuria, vaginal bleeding, discharge, odor or itch, genital lesions, joint pains, numbness, tingling, weakness, tremor, suspicious skin lesions, depression, anxiety, abnormal bleeding/bruising, or enlarged lymph nodes. Postmenopausal.  Only mild hot flashes. No sweats. Some chronic postnasal drainage. Eczema--occasionally flares behind her ears, and on neck, uses steroid cream prn. Saw derm for a full skin check this year. Bilateral pain behind the knees (from the calfs, and into the upper thighs) per HPI Rare palpitations.    PHYSICAL EXAM:  BP 130/76   Pulse 84   Ht 5\' 3"  (1.6 m)   Wt 132 lb 12.8 oz (60.2 kg)   BMI 23.52 kg/m   Wt Readings from Last 3 Encounters:  11/13/20 132 lb 12.8 oz (60.2 kg)  02/21/20 129 lb (58.5  kg)  12/09/19 131 lb (59.4 kg)   General Appearance:    Alert, cooperative, no distress, appears stated age. She declined getting changed into gown today (sees GYN, derm)  Head:    Normocephalic, without obvious abnormality, atraumatic  Eyes:   PERRL, conjunctiva/corneas clear, EOM's intact, fundi benign  Ears:    Normal TM's and external ear canals.  Nose:   Not examined, wearing mask due to COVID-19 pandemic  Throat:   Not examined, wearing mask due to COVID-19 pandemic  Neck:  Supple, no lymphadenopathy; thyroid: nontender. There is a 1-1.5cm soft, superficial, mobile  mass just to the left of midline over thyroid, unchanged from prior visit. No carotid bruit or JVD  Back:    Spine nontender, no curvature, ROM normal, no CVA  tenderness  Lungs:   Clear to auscultation bilaterally without wheezes, rales or ronchi;  respirations unlabored  Chest Wall:    No tenderness or deformity   Heart:    Regular rate and rhythm, S1 and S2 normal, no murmur, rub or gallop. Only 1 ectopic beat noted.  Breast Exam:    Deferred to GYN  Abdomen:     Soft, non-tender, nondistended, normoactive bowel sounds, no masses, no hepatosplenomegaly  Genitalia:    Deferred to GYN       Extremities:   No clubbing, cyanosis or edema.  Pulses:   2+ and symmetric all extremities  Skin:   Skin color, texture, turgor normal. Exam limited (not changed into gown, wearing mask)  Lymph nodes:   Cervical, supraclavicular nodes normal  Neurologic:   Normal strength, sensation and gait; reflexes 2+ and symmetric throughout                 Psych:   Normal mood, affect, hygiene and grooming   ASSESSMENT/PLAN:  Annual physical exam - Plan: VITAMIN D 25 Hydroxy (Vit-D Deficiency, Fractures), Lipid panel, CBC with Differential/Platelet, TSH, Comprehensive metabolic panel, POCT Urinalysis DIP (Proadvantage Device)  Hypercholesterolemia - improved with dietary changes. Continue low cholesterol diet.  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  Discussed endos, can refer if/when desired for reassurance, if needed - Plan: TSH  White coat syndrome without diagnosis of hypertension - BP much better today, and continues to be normal at home, reassured  Need for influenza vaccination - Plan: Flu Vaccine QUAD 6+ mos PF IM (Fluarix Quad PF)  Bilateral posterior LE/knee pain.  Reviewed MRI results.  Agree with rec to see sports med for further treatment.  Pt will call to schedule.  Pt will return for fasting labs.  Discussed monthly  self breast exams and yearly mammograms; at least 30 minutes of aerobic activity at least 5 days/week, weight-bearing exercise at least 2x/week; proper sunscreen use reviewed; healthy diet, including goals of calcium and vitamin D intake and alcohol recommendations (less than or equal to 1 drink/day) reviewed; regular seatbelt use; changing batteries in smoke detectors, carbon monoxide detectors.  Immunization recommendations discussed--flu shot today. Shingrix discussed, recommended. To check insurance and return for NV when desired.  Colonoscopy recommendations reviewed, UTD (due again 03/2024)  F/u 1 year, sooner prn.

## 2020-11-13 ENCOUNTER — Ambulatory Visit (INDEPENDENT_AMBULATORY_CARE_PROVIDER_SITE_OTHER): Payer: BC Managed Care – PPO | Admitting: Family Medicine

## 2020-11-13 ENCOUNTER — Encounter: Payer: Self-pay | Admitting: Family Medicine

## 2020-11-13 ENCOUNTER — Other Ambulatory Visit: Payer: Self-pay

## 2020-11-13 ENCOUNTER — Encounter: Payer: Self-pay | Admitting: Nurse Practitioner

## 2020-11-13 VITALS — BP 130/76 | HR 84 | Ht 63.0 in | Wt 132.8 lb

## 2020-11-13 DIAGNOSIS — Z0000 Encounter for general adult medical examination without abnormal findings: Secondary | ICD-10-CM | POA: Diagnosis not present

## 2020-11-13 DIAGNOSIS — R030 Elevated blood-pressure reading, without diagnosis of hypertension: Secondary | ICD-10-CM | POA: Diagnosis not present

## 2020-11-13 DIAGNOSIS — Z23 Encounter for immunization: Secondary | ICD-10-CM | POA: Diagnosis not present

## 2020-11-13 DIAGNOSIS — E041 Nontoxic single thyroid nodule: Secondary | ICD-10-CM | POA: Diagnosis not present

## 2020-11-13 DIAGNOSIS — E7800 Pure hypercholesterolemia, unspecified: Secondary | ICD-10-CM | POA: Diagnosis not present

## 2020-11-13 LAB — POCT URINALYSIS DIP (PROADVANTAGE DEVICE)
Bilirubin, UA: NEGATIVE
Blood, UA: NEGATIVE
Glucose, UA: NEGATIVE mg/dL
Ketones, POC UA: NEGATIVE mg/dL
Leukocytes, UA: NEGATIVE
Nitrite, UA: NEGATIVE
Protein Ur, POC: NEGATIVE mg/dL
Specific Gravity, Urine: 1.005
Urobilinogen, Ur: NEGATIVE
pH, UA: 6 (ref 5.0–8.0)

## 2020-11-14 ENCOUNTER — Other Ambulatory Visit: Payer: BC Managed Care – PPO

## 2020-11-14 DIAGNOSIS — E7800 Pure hypercholesterolemia, unspecified: Secondary | ICD-10-CM

## 2020-11-14 DIAGNOSIS — E559 Vitamin D deficiency, unspecified: Secondary | ICD-10-CM | POA: Diagnosis not present

## 2020-11-14 DIAGNOSIS — E041 Nontoxic single thyroid nodule: Secondary | ICD-10-CM

## 2020-11-14 DIAGNOSIS — Z0000 Encounter for general adult medical examination without abnormal findings: Secondary | ICD-10-CM

## 2020-11-15 ENCOUNTER — Encounter: Payer: Self-pay | Admitting: Family Medicine

## 2020-11-15 LAB — CBC WITH DIFFERENTIAL/PLATELET
Basophils Absolute: 0.1 10*3/uL (ref 0.0–0.2)
Basos: 1 %
EOS (ABSOLUTE): 0.1 10*3/uL (ref 0.0–0.4)
Eos: 1 %
Hematocrit: 37.2 % (ref 34.0–46.6)
Hemoglobin: 12.7 g/dL (ref 11.1–15.9)
Immature Grans (Abs): 0 10*3/uL (ref 0.0–0.1)
Immature Granulocytes: 0 %
Lymphocytes Absolute: 1.2 10*3/uL (ref 0.7–3.1)
Lymphs: 32 %
MCH: 33.4 pg — ABNORMAL HIGH (ref 26.6–33.0)
MCHC: 34.1 g/dL (ref 31.5–35.7)
MCV: 98 fL — ABNORMAL HIGH (ref 79–97)
Monocytes Absolute: 0.5 10*3/uL (ref 0.1–0.9)
Monocytes: 15 %
Neutrophils Absolute: 1.9 10*3/uL (ref 1.4–7.0)
Neutrophils: 51 %
Platelets: 223 10*3/uL (ref 150–450)
RBC: 3.8 x10E6/uL (ref 3.77–5.28)
RDW: 11.8 % (ref 11.7–15.4)
WBC: 3.7 10*3/uL (ref 3.4–10.8)

## 2020-11-15 LAB — COMPREHENSIVE METABOLIC PANEL
ALT: 23 IU/L (ref 0–32)
AST: 45 IU/L — ABNORMAL HIGH (ref 0–40)
Albumin/Globulin Ratio: 2 (ref 1.2–2.2)
Albumin: 4.5 g/dL (ref 3.8–4.9)
Alkaline Phosphatase: 68 IU/L (ref 44–121)
BUN/Creatinine Ratio: 13 (ref 9–23)
BUN: 8 mg/dL (ref 6–24)
Bilirubin Total: 0.3 mg/dL (ref 0.0–1.2)
CO2: 23 mmol/L (ref 20–29)
Calcium: 9.4 mg/dL (ref 8.7–10.2)
Chloride: 97 mmol/L (ref 96–106)
Creatinine, Ser: 0.63 mg/dL (ref 0.57–1.00)
Globulin, Total: 2.3 g/dL (ref 1.5–4.5)
Glucose: 81 mg/dL (ref 70–99)
Potassium: 4.4 mmol/L (ref 3.5–5.2)
Sodium: 137 mmol/L (ref 134–144)
Total Protein: 6.8 g/dL (ref 6.0–8.5)
eGFR: 104 mL/min/{1.73_m2} (ref >59)

## 2020-11-15 LAB — LIPID PANEL
Chol/HDL Ratio: 2.7 ratio (ref 0.0–4.4)
Cholesterol, Total: 255 mg/dL — ABNORMAL HIGH (ref 100–199)
HDL: 96 mg/dL (ref >39)
LDL Chol Calc (NIH): 140 mg/dL — ABNORMAL HIGH (ref 0–99)
Triglycerides: 111 mg/dL (ref 0–149)
VLDL Cholesterol Cal: 19 mg/dL (ref 5–40)

## 2020-11-15 LAB — TSH: TSH: 2.65 u[IU]/mL (ref 0.450–4.500)

## 2020-11-15 LAB — VITAMIN D 25 HYDROXY (VIT D DEFICIENCY, FRACTURES): Vit D, 25-Hydroxy: 63.6 ng/mL (ref 30.0–100.0)

## 2020-12-01 DIAGNOSIS — M25562 Pain in left knee: Secondary | ICD-10-CM | POA: Diagnosis not present

## 2020-12-01 DIAGNOSIS — M25561 Pain in right knee: Secondary | ICD-10-CM | POA: Diagnosis not present

## 2021-02-21 ENCOUNTER — Ambulatory Visit (INDEPENDENT_AMBULATORY_CARE_PROVIDER_SITE_OTHER): Payer: BC Managed Care – PPO | Admitting: Nurse Practitioner

## 2021-02-21 ENCOUNTER — Encounter: Payer: Self-pay | Admitting: Nurse Practitioner

## 2021-02-21 ENCOUNTER — Other Ambulatory Visit: Payer: Self-pay

## 2021-02-21 ENCOUNTER — Other Ambulatory Visit (HOSPITAL_COMMUNITY)
Admission: RE | Admit: 2021-02-21 | Discharge: 2021-02-21 | Disposition: A | Discharge status: Home / Self Care | Payer: BC Managed Care – PPO | Source: Ambulatory Visit | Attending: Nurse Practitioner | Admitting: Nurse Practitioner

## 2021-02-21 VITALS — BP 134/80 | Ht 62.5 in | Wt 127.0 lb

## 2021-02-21 DIAGNOSIS — R102 Pelvic and perineal pain: Secondary | ICD-10-CM | POA: Diagnosis not present

## 2021-02-21 DIAGNOSIS — Z780 Asymptomatic menopausal state: Secondary | ICD-10-CM

## 2021-02-21 DIAGNOSIS — Z01419 Encounter for gynecological examination (general) (routine) without abnormal findings: Secondary | ICD-10-CM | POA: Insufficient documentation

## 2021-02-21 NOTE — Progress Notes (Signed)
Deborah Meadows 05-15-1965 [id number]   History:  56 y.o. [id number] presents for annual exam.  She complains of intermittent lower pelvic pain that feels like "pulling" or "cramping". She thinks this began about a year ago. It has remained the same in intensity but happens a little more often than when it first started. No vaginal bleeding. Denies constipation, diarrhea, or any other changes in bowels, although she did experience constipation August - November, reports bowel movements have been normal since. Postmenopausal - no HRT, no bleeding. 2019/2020 ASCUS negative HPV, normal 02/2020. PCP manages hyperlipidemia.  Gynecologic History Contraception/Family planning: post menopausal status Sexually active: Yes  Health Maintenance Last Pap: 02/21/2020. Results were: Normal Last mammogram: 05/08/2020. Results were: Normal Last colonoscopy: 03/24/2019. Results were: Normal, 5-year recall Last Dexa: Not indicated  Past medical history, past surgical history, family history and social history were all reviewed and documented in the EPIC chart. Married. Works for [employer]. 3 children - 90 yo daughter, 23 yo daughter at [employer], 60 yo son is senior at [employer].   ROS:  A ROS was performed and pertinent positives and negatives are included.  Exam:  Vitals:   02/21/21 1327  BP: 134/80  Weight: 127 lb (57.6 kg)  Height: 5' 2.5" (1.588 m)    Body mass index is 22.86 kg/m.  General appearance:  Normal Thyroid:  Symmetrical, normal in size, without palpable masses or nodularity. Respiratory  Auscultation:  Clear without wheezing or rhonchi Cardiovascular  Auscultation:  Regular rate, without rubs, murmurs or gallops  Edema/varicosities:  Not grossly evident Abdominal  Soft,nontender, without masses, guarding or rebound.  Liver/spleen:  No organomegaly noted  Hernia:  None appreciated  Skin  Inspection:  Grossly normal   Breasts: Examined lying and sitting.   Right: Without masses,  retractions, discharge or axillary adenopathy.   Left: Without masses, retractions, discharge or axillary adenopathy. Genitourinary   Inguinal/mons:  Normal without inguinal adenopathy  External genitalia:  Normal appearing vulva with no masses, tenderness, or lesions  BUS/Urethra/Skene's glands:  Normal  Vagina:  Normal appearing with normal color and discharge, no lesions. Atrophic changes  Cervix:  Normal appearing without discharge or lesions  Uterus:  Normal in size, shape and contour.  Midline and mobile, nontender  Adnexa/parametria:     Rt: Normal in size, without masses or tenderness.   Lt: Normal in size, without masses or tenderness.  Anus and perineum: Normal  Digital rectal exam: Normal sphincter tone without palpated masses or tenderness  Patient informed chaperone available to be present for breast and pelvic exam. Patient has requested no chaperone to be present. Patient has been advised what will be completed during breast and pelvic exam.   Assessment/Plan:  56 y.o. [id number] for annual exam.   Well female exam with routine gynecological exam - Plan: Cytology - PAP( [place]). Education provided on SBEs, importance of preventative screenings, current guidelines, high calcium diet, regular exercise, and multivitamin daily.  Labs with PCP.   Postmenopausal - no HRT, no bleeding  Pelvic pain - Plan: US PELVIS TRANSVAGINAL NON-OB (TV ONLY). She complains of intermittent lower pelvic pain that feels like "pulling" or "cramping". She thinks this began about a year ago. It has remained the same in intensity but happens a little more often than when it first started. No vaginal bleeding. Denies constipation, diarrhea, or any other changes in bowels, although she did experience constipation August - November, reports bowel movements have been normal since. Discussed this could be  GYN versus GI or [facility].   Screening for cervical cancer - 2019/2020 ASCUS negative HPV, normal 02/2020.  Pap with HR HPV today per guidelines.   Screening for breast cancer - Normal mammogram history.  Continue annual screenings.  Normal breast exam today.  Screening for colon cancer - 2021 colonoscopy.  Will repeat at 5-year interval per GI recommendation.   Screening for osteoporosis - Average risk. Will plan for DXA at age 53.   Follow-up in 1 year for annual.    Tamela Gammon [facility], 1:47 PM 02/21/2021

## 2021-02-22 LAB — CYTOLOGY - PAP
Comment: NEGATIVE
Diagnosis: NEGATIVE
High risk HPV: NEGATIVE

## 2021-03-22 ENCOUNTER — Other Ambulatory Visit: Payer: BC Managed Care – PPO

## 2021-03-22 ENCOUNTER — Other Ambulatory Visit: Payer: BC Managed Care – PPO | Admitting: Obstetrics and Gynecology

## 2021-03-23 ENCOUNTER — Encounter: Payer: Self-pay | Admitting: Family Medicine

## 2021-03-23 ENCOUNTER — Other Ambulatory Visit: Payer: Self-pay

## 2021-03-23 ENCOUNTER — Ambulatory Visit: Payer: BC Managed Care – PPO | Admitting: Family Medicine

## 2021-03-23 VITALS — BP 138/86 | HR 99 | Temp 99.0°F | Wt 123.8 lb

## 2021-03-23 DIAGNOSIS — J0100 Acute maxillary sinusitis, unspecified: Secondary | ICD-10-CM

## 2021-03-23 DIAGNOSIS — J209 Acute bronchitis, unspecified: Secondary | ICD-10-CM

## 2021-03-23 NOTE — Progress Notes (Addendum)
° °  Subjective:    Patient ID: Deborah Meadows, female    DOB: [birth year], 56 y.o.   MRN: [id number]  HPI She states that approximately 1 week ago she had nasal congestion, rhinorrhea and body nasal discharge.  That is slowly gotten better.  She is also having some purulent postnasal drainage with sinus pressure especially above her right teeth.  She states that she also then developed a cough that she notes occasionally will be rattling but no shortness of breath fever or chills.  She has had a negative COVID test.  Overall she states that she does feel better today.   Review of Systems     Objective:   Physical Exam Alert and in no distress.  Nasal mucosa is normal with no discharge or blood seen.  Slight tenderness over maxillary sinuses.  Tympanic membranes and canals are normal. Pharyngeal area is normal. Neck is supple without adenopathy or thyromegaly. Cardiac exam shows a regular sinus rhythm without murmurs or gallops. Lungs are clear to auscultation.        Assessment & Plan:  Acute bronchitis, unspecified organism  Acute maxillary sinusitis, recurrence not specified We discussed further therapy and since she is feeling better she is very comfortable waiting.  If her symptoms do not continue to improve over the weekend, she will call me. 03/24/21 She called for an antibiotic today. Azithromycin called in

## 2021-03-24 MED ORDER — AZITHROMYCIN 500 MG PO TABS: 500.0000 mg | ORAL_TABLET | Freq: Every day | ORAL | 0 refills | Status: DC

## 2021-03-24 NOTE — Addendum Note (Signed)
Addended by: Denita Lung on: 03/24/2021 01:54 PM   Modules accepted: Orders

## 2021-04-12 ENCOUNTER — Other Ambulatory Visit: Payer: Self-pay

## 2021-04-12 ENCOUNTER — Ambulatory Visit (INDEPENDENT_AMBULATORY_CARE_PROVIDER_SITE_OTHER): Payer: BC Managed Care – PPO

## 2021-04-12 ENCOUNTER — Encounter: Payer: Self-pay | Admitting: Obstetrics and Gynecology

## 2021-04-12 ENCOUNTER — Ambulatory Visit: Payer: BC Managed Care – PPO | Admitting: Obstetrics and Gynecology

## 2021-04-12 VITALS — BP 122/76 | HR 82 | Ht 62.5 in | Wt 127.0 lb

## 2021-04-12 DIAGNOSIS — D219 Benign neoplasm of connective and other soft tissue, unspecified: Secondary | ICD-10-CM

## 2021-04-12 DIAGNOSIS — R102 Pelvic and perineal pain: Secondary | ICD-10-CM | POA: Diagnosis not present

## 2021-04-12 NOTE — Progress Notes (Signed)
GYNECOLOGY  VISIT ?  ?HPI: ?56 y.o.   Married  Caucasian  female   ?[id number] with No LMP recorded. Patient has had an ablation.   ?here for pelvic ultrasound for pelvic cramping for one year.  ?Bilateral pelvic discomfort every day. ?States it feels like a light Braxton Hicks contraction.  ?No pain medication use.  ?Does not prevent her from doing anything.  ? ?Hx endometrial ablation and fibroid removal.  ? ?No periods.  ? ?Not associated with painful urination or blood in the urine.  ? ?Hx constipation last year, but is not currently an issue. ?Has abdominal bloating.  ?No known food intolerances.  ? ?GYNECOLOGIC HISTORY: ?No LMP recorded. Patient has had an ablation. ?Contraception:  PMP ?Menopausal hormone therapy:  None ?Last mammogram:  04-18-20 Neg/Birads2 ?Last pap smear:   02-21-21 Neg:Neg HR HPV, 02-21-20 Neg:Neg HR HPV, 01-05-19 ASCUS:Neg HR HPV ?       ?OB History   ? ? Gravida  ?6  ? Para  ?3  ? Term  ?   ? Preterm  ?   ? AB  ?3  ? Living  ?3  ?  ? ? SAB  ?3  ? IAB  ?   ? Ectopic  ?   ? Multiple  ?   ? Live Births  ?   ?   ?  ? Obstetric Comments  ?C/S x 1, VBAC x 2  ?  ? ?  ?    ? ?Patient Active Problem List  ? Diagnosis Date Noted  ? White coat syndrome without diagnosis of hypertension 12/10/2019  ? Vitamin D deficiency 10/06/2015  ? Rosacea 06/15/2014  ? Intramural leiomyoma of uterus 04/27/2014  ? Perimenopause 04/27/2014  ? Eczema 11/02/2012  ? ? ?Past Medical History:  ?Diagnosis Date  ? Anemia   ? borderline  ? Blood dyscrasia   ? had test 18 years ago. No diagnosis  ? Irregular heart beat   ? normal eval by Dr. Radford Pax in past  ? ? ?Past Surgical History:  ?Procedure Laterality Date  ? [place]  ? COLONOSCOPY    ? DILATATION & CURETTAGE/HYSTEROSCOPY WITH MYOSURE N/A 07/04/2015  ? Procedure: DILATATION & CURETTAGE/HYSTEROSCOPY WITH MYOSURE;  Surgeon: Terrance Mass, MD;  Location: [facility];  Service: Gynecology;  Laterality: N/A;  ? DILATION AND CURETTAGE OF UTERUS    ? x three  ?  HYSTEROSCOPY WITH NOVASURE N/A 07/04/2015  ? Procedure: Novasure ;  Surgeon: Terrance Mass, MD;  Location: [facility];  Service: Gynecology;  Laterality: N/A;  ? WISDOM TOOTH EXTRACTION    ? x2  ? ? ?Current Outpatient Medications  ?Medication Sig Dispense Refill  ? Cholecalciferol (VITAMIN D) 50 MCG (2000 UT) tablet Take 4,000 Units by mouth daily.    ? Pseudoephedrine-guaiFENesin (MUCINEX D PO) Take by mouth.    ? triamcinolone cream (KENALOG) 0.1 % APPLY TO ACTIVE RASH TWICE A DAY AS NEEDED    ? vitamin C (ASCORBIC ACID) 500 MG tablet Take 500 mg by mouth daily.    ? ?No current facility-administered medications for this visit.  ?  ? ?ALLERGIES: Penicillins ? ?Family History  ?Problem Relation Age of Onset  ? Hypertension Mother   ? Hyperlipidemia Father   ? Cancer Sister   ?     thyroid  ? Hypertension Sister   ? Heart disease Maternal Grandmother   ? Alzheimer's disease Paternal Grandmother   ? Diabetes Neg Hx   ? Colon  cancer Neg Hx   ? Rectal cancer Neg Hx   ? Stomach cancer Neg Hx   ? ? ?Social History  ? ?Socioeconomic History  ? Marital status: Married  ?  Spouse name: Not on file  ? Number of children: Not on file  ? Years of education: Not on file  ? Highest education level: Not on file  ?Occupational History  ? Occupation: works at [employer]  ?Tobacco Use  ? Smoking status: Never  ? Smokeless tobacco: Never  ?Vaping Use  ? Vaping Use: Never used  ?Substance and Sexual Activity  ? Alcohol use: Yes  ?  Alcohol/week: 10.0 standard drinks  ?  Types: 10 Glasses of wine per week  ?  Comment: 1 glass of wine (7 oz) daily  ? Drug use: No  ? Sexual activity: Not Currently  ?  Birth control/protection: Other-see comments  ?  Comment: vasectomy,des neg  ?Other Topics Concern  ? Not on file  ?Social History Narrative  ? Married, 3 children, 2 dogs.  Oldest is at [employer]. Middle at [place]. Son is at [employer].   ? 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  ? ?Social  Determinants of Health  ? ?Financial Resource Strain: Not on file  ?Food Insecurity: Not on file  ?Transportation Needs: Not on file  ?Physical Activity: Not on file  ?Stress: Not on file  ?Social Connections: Not on file  ?Intimate Partner Violence: Not on file  ? ? ?Review of Systems  ?All other systems reviewed and are negative. ? ?PHYSICAL EXAMINATION:   ? ?BP 122/76   Pulse 82   Ht 5' 2.5" (1.588 m)   Wt 127 lb (57.6 kg)   SpO2 98%   BMI 22.86 kg/m?     ?General appearance: alert, cooperative and appears stated age ? ?Pelvic US ?Uterus 6.38 x 4.38 x 3.14 cm.  ?2 subserosal fibroids 1.43 and 2.27 cm. ?EMS 1.52 mm.  ?Atrophic ovaries.  ?No adnexal masses.  ?No free fluid. ? ?ASSESSMENT ? ?Pelvic cramping.  ?Postmenopausal female.  ?Status post endometrial ablation.  ?Small uterine fibroids.  ? ?PLAN ? ?Pelvic US images and report reviewed.  ?Benign nature of fibroids discussed.  ?No obvious gynecologic cause for cramping.  ?I recommend follow up with PCP or gastroenterologist.  ?FU here prn.  ? ?An After Visit Summary was printed and given to the patient. ? ?11 min  total time was spent for this patient encounter, including preparation, face-to-face counseling with the patient, coordination of care, and documentation of the encounter. ? ? ?

## 2021-04-19 ENCOUNTER — Encounter: Payer: Self-pay | Admitting: Family Medicine

## 2021-04-19 DIAGNOSIS — R109 Unspecified abdominal pain: Secondary | ICD-10-CM

## 2021-04-24 ENCOUNTER — Other Ambulatory Visit: Payer: Self-pay | Admitting: Family Medicine

## 2021-04-24 DIAGNOSIS — Z1231 Encounter for screening mammogram for malignant neoplasm of breast: Secondary | ICD-10-CM

## 2021-05-09 ENCOUNTER — Ambulatory Visit
Admission: RE | Admit: 2021-05-09 | Discharge: 2021-05-09 | Disposition: A | Discharge status: Home / Self Care | Payer: BC Managed Care – PPO | Source: Ambulatory Visit | Attending: Family Medicine | Admitting: Family Medicine

## 2021-05-09 DIAGNOSIS — Z1231 Encounter for screening mammogram for malignant neoplasm of breast: Secondary | ICD-10-CM | POA: Diagnosis not present

## 2021-06-19 DIAGNOSIS — D225 Melanocytic nevi of trunk: Secondary | ICD-10-CM | POA: Diagnosis not present

## 2021-06-19 DIAGNOSIS — D2262 Melanocytic nevi of left upper limb, including shoulder: Secondary | ICD-10-CM | POA: Diagnosis not present

## 2021-06-19 DIAGNOSIS — D2261 Melanocytic nevi of right upper limb, including shoulder: Secondary | ICD-10-CM | POA: Diagnosis not present

## 2021-06-19 DIAGNOSIS — D2271 Melanocytic nevi of right lower limb, including hip: Secondary | ICD-10-CM | POA: Diagnosis not present

## 2021-06-22 ENCOUNTER — Other Ambulatory Visit: Payer: Self-pay

## 2021-06-22 ENCOUNTER — Emergency Department (HOSPITAL_BASED_OUTPATIENT_CLINIC_OR_DEPARTMENT_OTHER)
Admission: EM | Admit: 2021-06-22 | Discharge: 2021-06-22 | Disposition: A | Discharge status: Home / Self Care | Payer: BC Managed Care – PPO | Attending: Emergency Medicine | Admitting: Emergency Medicine

## 2021-06-22 ENCOUNTER — Encounter (HOSPITAL_BASED_OUTPATIENT_CLINIC_OR_DEPARTMENT_OTHER): Payer: Self-pay

## 2021-06-22 ENCOUNTER — Emergency Department (HOSPITAL_BASED_OUTPATIENT_CLINIC_OR_DEPARTMENT_OTHER): Payer: BC Managed Care – PPO

## 2021-06-22 DIAGNOSIS — R140 Abdominal distension (gaseous): Secondary | ICD-10-CM | POA: Insufficient documentation

## 2021-06-22 DIAGNOSIS — R1010 Upper abdominal pain, unspecified: Secondary | ICD-10-CM | POA: Insufficient documentation

## 2021-06-22 DIAGNOSIS — E871 Hypo-osmolality and hyponatremia: Secondary | ICD-10-CM | POA: Diagnosis not present

## 2021-06-22 DIAGNOSIS — R110 Nausea: Secondary | ICD-10-CM | POA: Diagnosis not present

## 2021-06-22 DIAGNOSIS — R42 Dizziness and giddiness: Secondary | ICD-10-CM | POA: Insufficient documentation

## 2021-06-22 LAB — COMPREHENSIVE METABOLIC PANEL
ALT: 18 U/L (ref 0–44)
AST: 36 U/L (ref 15–41)
Albumin: 4.9 g/dL (ref 3.5–5.0)
Alkaline Phosphatase: 55 U/L (ref 38–126)
Anion gap: 13 (ref 5–15)
BUN: 10 mg/dL (ref 6–20)
CO2: 25 mmol/L (ref 22–32)
Calcium: 9.7 mg/dL (ref 8.9–10.3)
Chloride: 100 mmol/L (ref 98–111)
Creatinine, Ser: 0.64 mg/dL (ref 0.44–1.00)
GFR, Estimated: >60 mL/min (ref >60)
Glucose, Bld: 92 mg/dL (ref 70–99)
Potassium: 3.4 mmol/L — ABNORMAL LOW (ref 3.5–5.1)
Sodium: 138 mmol/L (ref 135–145)
Total Bilirubin: 0.7 mg/dL (ref 0.3–1.2)
Total Protein: 7.4 g/dL (ref 6.5–8.1)

## 2021-06-22 LAB — CBC
HCT: 38.5 % (ref 36.0–46.0)
Hemoglobin: 12.9 g/dL (ref 12.0–15.0)
MCH: 32.4 pg (ref 26.0–34.0)
MCHC: 33.5 g/dL (ref 30.0–36.0)
MCV: 96.7 fL (ref 80.0–100.0)
Platelets: 212 10*3/uL (ref 150–400)
RBC: 3.98 MIL/uL (ref 3.87–5.11)
RDW: 12.3 % (ref 11.5–15.5)
WBC: 4.6 10*3/uL (ref 4.0–10.5)
nRBC: 0 % (ref 0.0–0.2)

## 2021-06-22 LAB — URINALYSIS, ROUTINE W REFLEX MICROSCOPIC
Bilirubin Urine: NEGATIVE
Glucose, UA: NEGATIVE mg/dL
Hgb urine dipstick: NEGATIVE
Ketones, ur: 15 mg/dL — AB
Leukocytes,Ua: NEGATIVE
Nitrite: NEGATIVE
Protein, ur: NEGATIVE mg/dL
Specific Gravity, Urine: 1.01 (ref 1.005–1.030)
pH: 6.5 (ref 5.0–8.0)

## 2021-06-22 LAB — LIPASE, BLOOD: Lipase: 30 U/L (ref 11–51)

## 2021-06-22 MED ORDER — MECLIZINE HCL 25 MG PO TABS: 25.0000 mg | ORAL_TABLET | Freq: Three times a day (TID) | ORAL | 0 refills | Status: AC | PRN

## 2021-06-22 MED ORDER — LORAZEPAM 1 MG PO TABS
1.0000 mg | ORAL_TABLET | Freq: Once | ORAL | Status: AC | PRN
  Administered 2021-06-22: 1 mg via ORAL
  Filled 2021-06-22: qty 1

## 2021-06-22 NOTE — ED Notes (Signed)
Pt reports dizziness ?

## 2021-06-22 NOTE — ED Notes (Signed)
Pt is in MRI  

## 2021-06-22 NOTE — ED Provider Notes (Signed)
?[facility] EMER[facility] DEPT ?Provider Note ? ? ?CSN: [id number] ?Arrival date & time: 06/22/21  1324 ? ?  ? ?History ? ?Chief Complaint  ?Patient presents with  ? Dizziness  ? ? ?Deborah Meadows is a 56 y.o. female. ? ? ?Dizziness ?Associated symptoms: no diarrhea and no nausea   ?Patient presents with dizziness.  Also some upper abdominal cramping and bloating.  Dizziness feels as if she is sort of waving back-and-forth like she is on a boat.  Has had for a few days but worse today.  States today she had an episode of her abdomen was cramping more and she felt her heart rate was going fast and felt bad.  States the dizziness started when she was getting her hair done and her head was tipped back.  States she normally has no problem with that.  No other numbness weakness.  States she is been able to drive all around.  Slight nauseousness at times. ?Abdomen feels somewhat crampy.  Not necessarily associated with eating.  No fevers or chills.  No change in her bowel habits. ?  ? ?Home Medications ?Prior to Admission medications   ?Medication Sig Start Date End Date Taking? Authorizing Provider  ?meclizine (ANTIVERT) 25 MG tablet Take 1 tablet (25 mg total) by mouth 3 (three) times daily as needed for dizziness. 06/22/21  Yes Davonna Belling, MD  ?Cholecalciferol (VITAMIN D) 50 MCG (2000 UT) tablet Take 4,000 Units by mouth daily.    [provider]  ?Pseudoephedrine-guaiFENesin ([place] D PO) Take by mouth.    [provider]  ?triamcinolone cream (KENALOG) 0.1 % APPLY TO ACTIVE RASH TWICE A DAY AS NEEDED 05/20/18   [provider]  ?vitamin C (ASCORBIC ACID) 500 MG tablet Take 500 mg by mouth daily.    [provider]  ?   ? ?Allergies    ?Penicillins   ? ?Review of Systems   ?Review of Systems  ?Constitutional:  Negative for appetite change.  ?HENT:  Positive for nosebleeds.   ?Gastrointestinal:  Positive for abdominal pain. Negative for diarrhea and nausea.   ?Musculoskeletal:  Negative for back pain.  ?Neurological:  Positive for dizziness.  ? ?Physical Exam ?Updated Vital Signs ?BP (!) 147/86 (BP Location: Right Arm)   Pulse 80   Temp 98.9 ?F (37.2 ?C) (Oral)   Resp 18   Ht '5\' 3"'$  (1.6 m)   Wt 56.7 kg   SpO2 99%   BMI 22.14 kg/m?  ?Physical Exam ?Vitals and nursing note reviewed.  ?Constitutional:   ?   Appearance: Normal appearance.  ?HENT:  ?   Head: Normocephalic.  ?Eyes:  ?   Pupils: Pupils are equal, round, and reactive to light.  ?Abdominal:  ?   Tenderness: There is no abdominal tenderness.  ?Musculoskeletal:     ?   General: No tenderness.  ?   Cervical back: Neck supple.  ?Skin: ?   General: Skin is warm.  ?   Capillary Refill: Capillary refill takes less than 2 seconds.  ?Neurological:  ?   Mental Status: She is alert and oriented to person, place, and time.  ?   Comments: Eye movements intact but does have horizontal nystagmus with gaze to left and right.  Conjugate gaze.  Face symmetric.  Finger-nose intact bilaterally.  Awake and appropriate.  ? ? ?ED Results / Procedures / Treatments   ?Labs ?(all labs ordered are listed, but only abnormal results are displayed) ?Labs Reviewed  ?COMPREHENSIVE METABOLIC PANEL -  Abnormal; Notable for the following components:  ?    Result Value  ? Potassium 3.4 (*)   ? All other components within normal limits  ?URINALYSIS, ROUTINE W REFLEX MICROSCOPIC - Abnormal; Notable for the following components:  ? Ketones, ur 15 (*)   ? All other components within normal limits  ?LIPASE, BLOOD  ?CBC  ? ? ?EKG ?EKG Interpretation ? ?Date/Time:  Friday Jun 22 2021 13:47:23 EDT ?Ventricular Rate:  79 ?PR Interval:  116 ?QRS Duration: 86 ?QT Interval:  376 ?QTC Calculation: 431 ?R Axis:   32 ?Text Interpretation: Normal sinus rhythm Normal ECG When compared with ECG of 13-May-2003 16:50, No significant change since last tracing Confirmed by Aletta Edouard [phone]) on 06/22/2021 1:51:51 PM ? ?Radiology ?MR BRAIN WO  CONTRAST ? ?Result Date: 06/22/2021 ?CLINICAL DATA:  Dizziness, non-specific EXAM: MRI HEAD WITHOUT CONTRAST TECHNIQUE: Multiplanar, multiecho pulse sequences of the brain and surrounding structures were obtained without intravenous contrast. COMPARISON:  None Available. FINDINGS: Brain: No acute infarction, hemorrhage, hydrocephalus, extra-axial collection or mass lesion. Vascular: Major arterial flow voids are maintained at the skull base. Skull and upper cervical spine: Normal marrow signal. Sinuses/Orbits: Clear sinuses.  No acute orbital findings. Other: No mastoid effusions. IMPRESSION: Normal brain MRI.  No evidence of acute abnormality. Electronically Signed   By: Margaretha Sheffield M.D.   On: 06/22/2021 16:37   ? ?Procedures ?Procedures  ? ? ?Medications Ordered in ED ?Medications  ?LORazepam (ATIVAN) tablet 1 mg (1 mg Oral Given 06/22/21 1548)  ? ? ?ED Course/ Medical Decision Making/ A&P ?  ?                        ?Medical Decision Making ?Amount and/or Complexity of Data Reviewed ?Labs: ordered. ?Radiology: ordered. ? ?Risk ?Prescription drug management. ? ? ?Patient with dizziness.  Has had over the last couple weeks.  Worse today.  Had an episode where she felt her heart racing and felt lightheaded with a 2.  Had some nauseousness.  Has had some abdominal cramping over the last couple weeks also.  Does have nystagmus.  Differential diagnosis for dizziness includes both central and peripheral causes of vertigo.  MRI done and reassuring.  I independently interpreted and showed no acute stroke.  Only mild hypokalemia.  May have some mild dehydration also with ketones in the urine.  Will treat symptomatically for peripheral vertigo with Antivert.  We will follow-up with PCP and ENT.  Benign abdominal exam and lab work reassuring without clear cause for any of the abdominal symptoms.  Patient already has follow-up. ? ? ? ? ? ? ? ?Final Clinical Impression(s) / ED Diagnoses ?Final diagnoses:  ?Vertigo  ? ? ?Rx  / DC Orders ?ED Discharge Orders   ? ?      Ordered  ?  meclizine (ANTIVERT) 25 MG tablet  3 times daily PRN       ? 06/22/21 1722  ? ?  ?  ? ?  ? ? ?  ?Davonna Belling, MD ?06/22/21 1742 ? ?

## 2021-06-22 NOTE — ED Triage Notes (Signed)
Pt BIB [facility] for dizziness. Pt has had a 2 week hx of upper abd cramping and bloating. Pt reports intermittent dizziness which became worse today. Pt notes its is precipitated by positional changes.  ? ?EMS Vitals  ? ?150/90 ?HR 88 NSR ?SpO2 98% on R/A ?CBG 104 ?

## 2021-06-25 ENCOUNTER — Encounter: Payer: Self-pay | Admitting: Family Medicine

## 2021-06-25 DIAGNOSIS — R42 Dizziness and giddiness: Secondary | ICD-10-CM

## 2021-06-26 ENCOUNTER — Encounter: Payer: Self-pay | Admitting: Gastroenterology

## 2021-07-17 ENCOUNTER — Other Ambulatory Visit (INDEPENDENT_AMBULATORY_CARE_PROVIDER_SITE_OTHER): Payer: BC Managed Care – PPO

## 2021-07-17 ENCOUNTER — Ambulatory Visit: Payer: BC Managed Care – PPO | Admitting: Physician Assistant

## 2021-07-17 ENCOUNTER — Encounter: Payer: Self-pay | Admitting: Physician Assistant

## 2021-07-17 VITALS — BP 112/64 | HR 92 | Ht 63.0 in | Wt 127.6 lb

## 2021-07-17 DIAGNOSIS — R140 Abdominal distension (gaseous): Secondary | ICD-10-CM

## 2021-07-17 DIAGNOSIS — R198 Other specified symptoms and signs involving the digestive system and abdomen: Secondary | ICD-10-CM

## 2021-07-17 LAB — BASIC METABOLIC PANEL
BUN: 15 mg/dL (ref 6–23)
CO2: 30 mEq/L (ref 19–32)
Calcium: 9.7 mg/dL (ref 8.4–10.5)
Chloride: 102 mEq/L (ref 96–112)
Creatinine, Ser: 0.59 mg/dL (ref 0.40–1.20)
GFR: 100.03 mL/min (ref >60.00)
Glucose, Bld: 88 mg/dL (ref 70–99)
Potassium: 4.5 mEq/L (ref 3.5–5.1)
Sodium: 140 mEq/L (ref 135–145)

## 2021-07-17 NOTE — Progress Notes (Signed)
Subjective:    Patient ID: Deborah Meadows, female    DOB: March 28, 1965, 56 y.o.   MRN: [id number]  HPI  Deborah Meadows is a pleasant 56 year old white female, established with Dr. Silverio Decamp and seen previously for colonoscopies.  She has history of adenomatous colon polyps.  Last colonoscopy was done in February 2021 with no recurrent polyps found, few sigmoid diverticuli and nonbleeding internal hemorrhoids.  She was indicated for 5-year interval follow-up. She comes in today with complaints of ongoing bothersome abdominal bloating which she says has been present for almost a year.  She is also having a pulling type discomfort bilaterally in the lower abdomen which she describes almost like Braxton Hicks contractions. She has  been evaluated by GYN within the past 2 months with a pelvic ultrasound which was negative.  Patient describes the sensation of fullness and bloating as constant, this does not seem to vary during the day or change with p.o. intake.  She says some days are worse than others.  She gave herself a trial of coming off of lactose without any change in symptoms.  Appetite has been okay, weight has been stable, no nausea or vomiting.  She describes normal bowel movements no issues with constipation though occasionally will have an episode of urgency for bowel movements.  No melena or hematochezia.  Again no change postprandially. Only prior abdominal surgery was a C-section x1 No recent abdominal imaging She did have recent labs done on 06/22/2021 after an ER visit with vertigo.  CBC was normal Potassium 3.4/BUN 10/creatinine 0.64, LFTs within normal limits and UA was negative.  Patient does consume alcohol on a daily basis, generally 1-1/2 glasses of wine per day.  Review of Systems Pertinent positive and negative review of systems were noted in the above HPI section.  All other review of systems was otherwise negative.   Outpatient Encounter Medications as of 07/17/2021  Medication Sig    Cholecalciferol (VITAMIN D) 50 MCG (2000 UT) tablet Take 4,000 Units by mouth daily.   meclizine (ANTIVERT) 25 MG tablet Take 1 tablet (25 mg total) by mouth 3 (three) times daily as needed for dizziness.   Pseudoephedrine-guaiFENesin (MUCINEX D PO) Take by mouth.   triamcinolone cream (KENALOG) 0.1 % APPLY TO ACTIVE RASH TWICE A DAY AS NEEDED   vitamin C (ASCORBIC ACID) 500 MG tablet Take 500 mg by mouth daily.   No facility-administered encounter medications on file as of 07/17/2021.   Allergies  Allergen Reactions   Penicillins Hives and Itching    Has patient had a PCN reaction causing immediate rash, facial/tongue/throat swelling, SOB or lightheadedness with hypotension: Yes Has patient had a PCN reaction causing severe rash involving mucus membranes or skin necrosis: No Has patient had a PCN reaction that required hospitalization No Has patient had a PCN reaction occurring within the last 10 years: No If all of the above answers are "NO", then may proceed with Cephalosporin use.    Patient Active Problem List   Diagnosis Date Noted   White coat syndrome without diagnosis of hypertension 12/10/2019   Vitamin D deficiency 10/06/2015   Rosacea 06/15/2014   Intramural leiomyoma of uterus 04/27/2014   Perimenopause 04/27/2014   Eczema 11/02/2012   Social History   Socioeconomic History   Marital status: Married    Spouse name: Not on file   Number of children: Not on file   Years of education: Not on file   Highest education level: Not on file  Occupational History  Occupation: works at [employer]  Tobacco Use   Smoking status: Never   Smokeless tobacco: Never  Vaping Use   Vaping Use: Never used  Substance and Sexual Activity   Alcohol use: Yes    Alcohol/week: 10.0 standard drinks    Types: 10 Glasses of wine per week    Comment: 1 glass of wine (7 oz) daily   Drug use: No   Sexual activity: Not Currently    Birth control/protection: Other-see comments     Comment: vasectomy,des neg  Other Topics Concern   Not on file  Social History Narrative   Married, 3 children, 2 dogs.  Oldest is at [employer]. Middle at [place]. Son is at [employer].    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   Social Determinants of Health   Financial Resource Strain: Not on file  Food Insecurity: Not on file  Transportation Needs: Not on file  Physical Activity: Not on file  Stress: Not on file  Social Connections: Not on file  Intimate Partner Violence: Not on file    Ms. Karren's family history includes Alzheimer's disease in her paternal grandmother; Cancer in her sister; Heart disease in her maternal grandmother; Hyperlipidemia in her father; Hypertension in her mother and sister.      Objective:    Vitals:   07/17/21 0906  BP: 112/64  Pulse: 92    Physical Exam Well-developed well-nourished older white female in no acute distress.  Height, Weight, 127 BMI 22.6  HEENT; nontraumatic normocephalic, EOMI, PE R LA, sclera anicteric. Oropharynx; not examined today Neck; supple, no JVD Cardiovascular; regular rate and rhythm with S1-S2, no murmur rub or gallop Pulmonary; Clear bilaterally Abdomen; soft, nontender, no visible distention ,no palpable mass or hepatosplenomegaly, bowel sounds are somewhat hyperactive, no appreciable fluid wave Rectal; not done today Skin; benign exam, no jaundice rash or appreciable lesions Extremities; no clubbing cyanosis or edema skin warm and dry Neuro/Psych; alert and oriented x4, grossly nonfocal mood and affect appropriate        Assessment & Plan:   #23 56 year old white female with almost 1 year history of constant sensation of abdominal fullness and bloating, also having intermittent pulling sensation in the bilateral lower quadrants  No change with p.o. intake, no nausea or vomiting.  Appetite okay and weight stable, no changes in bowel habits, no melena or  hematochezia.  Recent pelvic ultrasound reported negative  Etiology of symptoms is not clear, rule out occult malignancy, rule out other chronic intra-abdominal inflammatory process, no appreciable ascites on exam but needs to be considered, rule out functional.  #2 personal history of adenomatous colon polyps-up-to-date with colonoscopy last done February 2021, no recurrent polyps and indicated for 5-year interval follow-up #3 diverticulosis #4 small internal hemorrhoids #5.  Status post C-section  Plan; patient will be scheduled for CT scan of the abdomen and pelvis with contrast Be met today. Further recommendations pending findings at CT. If CT is negative, consider breath testing for SIBO, low gas diet etc.  [facility] Nurse Sheila Ocasio PA-C 07/17/2021   Cc: Rita Ohara, MD

## 2021-07-17 NOTE — Patient Instructions (Signed)
If you are age 58 or younger, your body mass index should be between 19-25. Your Body mass index is 22.6 kg/m. If this is out of the aformentioned range listed, please consider follow up with your Primary Care Provider.  ________________________________________________________  The [facility] providers would like to encourage you to use [facility] to communicate with providers for non-urgent requests or questions.  Due to long hold times on the telephone, sending your provider a message by [facility] may be a faster and more efficient way to get a response.  Please allow 48 business hours for a response.  Please remember that this is for non-urgent requests.  _______________________________________________________  Deborah Meadows have been scheduled for a CT scan of the abdomen and pelvis at [facility] ([address]---this is in the same building as [employer]).   You are scheduled on 07/24/2021 at 9:30 am. You should arrive 15 minutes prior to your appointment time for registration. Please follow the written instructions below on the day of your exam:  WARNING: IF YOU ARE ALLERGIC TO IODINE/X-RAY DYE, PLEASE NOTIFY RADIOLOGY IMMEDIATELY AT [phone]! YOU WILL BE GIVEN A 13 HOUR PREMEDICATION PREP.  1) Do not eat anything after 5:30 am (4 hours prior to your test) 2) You have been given 2 bottles of oral contrast to drink. The solution may taste better if refrigerated, but do NOT add ice or any other liquid to this solution. Shake well before drinking.    Drink 1 bottle of contrast @ 7:30 am (2 hours prior to your exam)  Drink 1 bottle of contrast @ 8:30 am (1 hour prior to your exam)  You may take any medications as prescribed with a small amount of water, if necessary. If you take any of the following medications: METFORMIN, GLUCOPHAGE, GLUCOVANCE, AVANDAMET, RIOMET, FORTAMET, Painter MET, JANUMET, GLUMETZA or METAGLIP, you MAY be asked to HOLD this medication 48 hours AFTER the  exam.  The purpose of you drinking the oral contrast is to aid in the visualization of your intestinal tract. The contrast solution may cause some diarrhea. Depending on your individual set of symptoms, you may also receive an intravenous injection of x-ray contrast/dye. Plan on being at [facility] for 30 minutes or longer, depending on the type of exam you are having performed.  This test typically takes 30-45 minutes to complete.  If you have any questions regarding your exam or if you need to reschedule, you may call the CT department at [phone] between the hours of 8:00 am and 5:00 pm, Monday-Friday.  ________________________________________________________________________  Your provider has requested that you go to the basement level for lab work before leaving today. Press "B" on the elevator. The lab is located at the first door on the left as you exit the elevator.  Follow up pending the results of you CT.  Thank you for entrusting me with your care and choosing [facility].  Amy Esterwood, PA-C

## 2021-07-19 DIAGNOSIS — R42 Dizziness and giddiness: Secondary | ICD-10-CM | POA: Diagnosis not present

## 2021-07-24 ENCOUNTER — Ambulatory Visit (INDEPENDENT_AMBULATORY_CARE_PROVIDER_SITE_OTHER)
Admission: RE | Admit: 2021-07-24 | Discharge: 2021-07-24 | Disposition: A | Discharge status: Home / Self Care | Payer: BC Managed Care – PPO | Source: Ambulatory Visit | Attending: Physician Assistant | Admitting: Physician Assistant

## 2021-07-24 DIAGNOSIS — R198 Other specified symptoms and signs involving the digestive system and abdomen: Secondary | ICD-10-CM

## 2021-07-24 DIAGNOSIS — D259 Leiomyoma of uterus, unspecified: Secondary | ICD-10-CM | POA: Diagnosis not present

## 2021-07-24 DIAGNOSIS — R140 Abdominal distension (gaseous): Secondary | ICD-10-CM | POA: Diagnosis not present

## 2021-07-24 DIAGNOSIS — K5730 Diverticulosis of large intestine without perforation or abscess without bleeding: Secondary | ICD-10-CM | POA: Diagnosis not present

## 2021-07-24 DIAGNOSIS — K7689 Other specified diseases of liver: Secondary | ICD-10-CM | POA: Diagnosis not present

## 2021-07-24 MED ORDER — IOHEXOL 300 MG/ML  SOLN
100.0000 mL | Freq: Once | INTRAMUSCULAR | Status: AC | PRN
  Administered 2021-07-24: 100 mL via INTRAVENOUS

## 2021-07-25 ENCOUNTER — Ambulatory Visit: Payer: BC Managed Care – PPO | Admitting: Gastroenterology

## 2021-07-25 ENCOUNTER — Encounter: Payer: Self-pay | Admitting: Family Medicine

## 2021-07-26 ENCOUNTER — Telehealth: Payer: Self-pay

## 2021-07-26 ENCOUNTER — Encounter: Payer: Self-pay | Admitting: Family Medicine

## 2021-07-26 ENCOUNTER — Encounter: Payer: Self-pay | Admitting: Nurse Practitioner

## 2021-07-26 ENCOUNTER — Other Ambulatory Visit: Payer: Self-pay

## 2021-07-26 DIAGNOSIS — K869 Disease of pancreas, unspecified: Secondary | ICD-10-CM

## 2021-07-26 DIAGNOSIS — R140 Abdominal distension (gaseous): Secondary | ICD-10-CM

## 2021-07-26 DIAGNOSIS — K769 Liver disease, unspecified: Secondary | ICD-10-CM

## 2021-07-26 NOTE — Telephone Encounter (Signed)
Patient received my message. She has the following concerns.  Ken[place], Henckel D  P [facility] Clinical Pool Phone Number: [phone]   Hi Amy!  I read your summary of the CT.  Beth called me and I returned her call,  but we haven't spoken.  I find most most of these results comforting except the pancreas . I have questions thar I'll leave here and hopefully we can discuss.  Are cysts filled with fluid, if so, can it be drained and somehow tested for cancer related issues?  Do cysts turn to cancer?  Do I need to see a pancreas doctor? (Not sure what that's called)  I'd like a another test in 3 months not 6.  And last,  you hear stories all the time that too much time went by and then there is a bad stage of cancer.  I do not want that option.  Please understand that I'm trying to be very proactive about this.  Thank you for your help!  Deborah Meadows   Please advise.

## 2021-07-27 ENCOUNTER — Other Ambulatory Visit: Payer: Self-pay

## 2021-07-27 DIAGNOSIS — K869 Disease of pancreas, unspecified: Secondary | ICD-10-CM

## 2021-07-27 DIAGNOSIS — R140 Abdominal distension (gaseous): Secondary | ICD-10-CM

## 2021-07-27 NOTE — Addendum Note (Signed)
Addended by: Virgina Evener A on: 07/27/2021 09:55 AM   Modules accepted: Orders

## 2021-08-01 NOTE — Progress Notes (Unsigned)
  Chief Complaint  Patient presents with   Dizziness    Feels swimmy headed, like she had been on a cruise in a storm. Started when she went to get her hair done and she put her head back into the sink 4 weeks ago. Lasted about a week. Went away.     Patient presents to f/u on Vertigo.  She was seen in the ER on 5/12 with dizziness and upper abdominal cramping and bloating.   Dizziness started when getting her hair done, head tipped back.  Was described as "waving back-and-forth like she is on a boat" per ER notes. Slight nausea, no vomiting. No f/c Had an episode where she felt her heart racing and felt lightheaded. Exam notable for horizontal nystagmus (with gaze to left and right), otherwise normal. Labs notable for mild hypokalemia.  May have some mild dehydration also with ketones in the urine. She was treated symptomatically for peripheral vertigo with Antivert.   She was referred to ENT.  She saw Dr. Redmond Baseman earlier this month.  Per note, she as feeling better at that time; he didn't feel there was an inner-ear pathology.  Went on for a week, mainly in the morning Got worse, went to ER, head felt jumbled--could drive, read, work. Like "being on a cruise ship in a terrible storm" After leaving the hospital it was over.  Had valium for MRI, wentto bed. Since then, only a slight bit for a day or two, a very fleeting sensation.  Never had tinnitus or hearing loss, no ear pressure/pain.   Had BPV over 20 years ago.  She saw GI for complaints of abdominal bloating.  She had negative pelvic US per GYN. CT was done which showed: IMPRESSION: 1. No acute process identified. 2. 6 mm hypodense likely cystic lesion in the head of the pancreas. Recommend initial follow-up contrast-enhanced MRI abdomen or pancreas protocol CT in 6 months. 3. Colonic diverticulosis. 4. Fibroid uterus.   She is scheduled for MRI in July.  Isthmus cyst and R superior cyst--smaller on 04/2020 [place] To call  Eulas Post if desires aspiration.  Denies change, or symptoms.   PHYSICAL EXAM: BP (!) 156/90   Pulse 72   Ht '5\' 3"'$  (1.6 m)   Wt 127 lb (57.6 kg)   LMP  (Exact Date)   BMI 22.50 kg/m   153/92 on her wrist monitor. BP's normal the day prior--see MyChart message.   ASSESSMENT/PLAN   Deborah Meadows--ENT at [facility], in [place]. Sister's thyroid doctor

## 2021-08-02 ENCOUNTER — Ambulatory Visit: Payer: BC Managed Care – PPO | Admitting: Family Medicine

## 2021-08-02 ENCOUNTER — Encounter: Payer: Self-pay | Admitting: Family Medicine

## 2021-08-02 VITALS — BP 156/90 | HR 72 | Ht 63.0 in | Wt 127.0 lb

## 2021-08-02 DIAGNOSIS — K862 Cyst of pancreas: Secondary | ICD-10-CM | POA: Diagnosis not present

## 2021-08-02 DIAGNOSIS — R42 Dizziness and giddiness: Secondary | ICD-10-CM | POA: Diagnosis not present

## 2021-08-02 DIAGNOSIS — R030 Elevated blood-pressure reading, without diagnosis of hypertension: Secondary | ICD-10-CM

## 2021-08-02 DIAGNOSIS — E041 Nontoxic single thyroid nodule: Secondary | ICD-10-CM | POA: Diagnosis not present

## 2021-08-03 ENCOUNTER — Encounter: Payer: Self-pay | Admitting: Family Medicine

## 2021-08-11 ENCOUNTER — Ambulatory Visit
Admission: RE | Admit: 2021-08-11 | Discharge: 2021-08-11 | Disposition: A | Discharge status: Home / Self Care | Payer: BC Managed Care – PPO | Source: Ambulatory Visit | Attending: Physician Assistant | Admitting: Physician Assistant

## 2021-08-11 DIAGNOSIS — R140 Abdominal distension (gaseous): Secondary | ICD-10-CM

## 2021-08-11 DIAGNOSIS — K869 Disease of pancreas, unspecified: Secondary | ICD-10-CM

## 2021-08-17 ENCOUNTER — Other Ambulatory Visit: Payer: Self-pay

## 2021-08-17 DIAGNOSIS — K869 Disease of pancreas, unspecified: Secondary | ICD-10-CM

## 2021-08-17 DIAGNOSIS — R1900 Intra-abdominal and pelvic swelling, mass and lump, unspecified site: Secondary | ICD-10-CM

## 2021-08-17 DIAGNOSIS — R140 Abdominal distension (gaseous): Secondary | ICD-10-CM

## 2021-08-26 ENCOUNTER — Other Ambulatory Visit: Payer: BC Managed Care – PPO

## 2021-08-30 DIAGNOSIS — H5213 Myopia, bilateral: Secondary | ICD-10-CM | POA: Diagnosis not present

## 2021-08-30 DIAGNOSIS — H2513 Age-related nuclear cataract, bilateral: Secondary | ICD-10-CM | POA: Diagnosis not present

## 2021-09-04 ENCOUNTER — Telehealth: Payer: Self-pay | Admitting: Physician Assistant

## 2021-09-04 NOTE — Telephone Encounter (Signed)
Inbound call from [facility] with [facility] stating patient needs a new authorization for upcoming MRI appointment. Elmyra Ricks can be reached directly at [phone]. Please give a call back to advise.  Thank you

## 2021-09-06 ENCOUNTER — Telehealth: Payer: Self-pay | Admitting: Physician Assistant

## 2021-09-06 NOTE — Telephone Encounter (Signed)
Received a call from Anda Kraft at [facility] stating patient went for her MRI of the Abdomen.  Anda Kraft states patient was claustrophobic and they were unable to do the test.  She just wanted you to have this information for your records.  Thank you.

## 2021-09-06 NOTE — Telephone Encounter (Signed)
Do you want to order this MRI for evaluation of her pancreas to be done under general anesthesia?

## 2021-09-10 ENCOUNTER — Other Ambulatory Visit: Payer: Self-pay

## 2021-09-10 DIAGNOSIS — R140 Abdominal distension (gaseous): Secondary | ICD-10-CM

## 2021-09-10 DIAGNOSIS — K869 Disease of pancreas, unspecified: Secondary | ICD-10-CM

## 2021-09-20 ENCOUNTER — Other Ambulatory Visit: Payer: Self-pay

## 2021-09-20 DIAGNOSIS — R140 Abdominal distension (gaseous): Secondary | ICD-10-CM

## 2021-09-20 DIAGNOSIS — K869 Disease of pancreas, unspecified: Secondary | ICD-10-CM

## 2021-09-25 ENCOUNTER — Telehealth: Payer: Self-pay | Admitting: Physician Assistant

## 2021-09-25 ENCOUNTER — Other Ambulatory Visit: Payer: Self-pay

## 2021-09-25 DIAGNOSIS — R140 Abdominal distension (gaseous): Secondary | ICD-10-CM

## 2021-09-25 DIAGNOSIS — K869 Disease of pancreas, unspecified: Secondary | ICD-10-CM

## 2021-09-25 NOTE — Telephone Encounter (Signed)
Left msg on pt's vm informing that the only site approved to perform her CT scan was [place] per [facility])

## 2021-09-28 DIAGNOSIS — K862 Cyst of pancreas: Secondary | ICD-10-CM | POA: Diagnosis not present

## 2021-09-29 ENCOUNTER — Other Ambulatory Visit (HOSPITAL_BASED_OUTPATIENT_CLINIC_OR_DEPARTMENT_OTHER): Payer: BC Managed Care – PPO

## 2021-09-30 ENCOUNTER — Other Ambulatory Visit (HOSPITAL_BASED_OUTPATIENT_CLINIC_OR_DEPARTMENT_OTHER): Payer: BC Managed Care – PPO

## 2021-10-08 ENCOUNTER — Ambulatory Visit: Payer: BC Managed Care – PPO | Admitting: Gastroenterology

## 2021-10-17 ENCOUNTER — Encounter: Payer: Self-pay | Admitting: Internal Medicine

## 2021-10-24 ENCOUNTER — Ambulatory Visit: Payer: BC Managed Care – PPO | Admitting: Physician Assistant

## 2021-11-20 NOTE — Patient Instructions (Addendum)
  HEALTH MAINTENANCE RECOMMENDATIONS:  It is recommended that you get at least 30 minutes of aerobic exercise at least 5 days/week (for weight loss, you may need as much as 60-90 minutes). This can be any activity that gets your heart rate up. This can be divided in 10-15 minute intervals if needed, but try and build up your endurance at least once a week.  Weight bearing exercise is also recommended twice weekly.  Eat a healthy diet with lots of vegetables, fruits and fiber.  "Colorful" foods have a lot of vitamins (ie green vegetables, tomatoes, red peppers, etc).  Limit sweet tea, regular sodas and alcoholic beverages, all of which has a lot of calories and sugar.  Up to 1 alcoholic drink daily may be beneficial for women (unless trying to lose weight, watch sugars).  Drink a lot of water.  Calcium recommendations are 1200-1500 mg daily (1500 mg for postmenopausal women or women without ovaries), and vitamin D 1000 IU daily.  This should be obtained from diet and/or supplements (vitamins), and calcium should not be taken all at once, but in divided doses.  Monthly self breast exams and yearly mammograms for women over the age of 64 is recommended.  Sunscreen of at least SPF 30 should be used on all sun-exposed parts of the skin when outside between the hours of 10 am and 4 pm (not just when at beach or pool, but even with exercise, golf, tennis, and yard work!)  Use a sunscreen that says "broad spectrum" so it covers both UVA and UVB rays, and make sure to reapply every 1-2 hours.  Remember to change the batteries in your smoke detectors when changing your clock times in the spring and fall. Carbon monoxide detectors are recommended for your home.  Use your seat belt every time you are in a car, and please drive safely and not be distracted with cell phones and texting while driving.  I recommend getting the COVID booster in 2 weeks (from today's flu shot).  I recommend getting the new shingles  vaccine (Shingrix). You may want to check with your insurance to verify what your out of pocket cost may be (usually covered as preventative, but better to verify to avoid any surprises, as this vaccine is expensive), and then schedule a nurse visit at our office when convenient (based on the possible side effects as discussed).   This is a series of 2 injections, spaced 2 months apart.  It doesn't have to be exactly 2 months apart (but can't be sooner), if that isn't feasible for your schedule, but try and get them close to 2 months (and definitely within 6 months of each other, or else the efficacy of the vaccine drops off). This should be separated from other vaccines by at least 2 weeks.

## 2021-11-20 NOTE — Progress Notes (Signed)
Chief Complaint  Patient presents with   Annual Exam    Fasting annual exam. Had nosebleed this morning on way, did get it to stop. No new concerns. Will take flu shot today and will get covid booster at [facility].     Deborah Meadows is a 56 y.o. female who presents for a complete physical.  She has the following concerns:  She is asking for an A1c for diabetes screening (family members have been talking about abnormal A1c's recently. No known diabetes.  She had seen ortho for bilateral knee pain.  MRI's done 09/2020--partial tear of medial head of gastroc on L, small leaking Baker's cyst. R knee, some meniscus changes (med and lat), tendinosis of medial gastroc head, likely calf strain (intramuscular edema noted). She completed PT. She saw Dr. Delilah Shan, who gave trial of topical nitroglycerin (to gastroc), which she never tried  She would have pain with standing all day (at work) is what causes her the most discomfort, discomfort behind her knees and into her posterior thighs.  Today she reports this has completely resolved.  She had vertigo in May (ER visit, seen here in June for f/u).  Denies any significant recurrence.  She saw GI for complaints of abdominal bloating.  She had negative pelvic US per GYN. CT was done which showed 6 mm hypodense likely cystic lesion in the head of the pancreas, as well as colonic diverticulosis and fibroid uterus. MRI was recommended, but patient couldn't handle even the open MRI. Instead she had CT done (pancreas protocol) in August at [facility]: Stable 6 mm cyst in the pancreatic head.  Recommend contrast enhanced MRI or CT in 12 months Bernette Redbird White Paper Recommendations, 2017) to document stability.  She has an appt with Dr. Silverio Decamp 10/18. She continues to have GI issues--acute onset of diarrhea.  Denies any warning or known trigger.  Had an episode in August, and another one this past weekend. She can't think of any trigger or stress.   Cysts  on thyroid, imaged on 04/2020.  She denies any change to the isthmus cyst, denies any symptoms related to the cyst.  She is aware that if it becomes bothersome she can f/u with radiology (spoke to Dr. Wallene Dales about it) for aspiration. She is asking for repeat US, just because she worries. She was advised last [place] was 04/2020, thought it had been longer, and is okay not to do.  White coat HTN--her wrist BP monitor has been verified as accurate (last in 07/2021).  BP's at home are running 113-130/77-88. It was 113./77 this morning.   Hypercholesterolemia--she has had excellent HDL.  Her LDL cholesterol periodically is high, related to diet (up to 163, was 140 in 2022). She continues to be careful with her diet--2 yolks/week (having more egg whites, fewer yolks), cut back on pizza (has only every 5-6 weeks, 1.5 pieces).  Current diet: A little more pizza, but more than last time (not weekly like when it was high in the past), usually just 1 piece.  Occ egg salad (with mayo), in frequent. Some chips.  Lab Results  Component Value Date   CHOL 255 (H) 11/14/2020   HDL 96 11/14/2020   LDLCALC 140 (H) 11/14/2020   TRIG 111 11/14/2020   CHOLHDL 2.7 11/14/2020    Vitamin D deficiency was noted in 2017, with a level of 26.  Last level was 63.6 in 11/2020, when taking 4000 IU 3x/week. Currently taking the same supplements. She drinks 2 cups  of skim Fairlife milk daily.   Immunization History  Administered Date(s) Administered   Influenza Split 11/01/2010   Influenza,inj,Quad PF,6+ Mos 11/02/2012, 10/05/2015, 10/09/2016, 10/15/2017, 10/28/2018, 11/11/2019, 11/13/2020   PFIZER(Purple Top)SARS-COV-2 Vaccination 05/26/2019, 06/16/2019   Pfizer Covid-19 Vaccine Bivalent Booster 55yr & up 10/19/2020   Tdap 11/02/2012   Last Pap smear: 02/2021, normal, negative high risk HPV (through GYN) Last mammogram: 04/2021 Last colonoscopy: 03/2019--diverticulosis, small internal hemorrhoids.  No polyps.  Repeat 5  years (had 169mtubular adenoma in 02/2016). Last DEXA: never  Dentist:  twice yearly Ophtho: yearly Exercise:  [place] app averaging 30 mins day, 7 days/week. Does "full body strength"--strength and cardio.  "strolls" with her dogs every night. 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   PMH, PSH, SH and FH were reviewed and updated  Outpatient Encounter Medications as of 11/22/2021  Medication Sig Note   Cholecalciferol (VITAMIN D) 50 MCG (2000 UT) tablet Take 4,000 Units by mouth daily. 11/13/2020: Taking 4000 IU 3x/week   vitamin C (ASCORBIC ACID) 500 MG tablet Take 500 mg by mouth daily.    meclizine (ANTIVERT) 25 MG tablet Take 1 tablet (25 mg total) by mouth 3 (three) times daily as needed for dizziness. (Patient not taking: Reported on 08/02/2021) 11/22/2021: prn   triamcinolone cream (KENALOG) 0.1 % APPLY TO ACTIVE RASH TWICE A DAY AS NEEDED (Patient not taking: Reported on 08/02/2021) 08/02/2021: prn   [DISCONTINUED] Pseudoephedrine-guaiFENesin (MUEhrenfeld PO) Take by mouth. (Patient not taking: Reported on 08/02/2021) 08/02/2021: prn   No facility-administered encounter medications on file as of 11/22/2021.     ROS:  The patient denies anorexia, fever, weight changes, headaches, vision changes, decreased hearing, ear pain, sore throat, breast concerns, dizziness, syncope, dyspnea on exertion, cough, swelling, nausea, vomiting, constipation, abdominal pain, melena, hematochezia, indigestion/heartburn, hematuria, incontinence, dysuria, vaginal bleeding, discharge, odor or itch, genital lesions, joint pains, numbness, tingling, weakness, tremor, suspicious skin lesions, depression, anxiety, abnormal bleeding/bruising, or enlarged lymph nodes. Postmenopausal.  Only mild hot flashes. No sweats. Some chronic postnasal drainage. Nosebleed today--first one since the ones in Feb/March/April Eczema--occasionally flares behind her ears, and on neck, uses steroid cream  prn. Sees derm for skin checks. Knee pain resolved. Rare palpitations, unchanged. No change to cyst in neck. Intermittent diarrhea per HPI.    PHYSICAL EXAM:  BP (!) 150/90   Pulse 100   Ht 5' 4" (1.626 m)   Wt 132 lb (59.9 kg)   BMI 22.66 kg/m  130/84 on repeat by MD  Wt Readings from Last 3 Encounters:  11/22/21 132 lb (59.9 kg)  08/02/21 127 lb (57.6 kg)  07/17/21 127 lb 9.6 oz (57.9 kg)   General Appearance:    Alert, cooperative, no distress, appears stated age. She declined getting changed into gown today (sees GYN, derm)  Head:    Normocephalic, without obvious abnormality, atraumatic  Eyes:   PERRL, conjunctiva/corneas clear, EOM's intact, fundi benign  Ears:    Normal TM's and external ear canals.  Nose:   Normal, no drainage or sinus tenderness  Throat:   Normal, no lesions  Neck:  Supple, no lymphadenopathy; thyroid: nontender. There is a 1-1.5cm soft, superficial, mobile mass just to the left of midline over thyroid, unchanged from prior visit. No carotid bruit or JVD  Back:    Spine nontender, no curvature, ROM normal, no CVA  tenderness  Lungs:   Clear to auscultation bilaterally without wheezes, rales or ronchi;  respirations unlabored  Chest Wall:    No tenderness or deformity   Heart:    Regular rate and rhythm, S1 and S2 normal, no murmur, rub or gallop.  Breast Exam:    Deferred to GYN  Abdomen:     Soft, non-tender, nondistended, normoactive bowel sounds, no masses, no hepatosplenomegaly  Genitalia:    Deferred to GYN       Extremities:   No clubbing, cyanosis or edema.  Pulses:   2+ and symmetric all extremities  Skin:   Skin color, texture, turgor normal. Exam limited (not changed into gown). She has an abrasion to her left forearm (puppy got to her arm this morning)--no active bleeding, no surrounding erythema or soft tissue swelling.  (Cleaned/bacitracin and bandage applied at visit).  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  Risks/SE reviewed, and to check with her insurance, schedule NV   Cbc and chem checked in May through ER, b-met repeated in June at GI; not needed Lipids only recommended.  Patient wants A1c, c-met, cbc, TSH checked today.  Discussed monthly self breast exams and yearly mammograms; at least 30 minutes of aerobic activity at least 5 days/week, weight-bearing exercise at least 2x/week; proper sunscreen use reviewed; healthy diet, including goals of calcium and vitamin D intake and alcohol recommendations (less than or equal to 1 drink/day) reviewed; regular seatbelt use; changing batteries in smoke detectors, carbon monoxide detectors.  Immunization recommendations discussed--flu shot given today.  COVID booster--prefers to get at pharmacy in 2 weeks. Shingrix discussed, recommended. To check insurance and return for NV when desired.   Colonoscopy recommendations reviewed, UTD (due again 03/2024)  F/u 1 year, sooner prn.

## 2021-11-21 ENCOUNTER — Encounter: Payer: BC Managed Care – PPO | Admitting: Family Medicine

## 2021-11-22 ENCOUNTER — Ambulatory Visit: Payer: BC Managed Care – PPO | Admitting: Family Medicine

## 2021-11-22 ENCOUNTER — Encounter: Payer: Self-pay | Admitting: Family Medicine

## 2021-11-22 VITALS — BP 130/84 | HR 100 | Ht 64.0 in | Wt 132.0 lb

## 2021-11-22 DIAGNOSIS — E7800 Pure hypercholesterolemia, unspecified: Secondary | ICD-10-CM | POA: Diagnosis not present

## 2021-11-22 DIAGNOSIS — Z23 Encounter for immunization: Secondary | ICD-10-CM

## 2021-11-22 DIAGNOSIS — Z0000 Encounter for general adult medical examination without abnormal findings: Secondary | ICD-10-CM

## 2021-11-22 DIAGNOSIS — K862 Cyst of pancreas: Secondary | ICD-10-CM | POA: Diagnosis not present

## 2021-11-22 DIAGNOSIS — Z131 Encounter for screening for diabetes mellitus: Secondary | ICD-10-CM | POA: Diagnosis not present

## 2021-11-22 DIAGNOSIS — E041 Nontoxic single thyroid nodule: Secondary | ICD-10-CM | POA: Diagnosis not present

## 2021-11-22 DIAGNOSIS — Z7185 Encounter for immunization safety counseling: Secondary | ICD-10-CM

## 2021-11-22 LAB — POCT URINALYSIS DIP (PROADVANTAGE DEVICE)
Bilirubin, UA: NEGATIVE
Blood, UA: NEGATIVE
Glucose, UA: NEGATIVE mg/dL
Ketones, POC UA: NEGATIVE mg/dL
Leukocytes, UA: NEGATIVE
Nitrite, UA: NEGATIVE
Protein Ur, POC: NEGATIVE mg/dL
Specific Gravity, Urine: 1.015
Urobilinogen, Ur: NEGATIVE
pH, UA: 6 (ref 5.0–8.0)

## 2021-11-23 LAB — COMPREHENSIVE METABOLIC PANEL
ALT: 21 IU/L (ref 0–32)
AST: 37 IU/L (ref 0–40)
Albumin/Globulin Ratio: 2.1 (ref 1.2–2.2)
Albumin: 4.8 g/dL (ref 3.8–4.9)
Alkaline Phosphatase: 79 IU/L (ref 44–121)
BUN/Creatinine Ratio: 24 — ABNORMAL HIGH (ref 9–23)
BUN: 14 mg/dL (ref 6–24)
Bilirubin Total: 0.3 mg/dL (ref 0.0–1.2)
CO2: 23 mmol/L (ref 20–29)
Calcium: 9.4 mg/dL (ref 8.7–10.2)
Chloride: 100 mmol/L (ref 96–106)
Creatinine, Ser: 0.58 mg/dL (ref 0.57–1.00)
Globulin, Total: 2.3 g/dL (ref 1.5–4.5)
Glucose: 90 mg/dL (ref 70–99)
Potassium: 4.5 mmol/L (ref 3.5–5.2)
Sodium: 141 mmol/L (ref 134–144)
Total Protein: 7.1 g/dL (ref 6.0–8.5)
eGFR: 105 mL/min/{1.73_m2} (ref >59)

## 2021-11-23 LAB — LIPID PANEL
Chol/HDL Ratio: 2.7 ratio (ref 0.0–4.4)
Cholesterol, Total: 279 mg/dL — ABNORMAL HIGH (ref 100–199)
HDL: 103 mg/dL (ref >39)
LDL Chol Calc (NIH): 167 mg/dL — ABNORMAL HIGH (ref 0–99)
Triglycerides: 62 mg/dL (ref 0–149)
VLDL Cholesterol Cal: 9 mg/dL (ref 5–40)

## 2021-11-23 LAB — CBC WITH DIFFERENTIAL/PLATELET
Basophils Absolute: 0 10*3/uL (ref 0.0–0.2)
Basos: 0 %
EOS (ABSOLUTE): 0 10*3/uL (ref 0.0–0.4)
Eos: 0 %
Hematocrit: 38.4 % (ref 34.0–46.6)
Hemoglobin: 12.6 g/dL (ref 11.1–15.9)
Immature Grans (Abs): 0 10*3/uL (ref 0.0–0.1)
Immature Granulocytes: 0 %
Lymphocytes Absolute: 0.7 10*3/uL (ref 0.7–3.1)
Lymphs: 16 %
MCH: 32.2 pg (ref 26.6–33.0)
MCHC: 32.8 g/dL (ref 31.5–35.7)
MCV: 98 fL — ABNORMAL HIGH (ref 79–97)
Monocytes Absolute: 0.6 10*3/uL (ref 0.1–0.9)
Monocytes: 13 %
Neutrophils Absolute: 3.3 10*3/uL (ref 1.4–7.0)
Neutrophils: 71 %
Platelets: 186 10*3/uL (ref 150–450)
RBC: 3.91 x10E6/uL (ref 3.77–5.28)
RDW: 12.1 % (ref 11.7–15.4)
WBC: 4.7 10*3/uL (ref 3.4–10.8)

## 2021-11-23 LAB — TSH: TSH: 1.78 u[IU]/mL (ref 0.450–4.500)

## 2021-11-23 LAB — HEMOGLOBIN A1C
Est. average glucose Bld gHb Est-mCnc: 108 mg/dL
Hgb A1c MFr Bld: 5.4 % (ref 4.8–5.6)

## 2021-11-27 ENCOUNTER — Encounter: Payer: Self-pay | Admitting: Family Medicine

## 2021-11-28 ENCOUNTER — Telehealth: Payer: BC Managed Care – PPO | Admitting: Gastroenterology

## 2021-11-28 ENCOUNTER — Encounter: Payer: Self-pay | Admitting: Gastroenterology

## 2021-11-28 ENCOUNTER — Other Ambulatory Visit (INDEPENDENT_AMBULATORY_CARE_PROVIDER_SITE_OTHER): Payer: BC Managed Care – PPO

## 2021-11-28 DIAGNOSIS — K580 Irritable bowel syndrome with diarrhea: Secondary | ICD-10-CM

## 2021-11-28 DIAGNOSIS — R140 Abdominal distension (gaseous): Secondary | ICD-10-CM

## 2021-11-28 DIAGNOSIS — R1084 Generalized abdominal pain: Secondary | ICD-10-CM

## 2021-11-28 LAB — SEDIMENTATION RATE: Sed Rate: 34 mm/hr — ABNORMAL HIGH (ref 0–30)

## 2021-11-28 LAB — C-REACTIVE PROTEIN: CRP: 2.8 mg/dL (ref 0.5–20.0)

## 2021-11-28 NOTE — Patient Instructions (Addendum)
If you are age 58 or younger, your body mass index should be between 19-25. Your Body mass index is 22.66 kg/m. If this is out of the aformentioned range listed, please consider follow up with your Primary Care Provider.  ________________________________________________________  The [facility] providers would like to encourage you to use [facility] to communicate with providers for non-urgent requests or questions.  Due to long hold times on the telephone, sending your provider a message by [facility] may be a faster and more efficient way to get a response.  Please allow 48 business hours for a response.  Please remember that this is for non-urgent requests.  _______________________________________________________  Your provider has requested that you go to the basement level for lab work before leaving today. Press "B" on the elevator. The lab is located at the first door on the left as you exit the elevator.  Due to recent changes in healthcare laws, you may see the results of your imaging and laboratory studies on MyChart before your provider has had a chance to review them.  We understand that in some cases there may be results that are confusing or concerning to you. Not all laboratory results come back in the same time frame and the provider may be waiting for multiple results in order to interpret others.  Please give [place] 48 hours in order for your provider to thoroughly review all the results before contacting the office for clarification of your results.   You will be due to follow up in January 2024.  We will contact you to schedule this appointment.   Follow lactose free diet for 2 weeks.  Lactose-Free Diet, Adult If you have lactose intolerance, you are not able to digest lactose. Lactose is a natural sugar found mainly in dairy milk and dairy products. A lactose-free diet can help you avoid foods and beverages that contain lactose. What are tips for following this plan? Reading food  labels Do not consume foods, beverages, vitamins, minerals, or medicines containing lactose. Read ingredient lists carefully. Look for the words "lactose-free" on labels. Meal planning Use alternatives to dairy milk and foods made with milk products. These include the following: Lactose-free milk. Soy milk with added calcium and vitamin D. Almond milk, coconut milk, rice milk, or other nondairy milk alternatives with added calcium and vitamin D. Note that a lot of these are low in protein. Soy products, such as soy yogurt, soy cheese, soy ice cream, and soy-based sour cream. Other nut milk products, such as almond yogurt, almond cheese, cashew yogurt, cashew cheese, cashew ice cream, coconut yogurt, and coconut ice cream. Medicines, vitamins, and supplements Use lactase enzyme drops or tablets as directed by your health care provider. Make sure you get enough calcium and vitamin D in your diet. A lactose-free eating plan can be lacking in these important nutrients. Take calcium and vitamin D supplements as directed by your health care provider. Talk with your health care provider about supplements if you are not able to get enough calcium and vitamin D from food. What foods should I eat?  Fruits All fresh, canned, frozen, or dried fruits and fruit juices that are not processed with lactose. Vegetables All fresh, frozen, and canned vegetables without cheese, cream, or butter sauces. Grains Any that are not made with dairy milk or dairy products. Meats and other proteins Any meat, fish, poultry, and other protein sources that are not made with dairy milk or dairy products. Fats and oils Any that are not  made with dairy milk or dairy products. Sweets and desserts Any that are not made with dairy milk or dairy products. Seasonings and condiments Any that are not made with dairy milk or dairy products. Calcium Calcium is found in many foods that contain lactose and is important for bone  health. The amount of calcium you need depends on your age: Adults younger than 50 years: 1,000 mg of calcium a day. Adults older than 50 years: 1,200 mg of calcium a day. If you are not getting enough calcium, you may get it from other sources, including: Orange juice that has been fortified with calcium. This means that calcium has been added to the product. There are 300-350 mg of calcium in 1 cup (237 mL) of calcium-fortified orange juice. Soy milk fortified with calcium. There are 300-400 mg of calcium in 1 cup (237 mL) of calcium-fortified soy milk. Rice or almond milk fortified with calcium. There are 300 mg of calcium in 1 cup (237 mL) of calcium-fortified rice or almond milk. Breakfast cereals fortified with calcium. There are 100-1,000 mg of calcium in calcium-fortified breakfast cereals. Spinach, cooked. There are 145 mg of calcium in  cup (90 g) of cooked spinach. Edamame, cooked. There are 130 mg of calcium in  cup (47 g) of cooked edamame. Collard greens, cooked. There are 125 mg of calcium in  cup (85 g) of cooked collard greens. Kale, frozen or cooked. There are 90 mg of calcium in  cup (59 g) of cooked or frozen kale. Almonds. There are 95 mg of calcium in  cup (35 g) of almonds. Broccoli, cooked. There are 60 mg of calcium in 1 cup (156 g) of cooked broccoli. The items listed above may not be a complete list of foods and beverages you can eat. Contact a dietitian for more options. What foods should I avoid? Lactose is found in dairy milk and dairy products, such as: Yogurt. Cheese. Butter. Margarine. Sour cream. Cream. Whipped toppings and creamers. Ice cream and other dairy-based desserts. Lactose is also found in foods or products made with dairy milk or milk ingredients. To find out whether a food contains dairy milk or a milk ingredient, look at the ingredients list. Avoid foods with the statement "May contain milk" and foods that contain: Milk  powder. Whey. Curd. Lactose. Lactoglobulin. The items listed above may not be a complete list of foods and beverages to avoid. Contact a dietitian for more information. Where to find more information [employer] of Diabetes and Digestive and Kidney Diseases: [email or web address] Summary If you are lactose intolerant, it means that you are not able to digest lactose, a natural sugar found in milk and milk products. Following a lactose-free diet can help you manage this condition. Calcium is important for bone health and is found in many foods that contain lactose. Talk with your health care provider about other sources of calcium. This information is not intended to replace advice given to you by your health care provider. Make sure you discuss any questions you have with your health care provider. Document Revised: 01/04/2020 Document Reviewed: 01/04/2020 Elsevier Patient Education  2023 [place] you for entrusting me with your care and choosing [facility].  Dr Silverio Decamp

## 2021-11-28 NOTE — Progress Notes (Signed)
Deborah Meadows    [id number]    1965-12-09  Primary Care Physician:Knapp, Tera Helper, MD  Referring Physician: Rita Ohara, [address] [place],  [place] [address]   Chief complaint:  Abdominal pain, bloated  HPI: 56 year old very pleasant female here for follow-up visit with complaints of abdominal bloating and discomfort, worse in the past 8 months.  She also has been experiencing intermittent diarrhea and fecal urgency every few weeks Denies any rectal bleeding or melena. No recent dietary changes or medication changes  Other relevant medical history includes s/p hysterectomy for intramural leiomyoma in 2017 No family history of GI malignancy  Colonoscopy February 2021 - Diverticulosis in the sigmoid colon. - Non-bleeding internal hemorrhoids. - The examination was otherwise normal. - No specimens collected  Colonoscopy January 2018 - One 13 mm polyp in the cecum, removed with a hot snare. Resected and retrieved.  Tubular adenoma - Non-bleeding internal hemorrhoids. - The examination was otherwise normal.  Outpatient Encounter Medications as of 11/28/2021  Medication Sig   Cholecalciferol (VITAMIN D) 50 MCG (2000 UT) tablet Take 4,000 Units by mouth daily.   meclizine (ANTIVERT) 25 MG tablet Take 1 tablet (25 mg total) by mouth 3 (three) times daily as needed for dizziness. (Patient not taking: Reported on 08/02/2021)   triamcinolone cream (KENALOG) 0.1 % APPLY TO ACTIVE RASH TWICE A DAY AS NEEDED (Patient not taking: Reported on 08/02/2021)   vitamin C (ASCORBIC ACID) 500 MG tablet Take 500 mg by mouth daily.   No facility-administered encounter medications on file as of 11/28/2021.    Allergies as of 11/28/2021 - Review Complete 11/22/2021  Allergen Reaction Noted   Penicillins Hives and Itching     Past Medical History:  Diagnosis Date   Anemia    borderline   Blood dyscrasia    had test 18 years ago. No diagnosis   Irregular heart beat     normal eval by Dr. Radford Pax in past   Pancreatic lesion     Past Surgical History:  Procedure Laterality Date   [place] N/A 07/04/2015   Procedure: [place];  Surgeon: Terrance Mass, MD;  Location: [facility];  Service: Gynecology;  Laterality: N/A;   DILATION AND CURETTAGE OF UTERUS     x three   HYSTEROSCOPY WITH NOVASURE N/A 07/04/2015   Procedure: [place] ;  Surgeon: Terrance Mass, MD;  Location: [facility];  Service: Gynecology;  Laterality: N/A;   WISDOM TOOTH EXTRACTION     x2    Family History  Problem Relation Age of Onset   Hypertension Mother    Hyperlipidemia Father    Cancer Sister        thyroid   Hypertension Sister    Heart disease Maternal Grandmother    Alzheimer's disease Paternal Grandmother    Diabetes Neg Hx    Colon cancer Neg Hx    Rectal cancer Neg Hx    Stomach cancer Neg Hx     Social History   Socioeconomic History   Marital status: Married    Spouse name: Not on file   Number of children: Not on file   Years of education: Not on file   Highest education level: Not on file  Occupational History   Occupation: works at [employer]  Tobacco Use   Smoking status: Never  Smokeless tobacco: Never  Vaping Use   Vaping Use: Never used  Substance and Sexual Activity   Alcohol use: Yes    Alcohol/week: 10.0 standard drinks of alcohol    Types: 10 Glasses of wine per week    Comment: 1 glass of wine (7 oz) daily   Drug use: No   Sexual activity: Not Currently    Birth control/protection: Other-see comments    Comment: vasectomy,des neg  Other Topics Concern   Not on file  Social History Narrative   Married, 3 children, 2 dogs.  Oldest is at [employer] (will graduate 01/2022). Middle at [place]. Son is at [employer].   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      Reviewed  11/2021   Social Determinants of Health   Financial Resource Strain: Not on file  Food Insecurity: Not on file  Transportation Needs: Not on file  Physical Activity: Not on file  Stress: Not on file  Social Connections: Not on file  Intimate Partner Violence: Not on file      Review of systems: All other review of systems negative except as mentioned in the HPI.   Physical Exam: Vitals:   11/28/21 0830  BP: 106/70  Pulse: 98   There is no height or weight on file to calculate BMI. Gen:      No acute distress HEENT:  sclera anicteric Abd:      soft, non-tender; no palpable masses, no distension Ext:    No edema Neuro: alert and oriented x 3 Psych: normal mood and affect  Data Reviewed:  Reviewed labs, radiology imaging, old records and pertinent past GI work up   Assessment and Plan/Recommendations:  56 year old very pleasant female with complaints of generalized abdominal bloating, discomfort, intermittent diarrhea and fecal urgency  Check fecal fat, lactoferrin and pancreatic elastase to exclude exocrine pancreatic insufficiency or colitis ESR and CRP Reviewed recent labs done by PMD Trial of lactose-free diet  If has persistent symptoms, will treat with course of Xifaxan for IBS diarrhea and possible small intestinal bacterial overgrowth Will also need to exclude celiac disease Return in 6 to 8 weeks  This visit required 40 minutes of patient care (this includes precharting, chart review, review of results, face-to-face time used for counseling as well as treatment plan and follow-up. The patient was provided an opportunity to ask questions and all were answered. The patient agreed with the plan and demonstrated an understanding of the instructions.  Damaris Hippo , MD    CC: Rita Ohara, MD

## 2021-11-29 LAB — TISSUE TRANSGLUTAMINASE, IGA: (tTG) Ab, IgA: <1 U/mL

## 2021-11-29 LAB — IGA: Immunoglobulin A: 143 mg/dL (ref 47–310)

## 2021-12-05 ENCOUNTER — Other Ambulatory Visit: Payer: BC Managed Care – PPO

## 2021-12-05 DIAGNOSIS — R1084 Generalized abdominal pain: Secondary | ICD-10-CM

## 2021-12-05 DIAGNOSIS — R140 Abdominal distension (gaseous): Secondary | ICD-10-CM

## 2021-12-05 DIAGNOSIS — K580 Irritable bowel syndrome with diarrhea: Secondary | ICD-10-CM | POA: Diagnosis not present

## 2021-12-08 LAB — FECAL FAT, QUALITATIVE
Fat Qual Neutral, Stl: NORMAL
Fat Qual Total, Stl: INCREASED

## 2021-12-12 LAB — FECAL LACTOFERRIN, QUANT
Fecal Lactoferrin: NEGATIVE
MICRO NUMBER:: 14099682
SPECIMEN QUALITY:: ADEQUATE

## 2021-12-12 LAB — PANCREATIC ELASTASE, FECAL: Pancreatic Elastase-1, Stool: >500 mcg/g

## 2021-12-28 ENCOUNTER — Encounter: Payer: Self-pay | Admitting: Gastroenterology

## 2021-12-31 ENCOUNTER — Encounter: Payer: Self-pay | Admitting: Gastroenterology

## 2022-01-07 ENCOUNTER — Other Ambulatory Visit: Payer: Self-pay

## 2022-01-07 ENCOUNTER — Telehealth: Payer: Self-pay | Admitting: Pharmacy Technician

## 2022-01-07 ENCOUNTER — Other Ambulatory Visit (HOSPITAL_COMMUNITY): Payer: Self-pay

## 2022-01-07 MED ORDER — RIFAXIMIN 550 MG PO TABS: 550.0000 mg | ORAL_TABLET | Freq: Three times a day (TID) | ORAL | 0 refills | Status: AC

## 2022-01-07 NOTE — Telephone Encounter (Signed)
Patient Advocate Encounter  Received notification from [facility] that prior authorization for XIFAXAN '550MG'$  is required.   PA submitted on 11.27.23 Key [id number] Status is pending

## 2022-01-07 NOTE — Telephone Encounter (Signed)
Patient Advocate Encounter  Prior Authorization for XIFAXAN '550MG'$  has been approved.    PA# --- Effective dates: 11.27.23 through 11.26.24

## 2022-01-14 ENCOUNTER — Encounter: Payer: Self-pay | Admitting: Nurse Practitioner

## 2022-01-15 ENCOUNTER — Encounter: Payer: Self-pay | Admitting: Nurse Practitioner

## 2022-01-15 ENCOUNTER — Ambulatory Visit: Payer: BC Managed Care – PPO | Admitting: Nurse Practitioner

## 2022-01-15 VITALS — BP 128/80 | HR 79

## 2022-01-15 DIAGNOSIS — N898 Other specified noninflammatory disorders of vagina: Secondary | ICD-10-CM | POA: Diagnosis not present

## 2022-01-15 LAB — WET PREP FOR TRICH, YEAST, CLUE

## 2022-01-15 NOTE — Telephone Encounter (Signed)
Spoke with patient. Patient will make an appt to be treated due to the symptoms she is having

## 2022-01-15 NOTE — Progress Notes (Signed)
   Acute Office Visit  Subjective:    Patient ID: Deborah Meadows, female    DOB: 09-06-1965, 56 y.o.   MRN: [id number]   HPI 56 y.o. presents today for vaginal discharge for a few days. Discharge is white and creamy. Denies itching, irritation or odor. Had 2 episodes of brown discharge. Is seeing GI for abdominal pain, bloating, diarrhea, fecal urgency, and pancreatic lesion. Has appt with them next week. Not sexually active.    Review of Systems  Constitutional: Negative.   Genitourinary:  Positive for vaginal discharge. Negative for dysuria, flank pain, frequency, hematuria, urgency and vaginal pain.       Objective:    Physical Exam Constitutional:      Appearance: Normal appearance.  Genitourinary:    General: Normal vulva.     Vagina: Vaginal discharge present. No erythema.     Cervix: Normal.     BP 128/80   Pulse 79   SpO2 97%  Wt Readings from Last 3 Encounters:  11/28/21 132 lb (59.9 kg)  11/22/21 132 lb (59.9 kg)  08/02/21 127 lb (57.6 kg)        Patient informed chaperone available to be present for breast and/or pelvic exam. Patient has requested no chaperone to be present. Patient has been advised what will be completed during breast and pelvic exam.   Wet prep negative for pathogens  Assessment & Plan:   Problem List Items Addressed This Visit   None Visit Diagnoses     Vaginal discharge    -  Primary   Relevant Orders   WET PREP FOR [place], YEAST, CLUE      Plan: Negative wet prep and exam. Will continue to monitor.      Tamela Gammon DNP, 4:20 PM 01/15/2022

## 2022-01-21 ENCOUNTER — Telehealth: Payer: Self-pay

## 2022-01-21 ENCOUNTER — Encounter: Payer: Self-pay | Admitting: Gastroenterology

## 2022-01-21 ENCOUNTER — Ambulatory Visit: Payer: BC Managed Care – PPO | Admitting: Gastroenterology

## 2022-01-21 VITALS — BP 140/78 | HR 78 | Ht 63.0 in | Wt 135.0 lb

## 2022-01-21 DIAGNOSIS — R1013 Epigastric pain: Secondary | ICD-10-CM | POA: Diagnosis not present

## 2022-01-21 DIAGNOSIS — K638219 Small intestinal bacterial overgrowth, unspecified: Secondary | ICD-10-CM

## 2022-01-21 DIAGNOSIS — K319 Disease of stomach and duodenum, unspecified: Secondary | ICD-10-CM | POA: Diagnosis not present

## 2022-01-21 DIAGNOSIS — R140 Abdominal distension (gaseous): Secondary | ICD-10-CM

## 2022-01-21 DIAGNOSIS — K582 Mixed irritable bowel syndrome: Secondary | ICD-10-CM

## 2022-01-21 NOTE — Progress Notes (Signed)
Deborah Meadows    [id number]    1965-08-01  Primary Care Physician:Knapp, Tera Helper, MD  Referring Physician: Rita Ohara, [address] [place],  [place] [address]   Chief complaint: Abdominal bloating, IBS  HPI:  56 year old very pleasant female here for follow-up visit with complaints of abdominal bloating and discomfort, and IBS symptoms worse in the past year.  She had intermittent diarrhea and fecal urgency but has been more constipated recently   Denies any rectal bleeding or melena. 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 - Non-bleeding internal hemorrhoids. - The examination was otherwise normal. - No specimens collected   Colonoscopy January 2018 - One 13 mm polyp in the cecum, removed with a hot snare. Resected and retrieved.  Tubular adenoma - Non-bleeding internal hemorrhoids. - The examination was otherwise normal.  CT abd & pelvis 07/24/21 1. No acute process identified. 2. 6 mm hypodense likely cystic lesion in the head of the pancreas. Recommend initial follow-up contrast-enhanced MRI abdomen or pancreas protocol CT in 6 months. 3. Colonic diverticulosis. 4. Fibroid uterus.  CT abdomen pelvis September 28, 2021 at [facility] Stable 6 mm cyst in the pancreatic head.  Recommended repeat CT or MRI in 12 months  Outpatient Encounter Medications as of 01/21/2022  Medication Sig   Cholecalciferol (VITAMIN D) 50 MCG (2000 UT) tablet Take  4,000 Units by mouth daily.   meclizine (ANTIVERT) 25 MG tablet Take 1 tablet (25 mg total) by mouth 3 (three) times daily as needed for dizziness.   triamcinolone cream (KENALOG) 0.1 %    vitamin C (ASCORBIC ACID) 500 MG tablet Take 500 mg by mouth daily.   No facility-administered encounter medications on file as of 01/21/2022.    Allergies as of 01/21/2022 - Review Complete 01/21/2022  Allergen Reaction Noted   Penicillins Hives and Itching     Past Medical History:  Diagnosis Date   Anemia    borderline   Blood dyscrasia    had test 18 years ago. No diagnosis   Irregular heart beat    normal eval by Dr. Radford Pax in past   Pancreatic lesion     Past Surgical History:  Procedure Laterality Date   [place] N/A 07/04/2015   Procedure: [place];  Surgeon: Terrance Mass, MD;  Location: [facility];  Service: Gynecology;  Laterality: N/A;   DILATION AND CURETTAGE OF UTERUS     x three   HYSTEROSCOPY WITH NOVASURE N/A 07/04/2015   Procedure: [place] ;  Surgeon: Terrance Mass, MD;  Location: [facility];  Service: Gynecology;  Laterality: N/A;   WISDOM TOOTH EXTRACTION     x2    Family History  Problem Relation Age of Onset   Hypertension Mother    Hyperlipidemia Father    Cancer Sister        thyroid   Hypertension Sister  Heart disease Maternal Grandmother    Alzheimer's disease Paternal Grandmother    Diabetes Neg Hx    Colon cancer Neg Hx    Rectal cancer Neg Hx    Stomach cancer Neg Hx     Social History   Socioeconomic History   Marital status: Married    Spouse name: Not on file   Number of children: Not on file   Years of education: Not on file   Highest education level: Not on file  Occupational History   Occupation: works at [employer]  Tobacco Use   Smoking status: Never   Smokeless tobacco: Never  Vaping Use   Vaping  Use: Never used  Substance and Sexual Activity   Alcohol use: Yes    Alcohol/week: 10.0 standard drinks of alcohol    Types: 10 Glasses of wine per week    Comment: 1 glass of wine (7 oz) daily   Drug use: No   Sexual activity: Not Currently    Birth control/protection: Other-see comments    Comment: vasectomy,des neg  Other Topics Concern   Not on file  Social History Narrative   Married, 3 children, 2 dogs.  Oldest is at [employer] (will graduate 01/2022). Middle at [place]. Son is at [employer].   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      Reviewed 11/2021   Social Determinants of Health   Financial Resource Strain: Not on file  Food Insecurity: Not on file  Transportation Needs: Not on file  Physical Activity: Not on file  Stress: Not on file  Social Connections: Not on file  Intimate Partner Violence: Not on file      Review of systems: All other review of systems negative except as mentioned in the HPI.   Physical Exam: Vitals:   01/21/22 1058  BP: (Abnormal) 140/78  Pulse: 78  SpO2: 98%   Body mass index is 23.91 kg/m. Gen:      No acute distress HEENT:  sclera anicteric Cardiovascular: S1-S2 regular with no murmur Lungs: Bilateral clear Abd:      soft, non-tender; no palpable masses, mild distension Ext:    No edema Neuro: alert and oriented x 3 Psych: normal mood and affect  Data Reviewed:  Reviewed labs, radiology imaging, old records and pertinent past GI work up   Assessment and Plan/Recommendations:  56 year old very pleasant female with complaints of generalized abdominal bloating, discomfort, IBS with diarrhea and constipation  Workup so far negative for inflammatory bowel disease, celiac disease, colitis or exocrine pancreatic insufficiency  Plan to empirically treat with course of rifaximin 550 mg 3 times daily for 10 days for small intestinal bacterial overgrowth   She is experiencing dyspepsia and  epigastric discomfort, will check H. pylori stool antigen with Diatherix Scheduled for EGD to evaluate for possible peptic ulcer, erosive esophagitis or gastroduodenitis  Pancreatic head cyst: She is scheduled for follow-up CT in February 2024, will consider switching it to MRI if patient is willing, will need premedication for severe claustrophobia    This visit required 40 minutes of patient care (this includes precharting, chart review, review of results, face-to-face time used for counseling as well as treatment plan and follow-up. The patient was provided an opportunity to ask questions and all were answered. The patient agreed with the plan and demonstrated an understanding of the instructions.  Damaris Hippo , MD    CC: Rita Ohara, MD

## 2022-01-21 NOTE — Patient Instructions (Addendum)
If you are age 58 or older, your body mass index should be between 23-30. Your Body mass index is 23.91 kg/m. If this is out of the aforementioned range listed, please consider follow up with your Primary Care Provider.  If you are age 73 or younger, your body mass index should be between 19-25. Your Body mass index is 23.91 kg/m. If this is out of the aformentioned range listed, please consider follow up with your Primary Care Provider.   ________________________________________________________   Dennis Bast have been scheduled for an endoscopy. Please follow written instructions given to you at your visit today. If you use inhalers (even only as needed), please bring them with you on the day of your procedure.  Your provider has ordered "[facility]atherix" stool testing for you. You have received a kit from our office today containing all necessary supplies to complete this test. Please carefully read the stool collection instructions provided in the kit before opening the accompanying materials. In addition, be sure to place the label from the top left corner of the laboratory request sheet onto the "puritan opti-swab" tube that is supplied in the kit. This label should include your full name and date of birth. After completing the test, you should secure the purtian tube into the specimen biohazard bag. The laboratory request information sheet (including date and time of specimen collection) should be placed into the outside pocket of the specimen biohazard bag and returned to the [facility] lab with 2 days of collection.    Hold the Xifaxan until after you have submitted the stool sample to the lab.  You have been scheduled for a follow up appointment on Friday, 04-12-22 at 9:50am. Please arrive 10 minutes early for registration.    I appreciate the opportunity to care for you. Thank you for choosing me and [facility],  Dr. Harl Bowie

## 2022-01-21 NOTE — Telephone Encounter (Signed)
Called [facility] at [phone], ext [phone].  Requested they power share the imaging of the CT Abdomen W WO done on 09-28-21.  They indicted they would process the request and we should be able to see it within 30 minutes.

## 2022-01-28 ENCOUNTER — Other Ambulatory Visit: Payer: BC Managed Care – PPO

## 2022-01-28 DIAGNOSIS — K582 Mixed irritable bowel syndrome: Secondary | ICD-10-CM | POA: Diagnosis not present

## 2022-01-28 DIAGNOSIS — R1310 Dysphagia, unspecified: Secondary | ICD-10-CM | POA: Diagnosis not present

## 2022-02-20 ENCOUNTER — Encounter: Payer: Self-pay | Admitting: Gastroenterology

## 2022-02-20 ENCOUNTER — Ambulatory Visit (AMBULATORY_SURGERY_CENTER): Payer: BC Managed Care – PPO | Admitting: Gastroenterology

## 2022-02-20 VITALS — BP 124/67 | HR 73 | Temp 97.8°F | Resp 25 | Ht 63.0 in | Wt 135.0 lb

## 2022-02-20 DIAGNOSIS — R1013 Epigastric pain: Secondary | ICD-10-CM

## 2022-02-20 DIAGNOSIS — K253 Acute gastric ulcer without hemorrhage or perforation: Secondary | ICD-10-CM | POA: Diagnosis not present

## 2022-02-20 DIAGNOSIS — K259 Gastric ulcer, unspecified as acute or chronic, without hemorrhage or perforation: Secondary | ICD-10-CM | POA: Diagnosis not present

## 2022-02-20 DIAGNOSIS — K2970 Gastritis, unspecified, without bleeding: Secondary | ICD-10-CM

## 2022-02-20 DIAGNOSIS — R140 Abdominal distension (gaseous): Secondary | ICD-10-CM

## 2022-02-20 DIAGNOSIS — G8929 Other chronic pain: Secondary | ICD-10-CM

## 2022-02-20 DIAGNOSIS — K222 Esophageal obstruction: Secondary | ICD-10-CM

## 2022-02-20 DIAGNOSIS — K2100 Gastro-esophageal reflux disease with esophagitis, without bleeding: Secondary | ICD-10-CM

## 2022-02-20 DIAGNOSIS — K319 Disease of stomach and duodenum, unspecified: Secondary | ICD-10-CM | POA: Diagnosis not present

## 2022-02-20 DIAGNOSIS — K209 Esophagitis, unspecified without bleeding: Secondary | ICD-10-CM

## 2022-02-20 MED ORDER — SODIUM CHLORIDE 0.9 % IV SOLN: 500.0000 mL | Freq: Once | INTRAVENOUS | Status: DC

## 2022-02-20 MED ORDER — OMEPRAZOLE 40 MG PO CPDR: 40.0000 mg | DELAYED_RELEASE_CAPSULE | Freq: Every day | ORAL | 0 refills | Status: DC

## 2022-02-20 MED ORDER — SUCRALFATE 1 G PO TABS: 1.0000 g | ORAL_TABLET | Freq: Two times a day (BID) | ORAL | 0 refills | Status: DC

## 2022-02-20 NOTE — Progress Notes (Unsigned)
Pt's states no medical or surgical changes since previsit or office visit. 

## 2022-02-20 NOTE — Progress Notes (Signed)
Called to room to assist during endoscopic procedure.  Patient ID and intended procedure confirmed with present staff. Received instructions for my participation in the procedure from the performing physician.  

## 2022-02-20 NOTE — Progress Notes (Unsigned)
Please refer to office visit note 01/21/22. No additional changes in H&P Patient is appropriate for planned procedure(s) and anesthesia in an ambulatory setting  K. Denzil Magnuson , MD [phone]

## 2022-02-20 NOTE — Op Note (Signed)
[facility] Patient Name: Deborah Meadows Procedure Date: 02/20/2022 10:17 AM MRN: [id number] Endoscopist: Mauri Pole , MD, [id number] Age: 57 Referring MD:  Date of Birth: 1965/09/03 Gender: Female Account #: [id number] Procedure:                Upper GI endoscopy Indications:              Epigastric abdominal pain, Dyspepsia Medicines:                Monitored Anesthesia Care Procedure:                Pre-Anesthesia Assessment:                           - Prior to the procedure, a History and Physical                            was performed, and patient medications and                            allergies were reviewed. The patient's tolerance of                            previous anesthesia was also reviewed. The risks                            and benefits of the procedure and the sedation                            options and risks were discussed with the patient.                            All questions were answered, and informed consent                            was obtained. Prior Anticoagulants: The patient has                            taken no anticoagulant or antiplatelet agents. ASA                            Grade Assessment: II - A patient with mild systemic                            disease. After reviewing the risks and benefits,                            the patient was deemed in satisfactory condition to                            undergo the procedure.                           After obtaining informed consent, the endoscope was  passed under direct vision. Throughout the                            procedure, the patient's blood pressure, pulse, and                            oxygen saturations were monitored continuously. The                            Endoscope Olympus [id number] was introduced through                            the mouth, and advanced to the second part of                            duodenum. The  upper GI endoscopy was accomplished                            without difficulty. The patient tolerated the                            procedure well. Scope In: Scope Out: Findings:                 LA Grade B (one or more mucosal breaks greater than                            5 mm, not extending between the tops of two mucosal                            folds) esophagitis with no bleeding was found 35 to                            36 cm from the incisors.                           One benign-appearing, intrinsic mild stenosis was                            found 34 to 35 cm from the incisors. This stenosis                            measured 1.8 cm (inner diameter) x less than one cm                            (in length). The stenosis was traversed.                           The exam of the esophagus was otherwise normal.                           A 2 cm hiatal hernia was present.  One non-bleeding cratered gastric ulcer with no                            stigmata of bleeding was found in the prepyloric                            region of the stomach. The lesion was 3 mm in                            largest dimension. Biopsies were taken with a cold                            forceps for histology.                           Patchy mild inflammation characterized by                            congestion (edema), erythema and friability was                            found in the entire examined stomach. Biopsies were                            taken with a cold forceps for Helicobacter pylori                            testing.                           The cardia and gastric fundus were normal on                            retroflexion.                           The examined duodenum was normal. Complications:            No immediate complications. Estimated Blood Loss:     Estimated blood loss was minimal. Impression:               - LA Grade B reflux  esophagitis with no bleeding.                           - Benign-appearing esophageal stenosis.                           - 2 cm hiatal hernia.                           - Non-bleeding gastric ulcer with no stigmata of                            bleeding. Biopsied.                           -  Gastritis. Biopsied.                           - Normal examined duodenum. Recommendation:           - Patient has a contact number available for                            emergencies. The signs and symptoms of potential                            delayed complications were discussed with the                            patient. Return to normal activities tomorrow.                            Written discharge instructions were provided to the                            patient.                           - Resume previous diet.                           - Continue present medications.                           - Await pathology results.                           - No aspirin, ibuprofen, naproxen, or other                            non-steroidal anti-inflammatory drugs.                           - Follow an antireflux regimen.                           - Use Prilosec (omeprazole) 40 mg PO daily for 3                            months.                           - Use sucralfate tablets 1 gram PO BID for 2 weeks. Mauri Pole, MD 02/20/2022 10:47:54 AM This report has been signed electronically.

## 2022-02-20 NOTE — Patient Instructions (Addendum)
Handouts on esophagitis, gastritis, and hiatal hernia given to patient. Await pathology results. No aspirin, ibuprofen, naproxen, or other non-steroidal anti-inflammatory medications (NSAID's) Follow and antireflux regimen Use Prilosec (omeprazole) 40 mg daily for 3 months and Use sucralfate tablets 1 gram twice a day for 2 weeks - pick up from [facility]:   Refer to the procedure report that was given to you for any specific questions about what was found during the examination.  If the procedure report does not answer your questions, please call your gastroenterologist to clarify.  If you requested that your care partner not be given the details of your procedure findings, then the procedure report has been included in a sealed envelope for you to review at your convenience later.  YOU SHOULD EXPECT: Some feelings of bloating in the abdomen. Passage of more gas than usual.  Walking can help get rid of the air that was put into your GI tract during the procedure and reduce the bloating. If you had a lower endoscopy (such as a colonoscopy or flexible sigmoidoscopy) you may notice spotting of blood in your stool or on the toilet paper. If you underwent a bowel prep for your procedure, you may not have a normal bowel movement for a few days.  Please Note:  You might notice some irritation and congestion in your nose or some drainage.  This is from the oxygen used during your procedure.  There is no need for concern and it should clear up in a day or so.  SYMPTOMS TO REPORT IMMEDIATELY:  Following upper endoscopy (EGD)  Vomiting of blood or coffee ground material  New chest pain or pain under the shoulder blades  Painful or persistently difficult swallowing  New shortness of breath  Fever of 100F or higher  Black, tarry-looking stools  For urgent or emergent issues, a gastroenterologist can be reached at any hour by calling  [phone]. Do not use MyChart messaging for urgent concerns.    DIET:  We do recommend a small meal at first, but then you may proceed to your regular diet.  Drink plenty of fluids but you should avoid alcoholic beverages for 24 hours.  ACTIVITY:  You should plan to take it easy for the rest of today and you should NOT DRIVE or use heavy machinery until tomorrow (because of the sedation medicines used during the test).    FOLLOW UP: Our staff will call the number listed on your records the next business day following your procedure.  We will call around 7:15- 8:00 am to check on you and address any questions or concerns that you may have regarding the information given to you following your procedure. If we do not reach you, we will leave a message.     If any biopsies were taken you will be contacted by phone or by letter within the next 1-3 weeks.  Please call us at [phone] if you have not heard about the biopsies in 3 weeks.    SIGNATURES/CONFIDENTIALITY: You and/or your care partner have signed paperwork which will be entered into your electronic medical record.  These signatures attest to the fact that that the information above on your After Visit Summary has been reviewed and is understood.  Full responsibility of the confidentiality of this discharge information lies with you and/or your care-partner.

## 2022-02-20 NOTE — Progress Notes (Unsigned)
Report to pacu rn. Vss. Care resumed by rn. 

## 2022-02-21 ENCOUNTER — Telehealth: Payer: Self-pay

## 2022-02-21 ENCOUNTER — Encounter: Payer: Self-pay | Admitting: Gastroenterology

## 2022-02-21 NOTE — Telephone Encounter (Signed)
  Follow up Call-     02/20/2022    9:13 AM  Call back number  Post procedure Call Back phone  # [phone]  Permission to leave phone message Yes     Patient questions:  Do you have a fever, pain , or abdominal swelling? No. Pain Score  0 *  Have you tolerated food without any problems? Yes.    Have you been able to return to your normal activities? Yes.    Do you have any questions about your discharge instructions: Diet   No. Medications  No. Follow up visit  No.  Do you have questions or concerns about your Care? No.  Actions: * If pain score is 4 or above: No action needed, pain <4.

## 2022-02-28 ENCOUNTER — Encounter: Payer: Self-pay | Admitting: Family Medicine

## 2022-02-28 ENCOUNTER — Encounter: Payer: Self-pay | Admitting: Gastroenterology

## 2022-03-19 ENCOUNTER — Ambulatory Visit: Payer: BC Managed Care – PPO | Admitting: Gastroenterology

## 2022-04-12 ENCOUNTER — Encounter: Payer: Self-pay | Admitting: Gastroenterology

## 2022-04-12 ENCOUNTER — Ambulatory Visit: Payer: BC Managed Care – PPO | Admitting: Gastroenterology

## 2022-04-12 VITALS — BP 124/66 | HR 79 | Ht 63.0 in | Wt 137.0 lb

## 2022-04-12 DIAGNOSIS — K253 Acute gastric ulcer without hemorrhage or perforation: Secondary | ICD-10-CM | POA: Diagnosis not present

## 2022-04-12 DIAGNOSIS — K222 Esophageal obstruction: Secondary | ICD-10-CM

## 2022-04-12 DIAGNOSIS — K2970 Gastritis, unspecified, without bleeding: Secondary | ICD-10-CM | POA: Diagnosis not present

## 2022-04-12 DIAGNOSIS — R1013 Epigastric pain: Secondary | ICD-10-CM

## 2022-04-12 DIAGNOSIS — K209 Esophagitis, unspecified without bleeding: Secondary | ICD-10-CM

## 2022-04-12 DIAGNOSIS — K2990 Gastroduodenitis, unspecified, without bleeding: Secondary | ICD-10-CM

## 2022-04-12 MED ORDER — OMEPRAZOLE 40 MG PO CPDR: 40.0000 mg | DELAYED_RELEASE_CAPSULE | Freq: Every day | ORAL | 0 refills | Status: DC

## 2022-04-12 NOTE — Progress Notes (Signed)
Deborah Meadows    [id number]    September 02, 1965  Primary Care Physician:Knapp, Tera Helper, MD  Referring Physician: Rita Ohara, [place],  [place] [address]   Chief complaint: Abdominal bloating, IBS Chief Complaint  Patient presents with   Abdominal Pain    Patient states she feels the same as before. Patient still reports abdominal bloating and generalized abd pain.     HPI:  57 year old very pleasant female here for follow-up visit with complaints of abdominal bloating and discomfort, and IBS symptoms worse in the past year. Other relevant medical history includes s/p hysterectomy for intramural leiomyoma in 2017 No family history of GI malignancy  I last saw her on 01/21/2022. At that time, she had intermittent diarrhea and fecal urgency but has been more constipated    Today, she reports feeling unchanged since her last visit. She reports feeling discomfort in her upper abdominal area but denies pain. She states she started taking Carafate more recently and has few more days last, typically taking it an hour before breakfast.and then takes Prilosec 20 mg typically taking it 3 hours after her breakfast.  She was prescribed omeprazole 40 mg daily in January postprocedure, there was some confusion with pharmacy and never picked it up.  Her bowel habits are regular, denies any rectal bleeding or melena.  No vomiting.  She continues to have generalized abdominal bloating.    GI Hx:  TTG IgA antibody negative for celiac disease Fecal elastase normal Fecal lactoferrin negative Normal fecal fat   EGD 02/20/2022 - [place] Grade B reflux esophagitis with no bleeding.  - Benign-appearing esophageal stenosis - 2 cm hiatal hernia.  - Non-bleeding gastric ulcer with no stigmata of bleeding. Biopsied.  - Gastritis. Biopsied.  - Normal examined duodenum  Pathology 02/20/2022 1. Surgical [P], gastric antrum and gastric body bx - ANTRAL AND OXYNTIC TYPE MUCOSAE  WITH REACTIVE (CHEMICAL) GASTROPATHY - NEGATIVE FOR HELICOBACTER ORGANISMS ON H&E STAIN - NEGATIVE FOR DYSPLASIA OR MALIGNANCY 2. Surgical [P], gastric ulcer bx - ANTRAL MUCOSA WITH MARKED REACTIVE (CHEMICAL) GASTROPATHY AND FOCAL EROSION - IMMUNOHISTOCHEMICAL STAIN FOR HELICOBACTER ORGANISMS IS NEGATIVE - NEGATIVE FOR DYSPLASIA OR MALIGNANCY  Colonoscopy February 2021 - Diverticulosis in the sigmoid colon. - Non-bleeding internal hemorrhoids. - The examination was otherwise normal. - No specimens collected   Colonoscopy January 2018 - One 13 mm polyp in the cecum, removed with a hot snare. Resected and retrieved.  Tubular adenoma - Non-bleeding internal hemorrhoids. - The examination was otherwise normal.  CT abd & pelvis 07/24/21 1. No acute process identified. 2. 6 mm hypodense likely cystic lesion in the head of the pancreas. Recommend initial follow-up contrast-enhanced MRI abdomen or pancreas protocol CT in 6 months. 3. Colonic diverticulosis. 4. Fibroid uterus.  CT abdomen pelvis September 28, 2021 at [facility] Stable 6 mm cyst in the pancreatic head.  Recommended repeat CT or MRI in 12 months     Review of Systems  Gastrointestinal:  Positive for abdominal distention.      Current Outpatient Medications:    Cholecalciferol (VITAMIN D) 50 MCG (2000 UT) tablet, Take 4,000 Units by mouth daily., Disp: , Rfl:    meclizine (ANTIVERT) 25 MG tablet, Take 1 tablet (25 mg total) by mouth 3 (three) times daily as needed for dizziness., Disp: 30 tablet, Rfl: 0   triamcinolone cream (KENALOG) 0.1 %, , Disp: , Rfl:    vitamin C (ASCORBIC ACID) 500 MG  tablet, Take 500 mg by mouth daily., Disp: , Rfl:    omeprazole (PRILOSEC) 40 MG capsule, Take 1 capsule (40 mg total) by mouth daily. 30 - 60 minutes before breakfast, Disp: 90 capsule, Rfl: 0   sucralfate (CARAFATE) 1 g tablet, Take 1 tablet (1 g total) by mouth 2 (two) times daily for 14 days., Disp: 28 tablet, Rfl:  0    Allergies as of 04/12/2022 - Review Complete 04/12/2022  Allergen Reaction Noted   Penicillins Hives and Itching     Past Medical History:  Diagnosis Date   Allergy    Anemia    borderline   Blood dyscrasia    had test 18 years ago. No diagnosis   Hyperlipidemia    no meds   Irregular heart beat    normal eval by Dr. Radford Pax in past   Pancreatic lesion    Thyroid nodule     Past Surgical History:  Procedure Laterality Date   [place] N/A 07/04/2015   Procedure: [place];  Surgeon: Terrance Mass, MD;  Location: [facility];  Service: Gynecology;  Laterality: N/A;   DILATION AND CURETTAGE OF UTERUS     x three   HYSTEROSCOPY WITH NOVASURE N/A 07/04/2015   Procedure: [place] ;  Surgeon: Terrance Mass, MD;  Location: [facility];  Service: Gynecology;  Laterality: N/A;   WISDOM TOOTH EXTRACTION     x2    Family History  Problem Relation Age of Onset   Hypertension Mother    Hyperlipidemia Father    Cancer Sister        thyroid   Hypertension Sister    Heart disease Maternal Grandmother    Alzheimer's disease Paternal Grandmother    Diabetes Neg Hx    Colon cancer Neg Hx    Rectal cancer Neg Hx    Stomach cancer Neg Hx    Esophageal cancer Neg Hx     Social History   Socioeconomic History   Marital status: Married    Spouse name: Not on file   Number of children: Not on file   Years of education: Not on file   Highest education level: Not on file  Occupational History   Occupation: works at [employer]  Tobacco Use   Smoking status: Never   Smokeless tobacco: Never  Vaping Use   Vaping Use: Never used  Substance and Sexual Activity   Alcohol use: Yes    Alcohol/week: 10.0 standard drinks of alcohol    Types: 10 Glasses of wine per week    Comment: 1 glass of wine (7 oz) daily   Drug use: No   Sexual activity: Not  Currently    Birth control/protection: Other-see comments    Comment: vasectomy,des neg  Other Topics Concern   Not on file  Social History Narrative   Married, 3 children, 2 dogs.  Oldest is at [employer] (will graduate 01/2022). Middle at [place]. Son is at [employer].   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      Reviewed 11/2021   Social Determinants of Health   Financial Resource Strain: Not on file  Food Insecurity: Not on file  Transportation Needs: Not on file  Physical Activity: Not on file  Stress: Not on file  Social Connections: Not on file  Intimate Partner Violence: Not on file  Review of systems: Review of Systems  Gastrointestinal:  Positive for abdominal distention.      Physical Exam: General: well-appearing  Eyes: sclera anicteric, no redness ENT: oral mucosa moist without lesions, no cervical or supraclavicular lymphadenopathy CV: RRR, no JVD, no peripheral edema Resp: clear to auscultation bilaterally, normal RR and effort noted GI: soft, epigastric tenderness, with active bowel sounds. No guarding or palpable organomegaly noted. Mild bloating Skin; warm and dry, no rash or jaundice noted Neuro: awake, alert and oriented x 3. Normal gross motor function and fluent speech   Data Reviewed:  Reviewed labs, radiology imaging, old records and pertinent past GI work up   Assessment and Plan/Recommendations:  57 year old very pleasant female with complaints of generalized abdominal bloating, discomfort, IBS with diarrhea and constipation  Workup so far negative for inflammatory bowel disease, celiac disease, colitis or exocrine pancreatic insufficiency  She had erosive gastritis and gastric ulcer on EGD in January 2024.  Biopsies negative for H. pylori, dysplasia or intestinal metaplasia. 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 The patient was provided an opportunity to ask questions and all were answered. The patient agreed with the plan and demonstrated an understanding of the instructions.  Damaris Hippo , MD    CC: Rita Ohara, MD

## 2022-04-12 NOTE — Patient Instructions (Addendum)
We have sent the following medications to your pharmacy for you to pick up at your convenience:  Omeprazole  _______________________________________________________  If your blood pressure at your visit was 140/90 or greater, please contact your primary care physician to follow up on this.  _______________________________________________________  If you are age 59 or older, your body mass index should be between 23-30. Your Body mass index is 24.27 kg/m. If this is out of the aforementioned range listed, please consider follow up with your Primary Care Provider.  If you are age 28 or younger, your body mass index should be between 19-25. Your Body mass index is 24.27 kg/m. If this is out of the aformentioned range listed, please consider follow up with your Primary Care Provider.   ________________________________________________________  The [facility] providers would like to encourage you to use [facility] to communicate with providers for non-urgent requests or questions.  Due to long hold times on the telephone, sending your provider a message by [facility] may be a faster and more efficient way to get a response.  Please allow 48 business hours for a response.  Please remember that this is for non-urgent requests.  _______________________________________________________   Thank you for choosing [facility]  Harl Bowie, MD

## 2022-04-18 ENCOUNTER — Telehealth: Payer: Self-pay | Admitting: Gastroenterology

## 2022-04-18 NOTE — Telephone Encounter (Signed)
FYI Dr Silverio Decamp see peer to peer request

## 2022-04-18 NOTE — Telephone Encounter (Signed)
[facility] is calling to advise they need a peer to peer  for the following patient for the CT abdomen scan with contrast. The case # [id number]; cpt code 74170. They are scheduled for 04/22/2022.

## 2022-04-19 DIAGNOSIS — M25572 Pain in left ankle and joints of left foot: Secondary | ICD-10-CM | POA: Diagnosis not present

## 2022-04-19 NOTE — Telephone Encounter (Signed)
No response, automated msg that call cannot be completed, will try again on Monday 3/11

## 2022-04-19 NOTE — Telephone Encounter (Signed)
[phone] phone number for peer to peer

## 2022-04-19 NOTE — Telephone Encounter (Signed)
[facility] is calling to advise they need a peer to peer  for the following patient for the CT abdomen scan with contrast. The case # [id number]; cpt code 74170. They are scheduled for 04/22/2022.  

## 2022-04-19 NOTE — Telephone Encounter (Signed)
I can do a peer to peer, continue please  request them to send the number to call and procedure.  Thank you

## 2022-04-23 NOTE — Telephone Encounter (Signed)
The listed phone number for appeal doesn't work.

## 2022-05-01 ENCOUNTER — Other Ambulatory Visit: Payer: Self-pay | Admitting: Gastroenterology

## 2022-05-01 DIAGNOSIS — K253 Acute gastric ulcer without hemorrhage or perforation: Secondary | ICD-10-CM

## 2022-05-01 DIAGNOSIS — K222 Esophageal obstruction: Secondary | ICD-10-CM

## 2022-05-03 NOTE — Telephone Encounter (Signed)
The number provided on the denial form is [phone]. I called the number and it is correct.  Case # [id number] Cpt # [id number]  Thank you

## 2022-05-06 ENCOUNTER — Other Ambulatory Visit: Payer: Self-pay | Admitting: Family Medicine

## 2022-05-06 DIAGNOSIS — Z1231 Encounter for screening mammogram for malignant neoplasm of breast: Secondary | ICD-10-CM

## 2022-05-07 NOTE — Telephone Encounter (Signed)
Please see the message below the telephone number I was provided previously is different and doesn't work

## 2022-05-07 NOTE — Telephone Encounter (Signed)
Once a decision is made, would you please let me know? I have faxed the documentation.

## 2022-05-07 NOTE — Telephone Encounter (Signed)
I called the correct listed number and was informed that it was denied because its less than 12 months from previous CT , the person I was talking to was non clinical and she will forward to the appeals provider and I requested them to start appeals process ASAP.   Not sure why this was not done by anyone in the our office and I had to make this call as this was not a peer a peer  CT abd & pelvis that was denied was ordered under [place].  Please fax CT report, and progress notes to  [id number] with attn to appeals by May 09, 2022. Thanks

## 2022-06-04 ENCOUNTER — Ambulatory Visit
Admission: RE | Admit: 2022-06-04 | Discharge: 2022-06-04 | Disposition: A | Discharge status: Home / Self Care | Payer: BC Managed Care – PPO | Source: Ambulatory Visit | Attending: Family Medicine | Admitting: Family Medicine

## 2022-06-04 DIAGNOSIS — Z1231 Encounter for screening mammogram for malignant neoplasm of breast: Secondary | ICD-10-CM | POA: Diagnosis not present

## 2022-06-28 ENCOUNTER — Other Ambulatory Visit: Payer: Self-pay

## 2022-06-28 ENCOUNTER — Telehealth: Payer: Self-pay

## 2022-06-28 DIAGNOSIS — K869 Disease of pancreas, unspecified: Secondary | ICD-10-CM

## 2022-06-28 NOTE — Telephone Encounter (Signed)
Deborah Meadows, Deborah Meadows; [name] hour ago (10:58 AM)   JS Approved, Auth#: [id number]       5/17-6/15 @ [facility]    Patient notified. New order printed and faxed to [facility] at [phone].

## 2022-07-11 ENCOUNTER — Other Ambulatory Visit: Payer: Self-pay

## 2022-07-11 ENCOUNTER — Telehealth: Payer: Self-pay

## 2022-07-11 DIAGNOSIS — K869 Disease of pancreas, unspecified: Secondary | ICD-10-CM

## 2022-07-11 DIAGNOSIS — R1013 Epigastric pain: Secondary | ICD-10-CM

## 2022-07-11 NOTE — Telephone Encounter (Signed)
Patient says the CT is to be with and without contrast. Is that what you want ordered for follow up of her pancreatic cyst?

## 2022-07-12 NOTE — Telephone Encounter (Signed)
CT abd/pelvis w/wo contrast ordered. Patient states she has the authorization letter from her insurance.

## 2022-07-16 ENCOUNTER — Telehealth: Payer: Self-pay | Admitting: Gastroenterology

## 2022-07-16 NOTE — Telephone Encounter (Signed)
Attempted to contact Marcelino Duster at [facility] multiple times with no answer and no possibility to leave a vm. Regarding CT authorization.  Auth#: [id number]        5/17-6/15

## 2022-07-17 ENCOUNTER — Other Ambulatory Visit: Payer: Self-pay

## 2022-07-17 NOTE — Telephone Encounter (Signed)
Spoke with the patient. Trying to confirm the CT she is scheduled for is authorized. Confirmed with [facility] the patient is scheduled 07/18/22 for CT of the abdomen and pelvis with and without contrast. Confirmed with our insurance/referral coordinator the imaging that is order is authorized. Letter is in the media section of EMR. The patient was given the number [id number] which is the NPI of the facility. Gave this information to the patient. Faxed the letter of the authorization to [facility]. The letter does not say CT of abdomen and Pelvis with and without contrast. It says CT of the abdomen with and without contrast.

## 2022-07-17 NOTE — Telephone Encounter (Signed)
Patient is calling very irritated that her CT has yet to be scheduled and is requesting a call from [facility] asap. Please advise

## 2022-07-18 DIAGNOSIS — N3289 Other specified disorders of bladder: Secondary | ICD-10-CM | POA: Diagnosis not present

## 2022-07-18 DIAGNOSIS — D259 Leiomyoma of uterus, unspecified: Secondary | ICD-10-CM | POA: Diagnosis not present

## 2022-07-18 DIAGNOSIS — K869 Disease of pancreas, unspecified: Secondary | ICD-10-CM | POA: Diagnosis not present

## 2022-07-18 NOTE — Telephone Encounter (Signed)
Faxed authorization information to [facility] attn Diannia Ruder [phone]

## 2022-07-23 ENCOUNTER — Ambulatory Visit: Payer: BC Managed Care – PPO | Admitting: Gastroenterology

## 2022-07-23 ENCOUNTER — Encounter: Payer: Self-pay | Admitting: Gastroenterology

## 2022-07-23 VITALS — BP 112/68 | HR 88 | Ht 63.0 in | Wt 131.0 lb

## 2022-07-23 DIAGNOSIS — R140 Abdominal distension (gaseous): Secondary | ICD-10-CM

## 2022-07-23 DIAGNOSIS — K862 Cyst of pancreas: Secondary | ICD-10-CM | POA: Diagnosis not present

## 2022-07-23 DIAGNOSIS — K582 Mixed irritable bowel syndrome: Secondary | ICD-10-CM | POA: Diagnosis not present

## 2022-07-23 NOTE — Progress Notes (Unsigned)
Deborah Meadows    [id number]    1965-12-11  Primary Care Physician:Knapp, Clarene Critchley, MD  Referring Physician: Joselyn Arrow, MD [address] [place],  [place] [address]   Chief complaint: Abdominal bloating, distention  HPI:  57 year old very pleasant female here for follow-up visit with complaints of abdominal bloating and discomfort, and IBS symptoms worse in the past year. Other relevant medical history includes s/p hysterectomy for intramural leiomyoma in 2017 No family history of GI malignancy   I last saw her on April 12 2022   Today, she reports feeling unchanged since her last visit. She reports feeling discomfort in her upper abdominal area but denies pain.  She has significant abdominal bloating and distention, some days she feels like she is 12 months pregnant.  She is gaining weight.  Her bowel habits are regular, denies any rectal bleeding or melena.  No vomiting.  She continues to have generalized abdominal bloating.    She had CT abdomen pelvis with and without contrast [facility] on July 18, 2022 1.  Nonspecific 7 mm cystic lesion of the head of the pancreas not significantly changed from previous exams dating back to June 2023. Recommendations vary for follow-up although follow-up for a total of 10 years is often recommended. Additional follow-up exams possibly with contrasted MRI could be obtained in 2 years, 5 years and 9 years as indicated.  2.  Shotty nonspecific lymph nodes of the mesentery and retroperitoneum possibly reactive in nature.  3.  Mild wall thickening and stranding of the bladder possibly due to incomplete distention although acute or chronic cystitis could have such an appearance.  4.  Fibroid uterus.    GI Hx:   TTG IgA antibody negative for celiac disease Fecal elastase normal Fecal lactoferrin negative Normal fecal fat   EGD 02/20/2022 - LA Grade B reflux esophagitis with no bleeding.  - Benign-appearing esophageal stenosis - 2 cm  hiatal hernia.  - Non-bleeding gastric ulcer with no stigmata of bleeding. Biopsied.  - Gastritis. Biopsied.  - Normal examined duodenum   Pathology 02/20/2022 1. Surgical [P], gastric antrum and gastric body bx - ANTRAL AND OXYNTIC TYPE MUCOSAE WITH REACTIVE (CHEMICAL) GASTROPATHY - NEGATIVE FOR HELICOBACTER ORGANISMS ON H&E STAIN - NEGATIVE FOR DYSPLASIA OR MALIGNANCY 2. Surgical [P], gastric ulcer bx - ANTRAL MUCOSA WITH MARKED REACTIVE (CHEMICAL) GASTROPATHY AND FOCAL EROSION - IMMUNOHISTOCHEMICAL STAIN FOR HELICOBACTER ORGANISMS IS NEGATIVE - NEGATIVE FOR DYSPLASIA OR MALIGNANCY   Colonoscopy February 2021 - Diverticulosis in the sigmoid colon. - Non-bleeding internal hemorrhoids. - The examination was otherwise normal. - No specimens collected   Colonoscopy January 2018 - One 13 mm polyp in the cecum, removed with a hot snare. Resected and retrieved.  Tubular adenoma - Non-bleeding internal hemorrhoids. - The examination was otherwise normal.   CT abd & pelvis 07/24/21 1. No acute process identified. 2. 6 mm hypodense likely cystic lesion in the head of the pancreas. Recommend initial follow-up contrast-enhanced MRI abdomen or pancreas protocol CT in 6 months. 3. Colonic diverticulosis. 4. Fibroid uterus.   CT abdomen pelvis September 28, 2021 at [facility] Stable 6 mm cyst in the pancreatic head.  Recommended repeat CT or MRI in 12 months     Outpatient Encounter Medications as of 07/23/2022  Medication Sig   Cholecalciferol (VITAMIN D) 50 MCG (2000 UT) tablet Take 4,000 Units by mouth daily.   triamcinolone cream (KENALOG) 0.1 %    vitamin C (ASCORBIC  ACID) 500 MG tablet Take 500 mg by mouth daily.   meclizine (ANTIVERT) 25 MG tablet Take 1 tablet (25 mg total) by mouth 3 (three) times daily as needed for dizziness. (Patient not taking: Reported on 07/23/2022)   [DISCONTINUED] omeprazole (PRILOSEC) 40 MG capsule TAKE 1 CAPSULE (40 MG TOTAL) BY MOUTH DAILY. 30 - 60  MINUTES BEFORE BREAKFAST   [DISCONTINUED] sucralfate (CARAFATE) 1 g tablet Take 1 tablet (1 g total) by mouth 2 (two) times daily for 14 days.   No facility-administered encounter medications on file as of 07/23/2022.    Allergies as of 07/23/2022 - Review Complete 07/23/2022  Allergen Reaction Noted   Penicillins Hives and Itching     Past Medical History:  Diagnosis Date   Allergy    Anemia    borderline   Blood dyscrasia    had test 18 years ago. No diagnosis   Hyperlipidemia    no meds   Irregular heart beat    normal eval by Dr. Mayford Knife in past   Pancreatic lesion    Thyroid nodule     Past Surgical History:  Procedure Laterality Date   CESAREAN SECTION  1999   COLONOSCOPY     DILATATION & CURETTAGE/HYSTEROSCOPY WITH MYOSURE N/A 07/04/2015   Procedure: DILATATION & CURETTAGE/HYSTEROSCOPY WITH MYOSURE;  Surgeon: Ok Edwards, MD;  Location: [facility];  Service: Gynecology;  Laterality: N/A;   DILATION AND CURETTAGE OF UTERUS     x three   HYSTEROSCOPY WITH NOVASURE N/A 07/04/2015   Procedure: Novasure ;  Surgeon: Ok Edwards, MD;  Location: [facility];  Service: Gynecology;  Laterality: N/A;   WISDOM TOOTH EXTRACTION     x2    Family History  Problem Relation Age of Onset   Hypertension Mother    Hyperlipidemia Father    Cancer Sister        thyroid   Hypertension Sister    Heart disease Maternal Grandmother    Alzheimer's disease Paternal Grandmother    Diabetes Neg Hx    Colon cancer Neg Hx    Rectal cancer Neg Hx    Stomach cancer Neg Hx    Esophageal cancer Neg Hx     Social History   Socioeconomic History   Marital status: Married    Spouse name: Not on file   Number of children: Not on file   Years of education: Not on file   Highest education level: Not on file  Occupational History   Occupation: works at [employer]  Tobacco Use   Smoking status: Never   Smokeless tobacco: Never  Vaping Use   Vaping Use: Never used   Substance and Sexual Activity   Alcohol use: Yes    Alcohol/week: 10.0 standard drinks of alcohol    Types: 10 Glasses of wine per week    Comment: 2 glasses of wine daily   Drug use: No   Sexual activity: Not Currently    Birth control/protection: Other-see comments    Comment: vasectomy,des neg  Other Topics Concern   Not on file  Social History Narrative   Married, 3 children, 2 dogs.  Oldest is at [employer] (will graduate 01/2022). Middle at [place]. Son is at [employer].   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      Reviewed 11/2021   Social Determinants of Health   Financial Resource Strain: Not on file  Food Insecurity: Not on file  Transportation Needs: Not on file  Physical Activity: Not on file  Stress: Not on file  Social Connections: Not on file  Intimate Partner Violence: Not on file      Review of systems: All other review of systems negative except as mentioned in the HPI.   Physical Exam: Vitals:   07/23/22 0951  BP: 112/68  Pulse: 88   Body mass index is 23.21 kg/m. Gen:      No acute distress HEENT:  sclera anicteric Abd:      soft, non-tender; no palpable masses, no distension Ext:    No edema Neuro: alert and oriented x 3 Psych: normal mood and affect  Data Reviewed:  Reviewed labs, radiology imaging, old records and pertinent past GI work up   Assessment and Plan/Recommendations:  57 year old very pleasant female with complaints of generalized abdominal bloating, discomfort, IBS with diarrhea and constipation   Workup so far negative for inflammatory bowel disease, celiac disease, colitis or exocrine pancreatic insufficiency   She had erosive gastritis and gastric ulcer on EGD in January 2024.  Biopsies negative for H. pylori, dysplasia or intestinal metaplasia. Epigastric discomfort and dyspepsia Continue antireflux measures.  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 The patient was provided an opportunity to ask questions and all were answered. The patient agreed with the plan and demonstrated an understanding of the instructions.  Iona Beard , MD    CC: Joselyn Arrow, MD

## 2022-07-23 NOTE — Patient Instructions (Addendum)
_______________________________________________________   Complete Deborah Meadows  If your blood pressure at your visit was 140/90 or greater, please contact your primary care physician to follow up on this.  _______________________________________________________  If you are age 59 or older, your body mass index should be between 23-30. Your Body mass index is 23.21 kg/m. If this is out of the aforementioned range listed, please consider follow up with your Primary Care Provider.  If you are age 26 or younger, your body mass index should be between 19-25. Your Body mass index is 23.21 kg/m. If this is out of the aformentioned range listed, please consider follow up with your Primary Care Provider.   ________________________________________________________  The [facility] providers would like to encourage you to use [facility] to communicate with providers for non-urgent requests or questions.  Due to long hold times on the telephone, sending your provider a message by [facility] may be a faster and more efficient way to get a response.  Please allow 48 business hours for a response.  Please remember that this is for non-urgent requests.  _______________________________________________________   I appreciate the  opportunity to care for you  Thank You   Marsa Aris , MD

## 2022-07-25 ENCOUNTER — Encounter: Payer: Self-pay | Admitting: Gastroenterology

## 2022-07-25 IMAGING — MG MM DIGITAL SCREENING BILAT W/ TOMO AND CAD
8 series · 9 of 24 positions shown · non-contrast
Comparison: Previous exam(s).

CLINICAL DATA: Screening.

EXAM:
DIGITAL SCREENING BILATERAL MAMMOGRAM WITH TOMOSYNTHESIS AND CAD
TECHNIQUE: Bilateral screening digital craniocaudal and mediolateral oblique
mammograms were obtained. Bilateral screening digital breast
tomosynthesis was performed. The images were evaluated with
computer-aided detection.

[L MLO synth-2D]
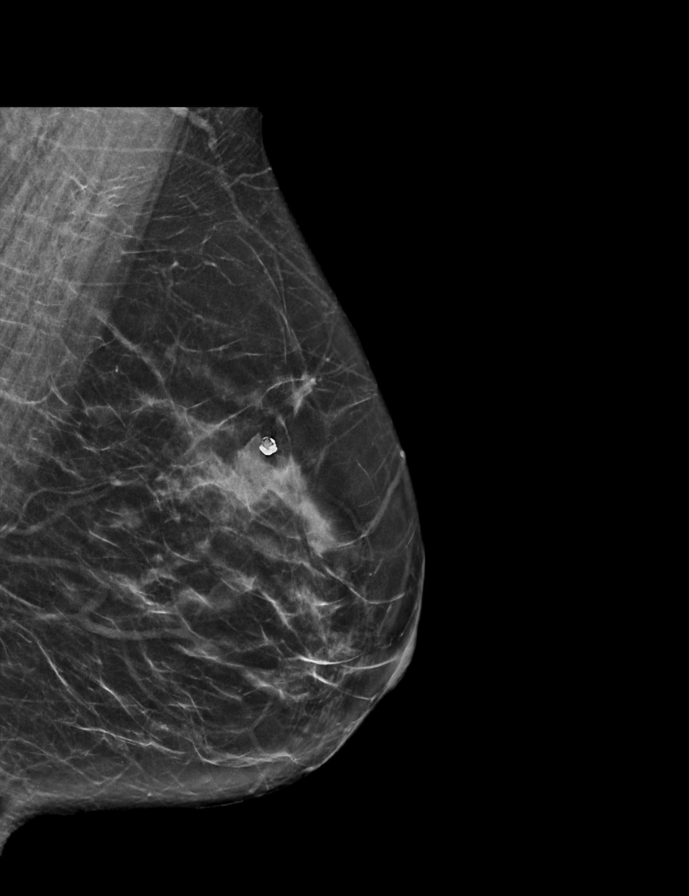

[R MLO synth-2D]
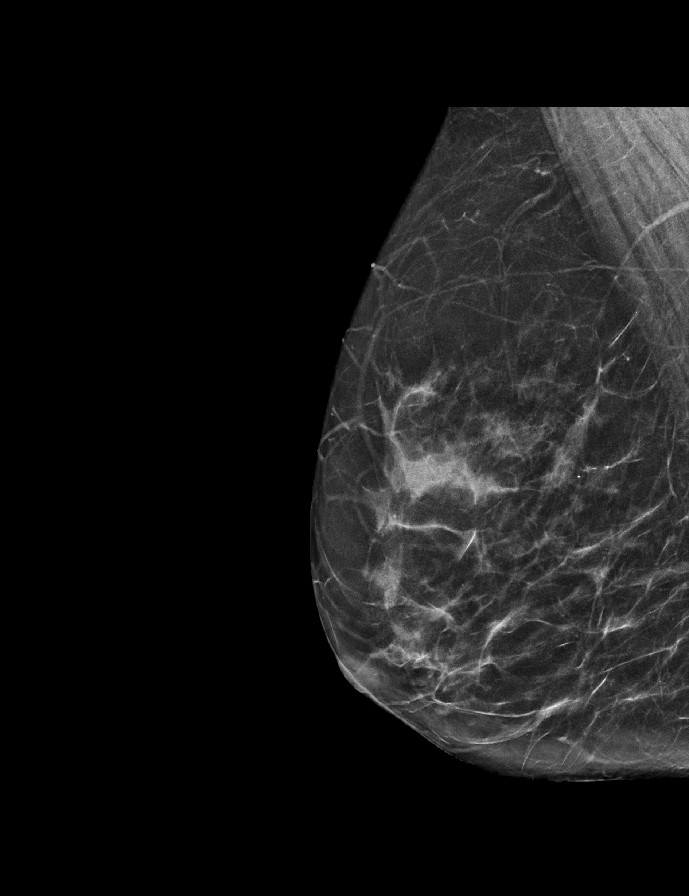

[R CC synth-2D]
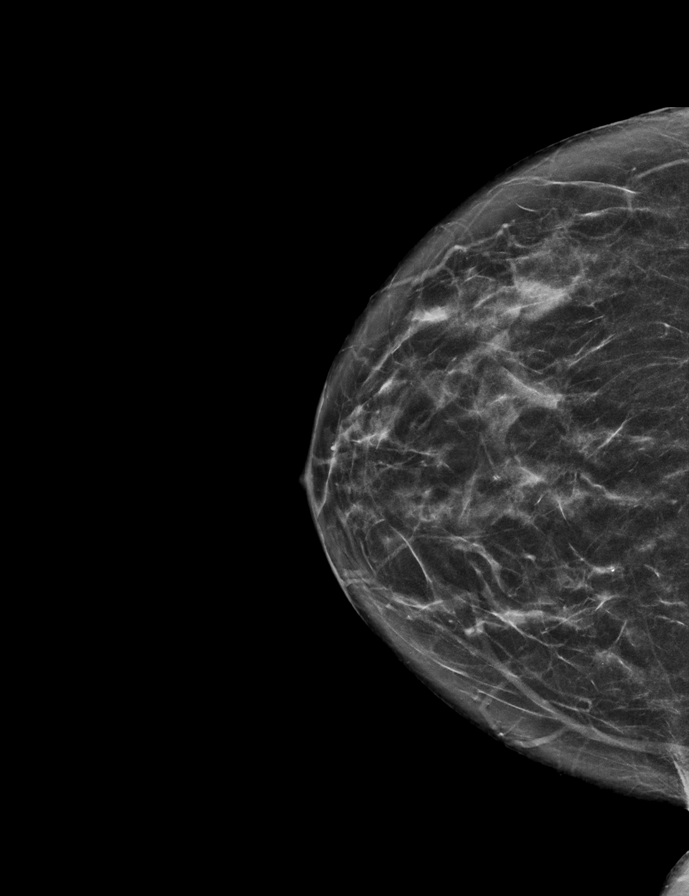

[L CC synth-2D]
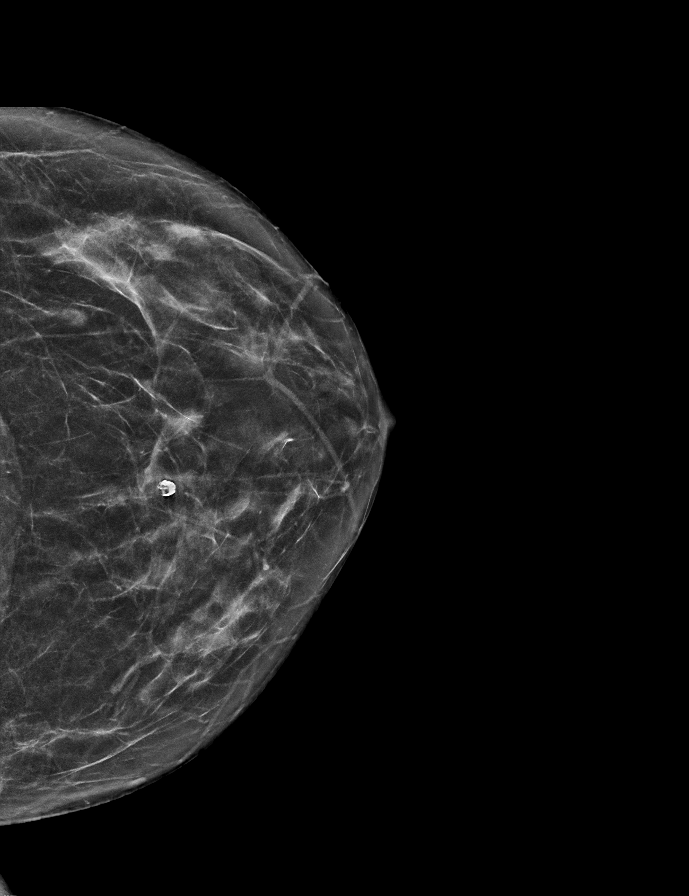

[R CC tomo · 2 of 58 frames shown]
[frame 19/58]
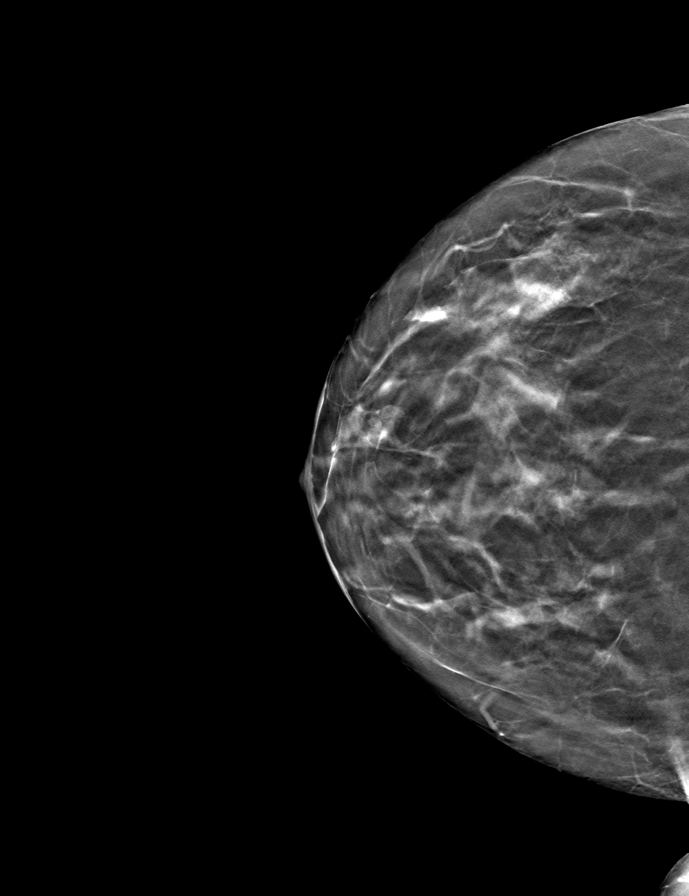
[frame 29/58]
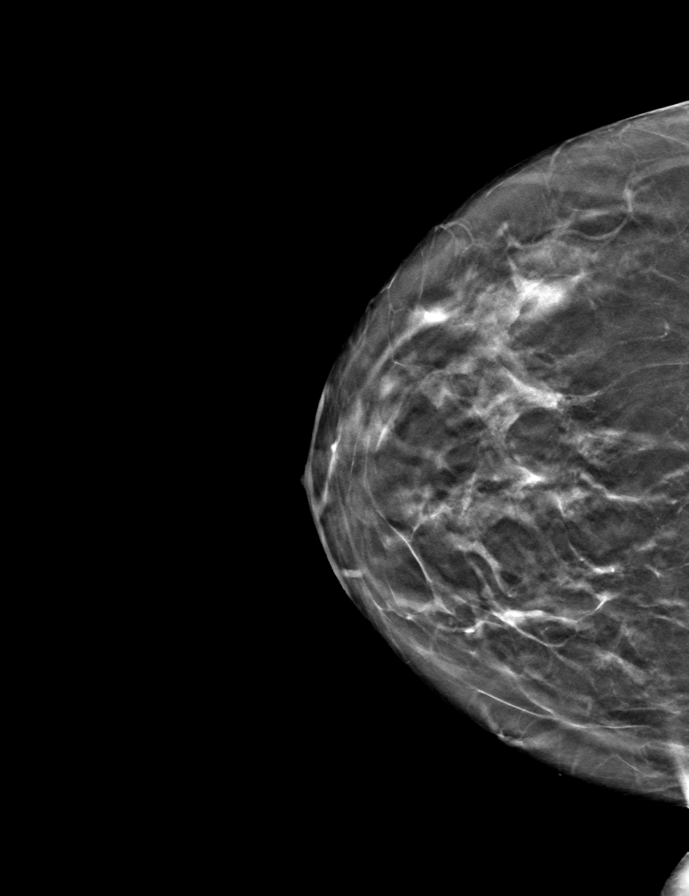

[R MLO tomo · tomo slice 29/57.0]
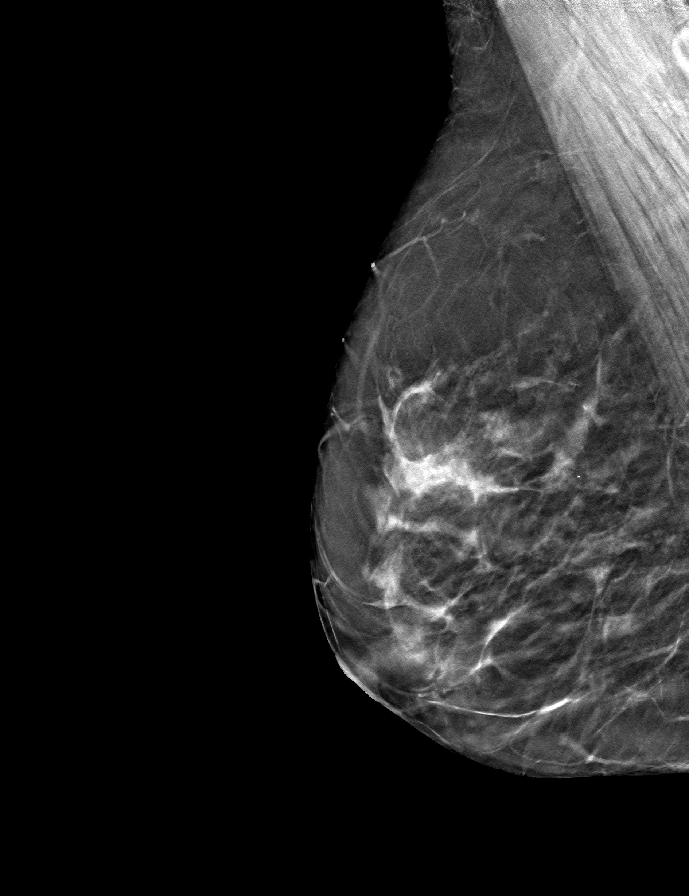

[L MLO tomo · tomo slice 30/59.0]
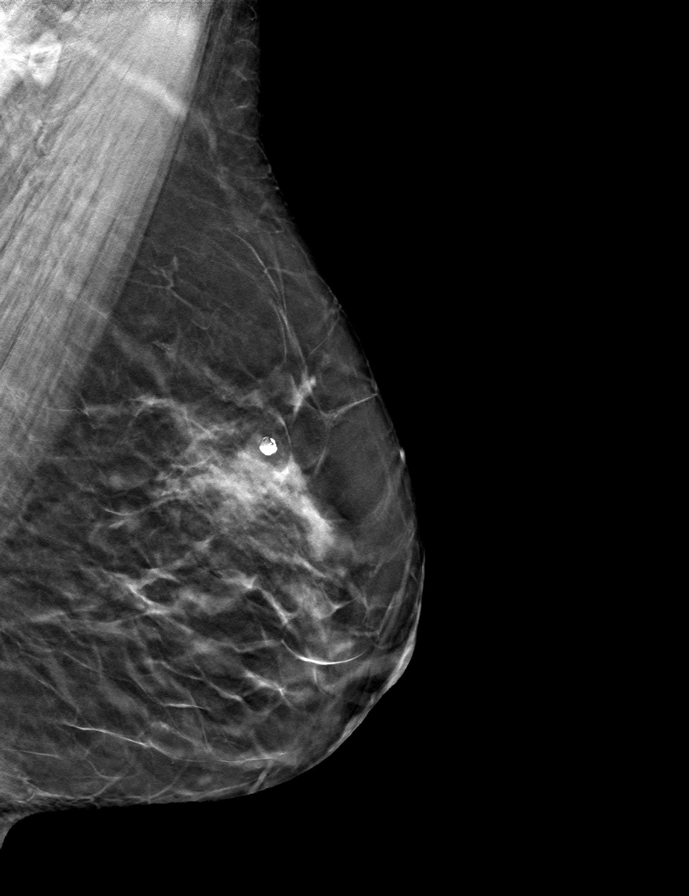

[L CC tomo · tomo slice 27/53.0]
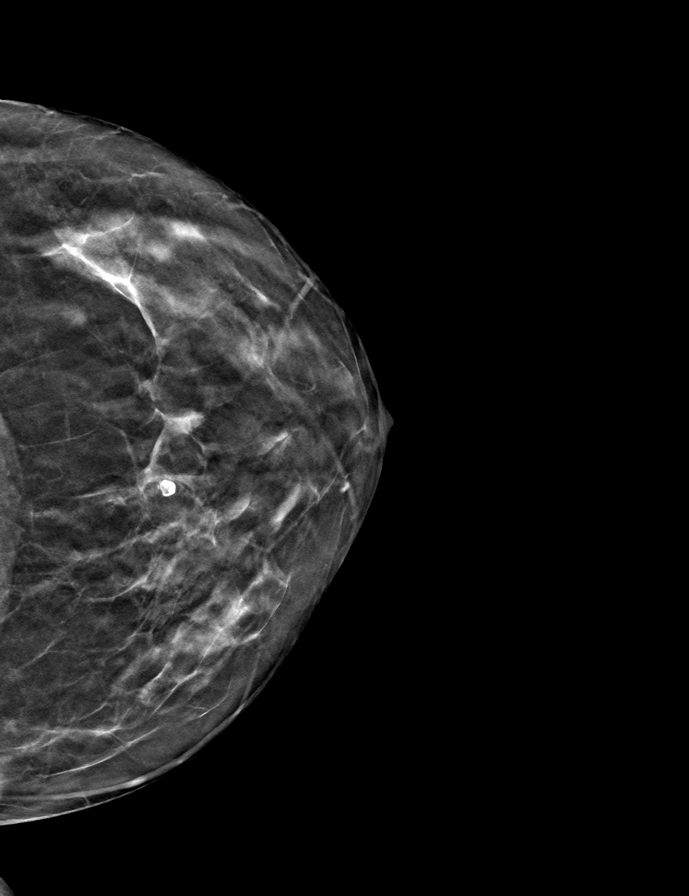

[9 of 24 positions shown; findings below may reference images not displayed]

ACR Breast Density Category c: The breast tissue is heterogeneously
dense, which may obscure small masses.
FINDINGS: There are no findings suspicious for malignancy. The images were
evaluated with computer-aided detection.
IMPRESSION: No mammographic evidence of malignancy. A result letter of this
screening mammogram will be mailed directly to the patient.

RECOMMENDATION:
Screening mammogram in one year. (Code:T4-5-GWO)

BI-RADS CATEGORY  1: Negative.

## 2022-08-26 DIAGNOSIS — D485 Neoplasm of uncertain behavior of skin: Secondary | ICD-10-CM | POA: Diagnosis not present

## 2022-08-26 DIAGNOSIS — D225 Melanocytic nevi of trunk: Secondary | ICD-10-CM | POA: Diagnosis not present

## 2022-08-26 DIAGNOSIS — L821 Other seborrheic keratosis: Secondary | ICD-10-CM | POA: Diagnosis not present

## 2022-08-26 DIAGNOSIS — D2261 Melanocytic nevi of right upper limb, including shoulder: Secondary | ICD-10-CM | POA: Diagnosis not present

## 2022-08-26 DIAGNOSIS — D2262 Melanocytic nevi of left upper limb, including shoulder: Secondary | ICD-10-CM | POA: Diagnosis not present

## 2022-08-26 DIAGNOSIS — D2271 Melanocytic nevi of right lower limb, including hip: Secondary | ICD-10-CM | POA: Diagnosis not present

## 2022-09-09 ENCOUNTER — Encounter: Payer: Self-pay | Admitting: Family Medicine

## 2022-09-11 DIAGNOSIS — H5213 Myopia, bilateral: Secondary | ICD-10-CM | POA: Diagnosis not present

## 2022-09-11 DIAGNOSIS — H10413 Chronic giant papillary conjunctivitis, bilateral: Secondary | ICD-10-CM | POA: Diagnosis not present

## 2022-09-18 NOTE — Progress Notes (Signed)
Chief Complaint  Patient presents with   Ankle Pain    Ankles (joints) have been hurting her about a week. And has been having tension is from top of her head down to her shoulders several days.    Patient had sent a message about having swollen ankle, joint pain, as well as left hip pain. She was asking about tests for inflammation, RA, possibly x-rays.  She presents to discuss her concerns.  She has been having bilateral ankle pain for 7-10 days.  It hurts around the entire ankle, at the level of where an ankle sock sits. She denies any trauma/injury or change in activity.  She has new "strappy sandals" that she only wears to church.  Wearing most Sundays since May.  The strap is uncomfortable across the front of the ankle, but it doesn't limit her walking in them.  No pain at other times until more recently. Mainly wears birks and tennis shoes. Uses peleton app for workouts, and has no pain with exercise.  She had some swelling of the ankles 3 weeks ago, very mild, lasted for a week or so. No change in sodium intake.   Having pizza 2x/month, a little more eggs. No change in diet otherwise.  "I know I'm fat"  No longer has pain in knees as she did back in 11/2021.  She also reports having a burning sensation on the bottom of her feet. Sometimes it wakes her up at night, is very mild during the day. Notices it after prolonged sitting.  When standing/walking/sitting all day at work, she doesn't notice this.  There is FHx of neuropathy--mother and MGM  Review of relevant labs in chart-- Lab Results  Component Value Date   ESRSEDRATE 34 (H) 11/28/2021   Lab Results  Component Value Date   CRP 2.8 11/28/2021   Lab Results  Component Value Date   HGBA1C 5.4 11/22/2021    PMH, PSH, SH reviewed  Outpatient Encounter Medications as of 09/19/2022  Medication Sig Note   Cholecalciferol (VITAMIN D) 50 MCG (2000 UT) tablet Take 4,000 Units by mouth daily.    vitamin C (ASCORBIC ACID)  500 MG tablet Take 500 mg by mouth daily.    meclizine (ANTIVERT) 25 MG tablet Take 1 tablet (25 mg total) by mouth 3 (three) times daily as needed for dizziness. (Patient not taking: Reported on 07/23/2022) 09/19/2022: As needed   triamcinolone cream (KENALOG) 0.1 %  (Patient not taking: Reported on 09/19/2022) 09/19/2022: As needed   No facility-administered encounter medications on file as of 09/19/2022.   Allergies  Allergen Reactions   Penicillins Hives and Itching    Has patient had a PCN reaction causing immediate rash, facial/tongue/throat swelling, SOB or lightheadedness with hypotension: Yes Has patient had a PCN reaction causing severe rash involving mucus membranes or skin necrosis: No Has patient had a PCN reaction that required hospitalization No Has patient had a PCN reaction occurring within the last 10 years: No If all of the above answers are "NO", then may proceed with Cephalosporin use.     ROS: no f/c, URI symptoms, chest pain, shortness of breath, GI complaints. LE edema per HPI, resolved. Ankle pain, bilaterally, diffuse around ankle, per HPI x 7-10 days. No other joint pains. Moods are good. See HPI    PHYSICAL EXAM:  BP 126/80   Pulse 82   Ht 5\' 3"  (1.6 m)   Wt 142 lb (64.4 kg)   LMP 07/04/2015   BMI 25.15 kg/m  Wt Readings from Last 3 Encounters:  09/19/22 142 lb (64.4 kg)  07/23/22 131 lb (59.4 kg)  04/12/22 137 lb (62.1 kg)   Pleasant, well-appearing female in no distress, somewhat excitable per usual HEENT: conjunctiva and sclera are clear, EOMI.  Neuro: alert and oriented. Grossly normal cranial nerves.  Normal gait. Psych: normal mood, affect, hygiene and grooming. Extremities:  2+ pulses FROM at ankles. Very slight STS noted anterolaterally at the R ankle. There was discomfort in the same area with dorsifelxion and inversion against resistance. No tenderness elsewhere. No tenderness on LLE, nor any STS.     ASSESSMENT/PLAN:  Tendonitis  of ankle, right - discussed ice, compression, NSAID. Refer to podiatrist if not improving. Possible neuropathy also discussed. Labs at CPE, sooner if worsening  Tendonitis of R ankle. Prefers to avoid oral NSAIDs if possible. Recommend ice, compression and topical voltaren gel. If not improving, will take an oral anti-inflammatory.. If still not improving, refer to podiatry.   Recheck labs at physical in November, include B12 due to neuropathy sx. (To consider taking MVI or B12 in interim)

## 2022-09-19 ENCOUNTER — Encounter: Payer: Self-pay | Admitting: Family Medicine

## 2022-09-19 ENCOUNTER — Ambulatory Visit (INDEPENDENT_AMBULATORY_CARE_PROVIDER_SITE_OTHER): Payer: BC Managed Care – PPO | Admitting: Family Medicine

## 2022-09-19 VITALS — BP 126/80 | HR 82 | Ht 63.0 in | Wt 142.0 lb

## 2022-09-19 DIAGNOSIS — M7751 Other enthesopathy of right foot: Secondary | ICD-10-CM | POA: Diagnosis not present

## 2022-09-19 NOTE — Patient Instructions (Signed)
I believe you have a tendonitis at the right ankle. Try ice and compression. Try using Voltaren gel. If you have persistent or worsening discomfort or swelling, then we should switch to an oral anti-inflammatory (ibuprofen or naproxen over the counter, or a prescription--meloxicam is a once daily one that I often prescribe).  We discussed the possible neuropathy in your feet. You may want to consider taking a multivitamin, or a B12 to see if this helps. We will check levels at your physical in November (sooner if worsening).

## 2022-10-02 DIAGNOSIS — D485 Neoplasm of uncertain behavior of skin: Secondary | ICD-10-CM | POA: Diagnosis not present

## 2022-10-02 DIAGNOSIS — L988 Other specified disorders of the skin and subcutaneous tissue: Secondary | ICD-10-CM | POA: Diagnosis not present

## 2022-11-22 ENCOUNTER — Other Ambulatory Visit (HOSPITAL_BASED_OUTPATIENT_CLINIC_OR_DEPARTMENT_OTHER): Payer: Self-pay

## 2022-11-22 MED ORDER — COVID-19 MRNA VAC-TRIS(PFIZER) 30 MCG/0.3ML IM SUSY
0.3000 mL | PREFILLED_SYRINGE | Freq: Once | INTRAMUSCULAR | 0 refills | Status: AC
  Filled 2022-11-22: qty 0.3, 1d supply, fill #0

## 2022-12-17 NOTE — Progress Notes (Unsigned)
No chief complaint on file.   Deborah Meadows is a 57 y.o. female who presents for a complete physical.  She has the following concerns:  She has been under the care of Dr. Lavon Paganini for generalized abdominal bloating, discomfort, IBS with diarrhea and constipation. She had negative work-up for IBD, celiac, colitis, exocrine pancreatic insufficiency.   She had erosive gastritis and gastric ulcer on EGD in January 2024.  Biopsies negative for H. pylori, dysplasia or intestinal metaplasia. She was treated with omeprazole 40 mg daily x 3 months, then tapered off. She was also treated with a course of Carafate.  Cystic lesion of head of the pancreas:  this has been monitored with CT (pt couldn't tolerate MRI). Last CT was done at [facility] in 07/2022: IMPRESSION:  1. Nonspecific 7 mm cystic lesion of the head of the pancreas not significantly changed from previous exams dating back to June 2023. Recommendations vary for follow-up although follow-up for a total of 10 years is often recommended. Additional follow-up exams possibly with contrasted MRI could be obtained in 2 years, 5 years and 9 years as indicated.  2.  Shotty nonspecific lymph nodes of the mesentery and retroperitoneum possibly reactive in nature.  3.  Mild wall thickening and stranding of the bladder possibly due to incomplete distention although acute or chronic cystitis could have such an appearance.  4.  Fibroid uterus.     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  She denies any change to the isthmus cyst, denies any symptoms related to the cyst.  She is aware that if it becomes bothersome she can f/u with radiology (spoke to Dr. Tommie Raymond about it) for aspiration.    White coat HTN--her wrist BP monitor has been verified as accurate (last in 07/2021).   BP's at home are running   Hypercholesterolemia--she has had excellent HDL.  Her LDL cholesterol periodically is high, related to diet (up to 167 as highest, on last check  in 11/2021). She is due for recheck.  Last year she had reported a little more pizza, limits to 1 slice. Some egg salad with mayo, chips. She limits egg yolks in general, has more whites.  Lab Results  Component Value Date   CHOL 279 (H) 11/22/2021   HDL 103 11/22/2021   LDLCALC 167 (H) 11/22/2021   TRIG 62 11/22/2021   CHOLHDL 2.7 11/22/2021    Vitamin D deficiency was noted in 2017, with a level of 26.  Last level was 63.6 in 11/2020, when taking 4000 IU 3x/week. Currently taking the same supplements. She drinks 2 cups of skim Fairlife milk daily.   Immunization History  Administered Date(s) Administered   Influenza Split 11/01/2010   Influenza,inj,Quad PF,6+ Mos 11/02/2012, 10/05/2015, 10/09/2016, 10/15/2017, 10/28/2018, 11/11/2019, 11/13/2020, 11/22/2021   Influenza-Unspecified 12/03/2022   PFIZER(Purple Top)SARS-COV-2 Vaccination 05/26/2019, 06/16/2019   Pfizer Covid-19 Vaccine Bivalent Booster 30yrs & up 10/19/2020   Pfizer(Comirnaty)Fall Seasonal Vaccine 12 years and older 11/22/2022   Tdap 11/02/2012   Last Pap smear: 02/2021, normal, negative high risk HPV (through GYN) Last mammogram: 05/2022 Last colonoscopy: 03/2019--diverticulosis, small internal hemorrhoids.  No polyps.  Repeat 5 years (had 13mm tubular adenoma in 02/2016). Last DEXA: never  Dentist:  twice yearly Ophtho: yearly Exercise:   Peleton app averaging 30 mins day, 7 days/week. Does "full body strength"--strength and cardio.  "strolls" with her dogs every night. 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   PMH, PSH, SH and  FH were reviewed and updated   ROS:  The patient denies anorexia, fever, weight changes, headaches, vision changes, decreased hearing, ear pain, sore throat, breast concerns, dizziness, syncope, dyspnea on exertion, cough, swelling, nausea, vomiting, constipation, abdominal pain, melena, hematochezia, indigestion/heartburn, hematuria, incontinence, dysuria, vaginal  bleeding, discharge, odor or itch, genital lesions, joint pains, numbness, tingling, weakness, tremor, suspicious skin lesions, depression, anxiety, abnormal bleeding/bruising, or enlarged lymph nodes.  Postmenopausal.  Only mild hot flashes. No sweats. Some chronic postnasal drainage. Nosebleeds? Eczema--occasionally flares behind her ears, and on neck, uses steroid cream prn. Sees derm for skin checks. Rare palpitations, unchanged. No change to cyst in neck. Intermittent diarrhea? Bloating?   PHYSICAL EXAM:  LMP 07/04/2015   Wt Readings from Last 3 Encounters:  09/19/22 142 lb (64.4 kg)  07/23/22 131 lb (59.4 kg)  04/12/22 137 lb (62.1 kg)   General Appearance:    Alert, cooperative, no distress, appears stated age. She declined getting changed into gown today (sees GYN, derm)  Head:    Normocephalic, without obvious abnormality, atraumatic  Eyes:   PERRL, conjunctiva/corneas clear, EOM's intact, fundi benign  Ears:    Normal TM's and external ear canals.  Nose:   Normal, no drainage or sinus tenderness  Throat:   Normal, no lesions  Neck:  Supple, no lymphadenopathy; thyroid: nontender. There is a 1-1.5cm soft, superficial, mobile mass just to the left of midline over thyroid, unchanged from prior visit. No carotid bruit or JVD  Back:    Spine nontender, no curvature, ROM normal, no CVA  tenderness  Lungs:   Clear to auscultation bilaterally without wheezes, rales or ronchi;  respirations unlabored  Chest Wall:    No tenderness or deformity   Heart:    Regular rate and rhythm, S1 and S2 normal, no murmur, rub or gallop.  Breast Exam:    Deferred to GYN  Abdomen:     Soft, non-tender, nondistended, normoactive bowel sounds, no masses, no hepatosplenomegaly  Genitalia:    Deferred to GYN       Extremities:   No clubbing, cyanosis or edema.  Pulses:   2+ and symmetric all extremities  Skin:   Skin color, texture, turgor normal. Exam limited (not changed into gown).  Lymph  nodes:   Cervical, supraclavicular nodes normal  Neurologic:   Normal strength, sensation and gait; reflexes 2+ and symmetric throughout                 Psych:   Normal mood, affect, hygiene and grooming   ***UPDATE thyroid isthmus cyst, if in gown  ASSESSMENT/PLAN:   Tdap Shingles?  Or NV in 2 weeks??  TSH if any symptoms D if any change in supplements   Discussed monthly self breast exams and yearly mammograms; at least 30 minutes of aerobic activity at least 5 days/week, weight-bearing exercise at least 2x/week; proper sunscreen use reviewed; healthy diet, including goals of calcium and vitamin D intake and alcohol recommendations (less than or equal to 1 drink/day) reviewed; regular seatbelt use; changing batteries in smoke detectors, carbon monoxide detectors.  Immunization recommendations discussed--continue yearly flu shots.   Tdap given today.  Shingrix discussed, recommended, to return for NV when desired.   Colonoscopy recommendations reviewed, UTD (due again 03/2024)  F/u 1 year, sooner prn.

## 2022-12-17 NOTE — Patient Instructions (Incomplete)
  HEALTH MAINTENANCE RECOMMENDATIONS:  It is recommended that you get at least 30 minutes of aerobic exercise at least 5 days/week (for weight loss, you may need as much as 60-90 minutes). This can be any activity that gets your heart rate up. This can be divided in 10-15 minute intervals if needed, but try and build up your endurance at least once a week.  Weight bearing exercise is also recommended twice weekly.  Eat a healthy diet with lots of vegetables, fruits and fiber.  "Colorful" foods have a lot of vitamins (ie green vegetables, tomatoes, red peppers, etc).  Limit sweet tea, regular sodas and alcoholic beverages, all of which has a lot of calories and sugar.  Up to 1 alcoholic drink daily may be beneficial for women (unless trying to lose weight, watch sugars).  Drink a lot of water.  Calcium recommendations are 1200-1500 mg daily (1500 mg for postmenopausal women or women without ovaries), and vitamin D 1000 IU daily.  This should be obtained from diet and/or supplements (vitamins), and calcium should not be taken all at once, but in divided doses.  Monthly self breast exams and yearly mammograms for women over the age of 64 is recommended.  Sunscreen of at least SPF 30 should be used on all sun-exposed parts of the skin when outside between the hours of 10 am and 4 pm (not just when at beach or pool, but even with exercise, golf, tennis, and yard work!)  Use a sunscreen that says "broad spectrum" so it covers both UVA and UVB rays, and make sure to reapply every 1-2 hours.  Remember to change the batteries in your smoke detectors when changing your clock times in the spring and fall. Carbon monoxide detectors are recommended for your home.  Use your seat belt every time you are in a car, and please drive safely and not be distracted with cell phones and texting while driving.  I recommend getting the new shingles vaccine (Shingrix).  This is a series of 2 injections, spaced 2 months  apart.  It doesn't have to be exactly 2 months apart (but can't be sooner), if that isn't feasible for your schedule, but try and get them close to 2 months (and definitely within 6 months of each other, or else the efficacy of the vaccine drops off). This should be separated from other vaccines by at least 2 weeks.  Check your Xifaxan bottle at home.  Question is whether you stopped it early due to it making things worse, or if you didn't try it.  If you didn't try it, I recommend taking the course.

## 2022-12-18 ENCOUNTER — Encounter: Payer: Self-pay | Admitting: Family Medicine

## 2022-12-18 ENCOUNTER — Ambulatory Visit (INDEPENDENT_AMBULATORY_CARE_PROVIDER_SITE_OTHER): Payer: BC Managed Care – PPO | Admitting: Family Medicine

## 2022-12-18 VITALS — BP 120/76 | HR 80 | Ht 63.0 in | Wt 140.6 lb

## 2022-12-18 DIAGNOSIS — Z0000 Encounter for general adult medical examination without abnormal findings: Secondary | ICD-10-CM | POA: Diagnosis not present

## 2022-12-18 DIAGNOSIS — Z23 Encounter for immunization: Secondary | ICD-10-CM

## 2022-12-18 DIAGNOSIS — E041 Nontoxic single thyroid nodule: Secondary | ICD-10-CM

## 2022-12-18 DIAGNOSIS — R635 Abnormal weight gain: Secondary | ICD-10-CM | POA: Diagnosis not present

## 2022-12-18 DIAGNOSIS — E7800 Pure hypercholesterolemia, unspecified: Secondary | ICD-10-CM

## 2022-12-18 DIAGNOSIS — K862 Cyst of pancreas: Secondary | ICD-10-CM

## 2022-12-19 ENCOUNTER — Encounter: Payer: Self-pay | Admitting: Gastroenterology

## 2022-12-19 ENCOUNTER — Encounter: Payer: Self-pay | Admitting: Family Medicine

## 2022-12-19 LAB — CMP14+EGFR
ALT: 86 IU/L — ABNORMAL HIGH (ref 0–32)
AST: 144 IU/L — ABNORMAL HIGH (ref 0–40)
Albumin: 4.6 g/dL (ref 3.8–4.9)
Alkaline Phosphatase: 83 [IU]/L (ref 44–121)
BUN/Creatinine Ratio: 18 (ref 9–23)
BUN: 11 mg/dL (ref 6–24)
Bilirubin Total: 0.6 mg/dL (ref 0.0–1.2)
CO2: 22 mmol/L (ref 20–29)
Calcium: 9.8 mg/dL (ref 8.7–10.2)
Chloride: 100 mmol/L (ref 96–106)
Creatinine, Ser: 0.6 mg/dL (ref 0.57–1.00)
Globulin, Total: 2.4 g/dL (ref 1.5–4.5)
Glucose: 90 mg/dL (ref 70–99)
Potassium: 4.9 mmol/L (ref 3.5–5.2)
Sodium: 139 mmol/L (ref 134–144)
Total Protein: 7 g/dL (ref 6.0–8.5)
eGFR: 104 mL/min/{1.73_m2} (ref >59)

## 2022-12-19 LAB — TSH: TSH: 1.9 u[IU]/mL (ref 0.450–4.500)

## 2022-12-19 LAB — CBC WITH DIFFERENTIAL/PLATELET
Basophils Absolute: 0 10*3/uL (ref 0.0–0.2)
Basos: 1 %
EOS (ABSOLUTE): 0 10*3/uL (ref 0.0–0.4)
Eos: 1 %
Hematocrit: 40.2 % (ref 34.0–46.6)
Hemoglobin: 13 g/dL (ref 11.1–15.9)
Immature Grans (Abs): 0 10*3/uL (ref 0.0–0.1)
Immature Granulocytes: 0 %
Lymphocytes Absolute: 1.1 10*3/uL (ref 0.7–3.1)
Lymphs: 32 %
MCH: 32.9 pg (ref 26.6–33.0)
MCHC: 32.3 g/dL (ref 31.5–35.7)
MCV: 102 fL — ABNORMAL HIGH (ref 79–97)
Monocytes Absolute: 0.4 10*3/uL (ref 0.1–0.9)
Monocytes: 12 %
Neutrophils Absolute: 1.8 10*3/uL (ref 1.4–7.0)
Neutrophils: 54 %
Platelets: 190 10*3/uL (ref 150–450)
RBC: 3.95 x10E6/uL (ref 3.77–5.28)
RDW: 12.3 % (ref 11.7–15.4)
WBC: 3.3 10*3/uL — ABNORMAL LOW (ref 3.4–10.8)

## 2022-12-19 LAB — LIPID PANEL
Chol/HDL Ratio: 2.8 ratio (ref 0.0–4.4)
Cholesterol, Total: 285 mg/dL — ABNORMAL HIGH (ref 100–199)
HDL: 101 mg/dL (ref >39)
LDL Chol Calc (NIH): 175 mg/dL — ABNORMAL HIGH (ref 0–99)
Triglycerides: 60 mg/dL (ref 0–149)
VLDL Cholesterol Cal: 9 mg/dL (ref 5–40)

## 2022-12-25 ENCOUNTER — Ambulatory Visit (HOSPITAL_BASED_OUTPATIENT_CLINIC_OR_DEPARTMENT_OTHER)
Admission: RE | Admit: 2022-12-25 | Discharge: 2022-12-25 | Disposition: A | Discharge status: Home / Self Care | Payer: BC Managed Care – PPO | Source: Ambulatory Visit | Attending: Family Medicine | Admitting: Family Medicine

## 2022-12-25 DIAGNOSIS — E7800 Pure hypercholesterolemia, unspecified: Secondary | ICD-10-CM | POA: Insufficient documentation

## 2023-01-03 ENCOUNTER — Telehealth: Payer: Self-pay | Admitting: Cardiology

## 2023-01-03 NOTE — Telephone Encounter (Signed)
Deborah Meadows with [facility] called in to f/u on behalf of Dr. Lynelle Doctor about CT cardiac scoring being read.

## 2023-01-06 NOTE — Telephone Encounter (Signed)
Messaged Dr. Lynelle Doctor and routed CT calcium score to her as well.

## 2023-01-13 ENCOUNTER — Encounter: Payer: Self-pay | Admitting: Gastroenterology

## 2023-01-13 ENCOUNTER — Other Ambulatory Visit (INDEPENDENT_AMBULATORY_CARE_PROVIDER_SITE_OTHER): Payer: BC Managed Care – PPO

## 2023-01-13 ENCOUNTER — Ambulatory Visit: Payer: BC Managed Care – PPO | Admitting: Gastroenterology

## 2023-01-13 VITALS — BP 122/68 | HR 68 | Ht 63.0 in | Wt 136.0 lb

## 2023-01-13 DIAGNOSIS — R748 Abnormal levels of other serum enzymes: Secondary | ICD-10-CM

## 2023-01-13 DIAGNOSIS — R140 Abdominal distension (gaseous): Secondary | ICD-10-CM | POA: Diagnosis not present

## 2023-01-13 DIAGNOSIS — K581 Irritable bowel syndrome with constipation: Secondary | ICD-10-CM

## 2023-01-13 DIAGNOSIS — K862 Cyst of pancreas: Secondary | ICD-10-CM

## 2023-01-13 DIAGNOSIS — R7401 Elevation of levels of liver transaminase levels: Secondary | ICD-10-CM

## 2023-01-13 DIAGNOSIS — K582 Mixed irritable bowel syndrome: Secondary | ICD-10-CM | POA: Diagnosis not present

## 2023-01-13 LAB — HEPATIC FUNCTION PANEL
ALT: 35 U/L (ref 0–35)
AST: 40 U/L — ABNORMAL HIGH (ref 0–37)
Albumin: 4.3 g/dL (ref 3.5–5.2)
Alkaline Phosphatase: 51 U/L (ref 39–117)
Bilirubin, Direct: 0.2 mg/dL (ref 0.0–0.3)
Total Bilirubin: 0.7 mg/dL (ref 0.2–1.2)
Total Protein: 7.1 g/dL (ref 6.0–8.3)

## 2023-01-13 NOTE — Progress Notes (Signed)
Deborah Meadows    [id number]    May 08, 1965  Primary Care Physician:Knapp, Clarene Critchley, MD  Referring Physician: Joselyn Arrow, MD [address] [place],  [place] [address]   Chief complaint:  IBS  Discussed the use of AI scribe software for clinical note transcription with the patient, who gave verbal consent to proceed.  History of Present Illness   57 year old very pleasant female the patient, with a known pancreatic cyst, presents with concerns about her health due to her mother's recent diagnosis of stage three pancreatic cancer. She expresses a desire to be proactive in monitoring her own pancreatic cyst, indicating a willingness to fight insurance for more frequent scans than the recommended 2-5 year interval. She is considering undergoing the MRI for a better picture of the cyst.  The patient has also noticed a consistent decrease in her resting heart rate from the 70s to the 50s since she stopped drinking alcohol a month ago. She reports no current pain, no blood in stool, and regular bowel habits, although her schedule is slightly off due to frequent hospital visits for her mother. She has lost a few pounds recently, which she attributes to the cessation of alcohol and menopause. She also mentions a previous high liver enzyme (AST and ALT) result, which she is surprised about given her abstinence from alcohol. She requests a blood test to check these levels again.  The patient emphasizes the importance of having a primary care doctor who listens to her concerns, as she believes her mother's diagnosis was delayed due to a lack of attention from her primary care doctor. She expresses a strong desire to be her own advocate in her healthcare.     GI Hx:  She had CT abdomen pelvis with and without contrast [facility] on July 18, 2022 1.  Nonspecific 7 mm cystic lesion of the head of the pancreas not significantly changed from previous exams dating back to June 2023. Recommendations  vary for follow-up although follow-up for a total of 10 years is often recommended. Additional follow-up exams possibly with contrasted MRI could be obtained in 2 years, 5 years and 9 years as indicated.  2.  Shotty nonspecific lymph nodes of the mesentery and retroperitoneum possibly reactive in nature.  3.  Mild wall thickening and stranding of the bladder possibly due to incomplete distention although acute or chronic cystitis could have such an appearance.  4.  Fibroid uterus.       TTG IgA antibody negative for celiac disease Fecal elastase normal Fecal lactoferrin negative Normal fecal fat   EGD 02/20/2022 - LA Grade B reflux esophagitis with no bleeding.  - Benign-appearing esophageal stenosis - 2 cm hiatal hernia.  - Non-bleeding gastric ulcer with no stigmata of bleeding. Biopsied.  - Gastritis. Biopsied.  - Normal examined duodenum   Pathology 02/20/2022 1. Surgical [P], gastric antrum and gastric body bx - ANTRAL AND OXYNTIC TYPE MUCOSAE WITH REACTIVE (CHEMICAL) GASTROPATHY - NEGATIVE FOR HELICOBACTER ORGANISMS ON H&E STAIN - NEGATIVE FOR DYSPLASIA OR MALIGNANCY 2. Surgical [P], gastric ulcer bx - ANTRAL MUCOSA WITH MARKED REACTIVE (CHEMICAL) GASTROPATHY AND FOCAL EROSION - IMMUNOHISTOCHEMICAL STAIN FOR HELICOBACTER ORGANISMS IS NEGATIVE - NEGATIVE FOR DYSPLASIA OR MALIGNANCY   Colonoscopy February 2021 - Diverticulosis in the sigmoid colon. - Non-bleeding internal hemorrhoids. - The examination was otherwise normal. - No specimens collected   Colonoscopy January 2018 - One 13 mm polyp in the cecum, removed with a hot snare.  Resected and retrieved.  Tubular adenoma - Non-bleeding internal hemorrhoids. - The examination was otherwise normal.   CT abd & pelvis 07/24/21 1. No acute process identified. 2. 6 mm hypodense likely cystic lesion in the head of the pancreas. Recommend initial follow-up contrast-enhanced MRI abdomen or pancreas protocol CT in 6 months. 3.  Colonic diverticulosis. 4. Fibroid uterus.   CT abdomen pelvis September 28, 2021 at [facility] Stable 6 mm cyst in the pancreatic head.  Recommended repeat CT or MRI in 12 months           Out[facility]       Outpatient Encounter Medications as of 01/13/2023  Medication Sig   Cholecalciferol (VITAMIN D) 50 MCG (2000 UT) tablet Take 2,000 Units by mouth daily.   meclizine (ANTIVERT) 25 MG tablet Take 1 tablet (25 mg total) by mouth 3 (three) times daily as needed for dizziness.   triamcinolone cream (KENALOG) 0.1 %    No facility-administered encounter medications on file as of 01/13/2023.    Allergies as of 01/13/2023 - Review Complete 01/13/2023  Allergen Reaction Noted   Penicillins Hives and Itching     Past Medical History:  Diagnosis Date   Allergy    Anemia    borderline   Blood dyscrasia    had test 18 years ago. No diagnosis   Hyperlipidemia    no meds   Irregular heart beat    normal eval by Dr. Mayford Knife in past   Pancreatic lesion    Thyroid nodule     Past Surgical History:  Procedure Laterality Date   CESAREAN SECTION  1999   COLONOSCOPY     DILATATION & CURETTAGE/HYSTEROSCOPY WITH MYOSURE N/A 07/04/2015   Procedure: DILATATION & CURETTAGE/HYSTEROSCOPY WITH MYOSURE;  Surgeon: Ok Edwards, MD;  Location: [facility];  Service: Gynecology;  Laterality: N/A;   DILATION AND CURETTAGE OF UTERUS     x three   HYSTEROSCOPY WITH NOVASURE N/A 07/04/2015   Procedure: Novasure ;  Surgeon: Ok Edwards, MD;  Location: [facility];  Service: Gynecology;  Laterality: N/A;   WISDOM TOOTH EXTRACTION     x2    Family History  Problem Relation Age of Onset   Hypertension Mother    Pancreatic cancer Mother 91   Hyperlipidemia Father    Cancer Sister        thyroid   Hypertension Sister    Heart disease Maternal Grandmother    Alzheimer's disease Paternal Grandmother    Diabetes Neg Hx    Colon cancer Neg Hx    Rectal cancer Neg Hx    Stomach cancer Neg Hx     Esophageal cancer Neg Hx     Social History   Socioeconomic History   Marital status: Married    Spouse name: Not on file   Number of children: Not on file   Years of education: Not on file   Highest education level: Not on file  Occupational History   Occupation: works at [employer]  Tobacco Use   Smoking status: Never   Smokeless tobacco: Never  Vaping Use   Vaping status: Never Used  Substance and Sexual Activity   Alcohol use: Yes    Alcohol/week: 10.0 standard drinks of alcohol    Types: 10 Glasses of wine per week    Comment: 2 glasses of wine daily   Drug use: No   Sexual activity: Not Currently    Birth control/protection: Other-see comments    Comment:  vasectomy,des neg  Other Topics Concern   Not on file  Social History Narrative   Married, 3 children, 2 dogs.  Oldest graduated from [employer] (graduatd 01/2022), works in [place], lives in [place]. 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  Son is at [employer]   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      Reviewed 12/2022   Social Determinants of Health   Financial Resource Strain: Low Risk  (12/18/2022)   Overall Financial Resource Strain (CARDIA)    Difficulty of Paying Living Expenses: Not hard at all  Food Insecurity: No Food Insecurity (12/18/2022)   Hunger Vital Sign    Worried About Running Out of Food in the Last Year: Never true    Ran Out of Food in the Last Year: Never true  Transportation Needs: No Transportation Needs (12/18/2022)   PRAPARE - Administrator, Civil Service (Medical): No    Lack of Transportation (Non-Medical): No  Physical Activity: Sufficiently Active (12/18/2022)   Exercise Vital Sign    Days of Exercise per Week: 6 days    Minutes of Exercise per Session: 30 min  Stress: No Stress Concern Present (12/18/2022)   [employer] of Occupational Health - Occupational Stress Questionnaire     Feeling of Stress : Not at all  Social Connections: Unknown (06/26/2021)   Received from [facility]   Social Network    Social Network: Not on file  Intimate Partner Violence: Not At Risk (12/18/2022)   Humiliation, Afraid, Rape, and Kick questionnaire    Fear of Current or Ex-Partner: No    Emotionally Abused: No    Physically Abused: No    Sexually Abused: No      Review of systems: All other review of systems negative except as mentioned in the HPI.   Physical Exam: Vitals:   01/13/23 0947  BP: 122/68  Pulse: 68   Body mass index is 24.09 kg/m. Gen:      No acute distress HEENT:  sclera anicteric Abd:      soft, non-tender; no palpable masses, no distension Ext:    No edema Neuro: alert and oriented x 3 Psych: normal mood and affect  Data Reviewed:  Reviewed labs, radiology imaging, old records and pertinent past GI work up  Results      Latest Ref Rng & Units 01/13/2023   10:32 AM 12/18/2022    9:40 AM 11/22/2021    9:41 AM  Hepatic Function  Total Protein 6.0 - 8.3 g/dL 7.1  7.0  7.1   Albumin 3.5 - 5.2 g/dL 4.3  4.6  4.8   AST 0 - 37 U/L 40  144  37   ALT 0 - 35 U/L 35  86  21   Alk Phosphatase 39 - 117 U/L 51  83  79   Total Bilirubin 0.2 - 1.2 mg/dL 0.7  0.6  0.3   Bilirubin, Direct 0.0 - 0.3 mg/dL 0.2             Assessment and Plan/Recommendations:  57 year old very pleasant female with complaints of generalized abdominal bloating, discomfort, IBS with diarrhea and constipation and pancreatic cyst.  Mother recently diagnosed with stage III pancreatic cancer    Pancreatic Cyst Stable cyst identified on CT scan in June 2024. Patient anxious due to family history of pancreatic cancer. Discussed the limitations of screening and the importance  of symptom monitoring. -Order MRI for better visualization and follow-up of the cyst.  Will place recall for MRI/MRCP in June 2025.  Patient is concerned and does not want to wait for 2 years for  surveillance imaging -Continue monitoring for symptoms such as persistent abdominal pain, unexplained weight loss, and changes in bowel habits.  Elevated Liver Enzymes Previous high AST and ALT levels. Patient has stopped alcohol consumption for a month. -Order liver function tests today to assess current enzyme levels.  General Health Maintenance -Return visit in six months. -Continue monitoring for changes in weight, bowel habits, and heart rate. -Encourage patient to maintain regular primary care and advocate for her health.      The patient was provided an opportunity to ask questions and all were answered. The patient agreed with the plan and demonstrated an understanding of the instructions.  Iona Beard , MD    CC: Joselyn Arrow, MD

## 2023-01-13 NOTE — Patient Instructions (Addendum)
VISIT SUMMARY:  During today's visit, we discussed your concerns about your pancreatic cyst, especially in light of your mother's recent diagnosis of stage three pancreatic cancer. We also reviewed your recent decrease in resting heart rate, weight loss, and previous high liver enzyme levels. We emphasized the importance of proactive monitoring and maintaining regular primary care visits.  YOUR PLAN:  -PANCREATIC CYST: A pancreatic cyst is a fluid-filled sac within the pancreas. Given your family history, we will order an MRI to get a better look at the cyst and continue to monitor for any symptoms such as persistent abdominal pain, unexplained weight loss, and changes in bowel habits.  -ELEVATED LIVER ENZYMES: Elevated liver enzymes can indicate liver inflammation or damage. Since you have stopped drinking alcohol, we will recheck your liver function tests today to see if your enzyme levels have improved.  -GENERAL HEALTH MAINTENANCE: We will continue to monitor your weight, bowel habits, and heart rate. It is important to maintain regular primary care visits and advocate for your health.  INSTRUCTIONS:  Please schedule an MRI for your pancreatic cyst as soon as possible. We will also perform liver function tests today to check your enzyme levels. Follow up with [place] in six months for a routine visit, and continue to monitor for any changes in your symptoms.  Due to recent changes in healthcare laws, you may see the results of your imaging and laboratory studies on MyChart before your provider has had a chance to review them.  We understand that in some cases there may be results that are confusing or concerning to you. Not all laboratory results come back in the same time frame and the provider may be waiting for multiple results in order to interpret others.  Please give [place] 48 hours in order for your provider to thoroughly review all the results before contacting the office for clarification of your  results.    I appreciate the  opportunity to care for you  Thank You   Marsa Aris , MD

## 2023-01-27 ENCOUNTER — Other Ambulatory Visit (HOSPITAL_BASED_OUTPATIENT_CLINIC_OR_DEPARTMENT_OTHER): Payer: BC Managed Care – PPO

## 2023-04-10 ENCOUNTER — Ambulatory Visit: Payer: Self-pay | Admitting: Family Medicine

## 2023-04-10 NOTE — Telephone Encounter (Signed)
  Chief Complaint: sinus pressure  Symptoms: stopped up nose, teeth hurt, face hurts Frequency: constant   Disposition: [] ED /[] Urgent Care (no appt availability in office) / [x] Appointment(In office/virtual)/ []  [facility]/ [] Home Care/ [] Refused Recommended Disposition /[] [facility]/ []  Follow-up with PCP Additional Notes: Pt calling with sinus pressure and teeth/face hurt. Pt denies fever/SOB. Pt states everyone around her is sick with flu. Per protocol, pt to be seen within 3 days. No PCP appt available and pt didn't want to see other [facility] providers at different office tomorrow. Pt stated, 'If I get worse, ill go to urgent care. I'll also send Dr Lynelle Doctor a message." RN encouraged pt to get seen within 3 day window of being symptomatic for possibility of Tamiflu. RN gave care advice and pt verbalized understanding.                Copied from CRM [id number]. Topic: Clinical - Red Word Triage >> Apr 10, 2023  3:46 PM Alessandra Bevels wrote: Red Word that prompted transfer to Nurse Triage: Pt is calling to report congestion, and "unble to breathe" starting Tuesday. With no temperature. Please advise Reason for Disposition  [1] Using nasal washes and pain medicine > 24 hours AND [2] sinus pain (around cheekbone or eye) persists  Answer Assessment - Initial Assessment Questions 1. LOCATION: "Where does it hurt?"      Teeth to top of head  2. ONSET: "When did the sinus pain start?"  (e.g., hours, days)      Tuesday  3. SEVERITY: "How bad is the pain?"   (Scale 1-10; mild, moderate or severe)   - MILD (1-3): doesn't interfere with normal activities    - MODERATE (4-7): interferes with normal activities (e.g., work or school) or awakens from sleep   - SEVERE (8-10): excruciating pain and patient unable to do any normal activities        5-6 4. RECURRENT SYMPTOM: "Have you ever had sinus problems before?" If Yes, ask: "When was the last time?" and "What happened that  time?"      Yes  5. NASAL CONGESTION: "Is the nose blocked?" If Yes, ask: "Can you open it or must you breathe through your mouth?"     Yes at times  6. NASAL DISCHARGE: "Do you have discharge from your nose?" If so ask, "What color?"     Some  7. FEVER: "Do you have a fever?" If Yes, ask: "What is it, how was it measured, and when did it start?"      no 8. OTHER SYMPTOMS: "Do you have any other symptoms?" (e.g., sore throat, cough, earache, difficulty breathing)     Ears feel stopped up  Protocols used: Sinus Pain or Congestion-A-AH

## 2023-04-10 NOTE — Telephone Encounter (Signed)
 Pt was offered an appt with Vincenza Hews tomorrow and decline that she will go to the UC instead. Just FYI

## 2023-04-11 ENCOUNTER — Encounter: Payer: Self-pay | Admitting: Family Medicine

## 2023-04-11 DIAGNOSIS — J019 Acute sinusitis, unspecified: Secondary | ICD-10-CM | POA: Diagnosis not present

## 2023-05-06 ENCOUNTER — Other Ambulatory Visit: Payer: Self-pay | Admitting: Family Medicine

## 2023-05-06 DIAGNOSIS — Z1231 Encounter for screening mammogram for malignant neoplasm of breast: Secondary | ICD-10-CM

## 2023-05-08 ENCOUNTER — Other Ambulatory Visit: Payer: Self-pay

## 2023-05-08 DIAGNOSIS — R7401 Elevation of levels of liver transaminase levels: Secondary | ICD-10-CM

## 2023-05-08 DIAGNOSIS — K769 Liver disease, unspecified: Secondary | ICD-10-CM

## 2023-05-22 ENCOUNTER — Other Ambulatory Visit: Payer: Self-pay

## 2023-06-05 ENCOUNTER — Ambulatory Visit

## 2023-06-09 ENCOUNTER — Ambulatory Visit

## 2023-06-10 ENCOUNTER — Ambulatory Visit

## 2023-06-12 ENCOUNTER — Ambulatory Visit

## 2023-06-13 ENCOUNTER — Ambulatory Visit
Admission: RE | Admit: 2023-06-13 | Discharge: 2023-06-13 | Disposition: A | Discharge status: Home / Self Care | Source: Ambulatory Visit | Attending: Family Medicine | Admitting: Family Medicine

## 2023-06-13 DIAGNOSIS — Z1231 Encounter for screening mammogram for malignant neoplasm of breast: Secondary | ICD-10-CM

## 2023-06-25 ENCOUNTER — Encounter: Payer: Self-pay | Admitting: *Deleted

## 2023-07-16 DIAGNOSIS — S838X2A Sprain of other specified parts of left knee, initial encounter: Secondary | ICD-10-CM | POA: Diagnosis not present

## 2023-07-18 DIAGNOSIS — M25562 Pain in left knee: Secondary | ICD-10-CM | POA: Diagnosis not present

## 2023-07-24 ENCOUNTER — Encounter: Payer: Self-pay | Admitting: Gastroenterology

## 2023-07-24 ENCOUNTER — Encounter: Payer: Self-pay | Admitting: Family Medicine

## 2023-07-24 DIAGNOSIS — M25562 Pain in left knee: Secondary | ICD-10-CM | POA: Diagnosis not present

## 2023-08-11 DIAGNOSIS — S83412A Sprain of medial collateral ligament of left knee, initial encounter: Secondary | ICD-10-CM | POA: Diagnosis not present

## 2023-08-12 ENCOUNTER — Ambulatory Visit: Admitting: Gastroenterology

## 2023-08-26 DIAGNOSIS — S83412D Sprain of medial collateral ligament of left knee, subsequent encounter: Secondary | ICD-10-CM | POA: Diagnosis not present

## 2023-09-05 ENCOUNTER — Ambulatory Visit: Payer: Self-pay

## 2023-09-05 NOTE — Telephone Encounter (Signed)
 FYI Only or Action Required?: FYI only for provider.  Patient was last seen in primary care on 12/18/2022 by Randol Dawes, MD.  Called Nurse Triage reporting Heart Problem.  Symptoms began several weeks ago.  Interventions attempted: Rest, hydration, or home remedies.  Symptoms are: unchanged.  Triage Disposition: See PCP Within 2 Weeks  Patient/caregiver understands and will follow disposition?: Yes                             Copied from CRM [id number]. Topic: Clinical - Red Word Triage >> Sep 05, 2023  4:09 PM Deborah Meadows wrote: Red Word that prompted transfer to Nurse Triage: Patient is calling to report that her heart has been racing for a couple of week intermittial. Requesting referral to Dr. Lynwood Lipps Reason for Disposition  Problems with anxiety or stress  Answer Assessment - Initial Assessment Questions 1. DESCRIPTION: Please describe your heart rate or heartbeat that you are having (e.g., fast/slow, regular/irregular, skipped or extra beats, palpitations)     Racing HR 2. ONSET: When did it start? (e.g., minutes, hours, days)      4-6 weeks 3. DURATION: How long does it last (e.g., seconds, minutes, hours)     Sometimes hours, states today it did not happen at all, sometimes it can last all morning, states maybe this is correlated with caffeine 4. PATTERN Does it come and go, or has it been constant since it started?  Does it get worse with exertion?   Are you feeling it now?     States racing HR does not happen every single day, states she forgets about it at times 6. HEART RATE: Can you tell me your heart rate? How many beats in 15 seconds?  Note: Not all patients can do this.       82 right now at rest, states HR is over 100 at times in the mornings when she walks downstairs, states resting HR is typically mid 60s 7. RECURRENT SYMPTOM: Have you ever had this before? If Yes, ask: When was the last time? and What happened that  time?      Denies 8. CAUSE: What do you think is causing the palpitations?     Unsure 9. CARDIAC HISTORY: Do you have any history of heart disease? (e.g., heart attack, angina, bypass surgery, angioplasty, arrhythmia)      High cholesterol  10. OTHER SYMPTOMS: Do you have any other symptoms? (e.g., dizziness, chest pain, sweating, difficulty breathing)       States it feels like her heart is vibrating, denies difficulty breathing, denies chest pain, denies dizziness, denies lightheadedness, states she very occasionally feels lightheaded when getting up after a workout, states she is making moderations to her workouts as a precaution, states increased HR may be panic related    Offered first available appointment in office. Patient declined and stated she only wanted to see PCP. Scheduled for first available appointment with PCP (next Thursday).  Protocols used: Heart Rate and Heartbeat Questions-A-AH

## 2023-09-05 NOTE — Telephone Encounter (Signed)
 Called patient and offered appt with Dr Randol on Monday, she is only available after 1:00, offered with Ludie in afternoon. She is only available after 1:00 all week. I advised her I would call if cancellation in afternoon with Dr Randol or advised her to call back if she changes her mind and wants to see another provider

## 2023-09-10 NOTE — Progress Notes (Unsigned)
 No chief complaint on file.  Patient presents for evaluation of racing heart/tachycardia, starting about 4-6 weeks ago.  Per triage nurse: It can sometimes last for hours.  Sometimes it lasts all morning. Unsure if related to caffeine. States it feels like her heart is vibrating. Denies difficulty breathing, denies chest pain, dizziness. She very occasionally feels lightheaded when getting up after a workout States increased HR may be panic related     Lab Results  Component Value Date   TSH 1.900 12/18/2022     Elevated LFT's: Elevated in 12/2022, improved on recheck in 01/2023. Seeing Dr. Nandigam. Advised to have rechecked in 3-6 months.   Component Ref Range & Units (hover) 8 mo ago (01/13/23) 8 mo ago (12/18/22) 1 yr ago (11/22/21) 2 yr ago (06/22/21) 2 yr ago (11/14/20) 3 yr ago (11/11/19) 4 yr ago (10/28/18)  Total Bilirubin 0.7 0.6 R 0.3 R 0.7 R 0.3 R 0.5 R 0.7 R  Bilirubin, Direct 0.2        Alkaline Phosphatase 51 83 R 79 R 55 R 68 R 65 R, CM 59 R  AST 40 High  144 High  R 37 R 36 R 45 High  R 33 R 33 R  ALT 35 86 High  R 21 R 18 R 23 R 21 R 14     Hyperlipidemia:  Calcium  score of 0 in 12/2022.  Lab Results  Component Value Date   CHOL 285 (H) 12/18/2022   HDL 101 12/18/2022   LDLCALC 175 (H) 12/18/2022   TRIG 60 12/18/2022   CHOLHDL 2.8 12/18/2022     PMH, PSH, SH reviewed   ROS:    PHYSICAL EXAM:  LMP 07/04/2015   Wt Readings from Last 3 Encounters:  01/13/23 136 lb (61.7 kg)  12/18/22 140 lb 9.6 oz (63.8 kg)  09/19/22 142 lb (64.4 kg)       ASSESSMENT/PLAN:   Never got repeat LFTs for Dr. Shila as recommended (3-6 f/u from December) We can do today if she wants (and send report to Dr. Shila)

## 2023-09-11 ENCOUNTER — Other Ambulatory Visit: Payer: Self-pay | Admitting: *Deleted

## 2023-09-11 ENCOUNTER — Ambulatory Visit: Admitting: Family Medicine

## 2023-09-11 ENCOUNTER — Encounter: Payer: Self-pay | Admitting: Family Medicine

## 2023-09-11 VITALS — BP 130/80 | HR 76 | Ht 63.0 in | Wt 128.6 lb

## 2023-09-11 DIAGNOSIS — R000 Tachycardia, unspecified: Secondary | ICD-10-CM | POA: Diagnosis not present

## 2023-09-11 DIAGNOSIS — E041 Nontoxic single thyroid nodule: Secondary | ICD-10-CM

## 2023-09-11 DIAGNOSIS — R002 Palpitations: Secondary | ICD-10-CM | POA: Diagnosis not present

## 2023-09-11 DIAGNOSIS — F4322 Adjustment disorder with anxiety: Secondary | ICD-10-CM

## 2023-09-11 DIAGNOSIS — D7589 Other specified diseases of blood and blood-forming organs: Secondary | ICD-10-CM | POA: Diagnosis not present

## 2023-09-11 DIAGNOSIS — K760 Fatty (change of) liver, not elsewhere classified: Secondary | ICD-10-CM | POA: Diagnosis not present

## 2023-09-11 MED ORDER — BUSPIRONE HCL 15 MG PO TABS: ORAL_TABLET | ORAL | 0 refills | Status: DC

## 2023-09-11 NOTE — Patient Instructions (Addendum)
 I encourage you to think about counseling (for anxiety/stress and grief). Consider grief counseling through [facility]).  We discussed Buspar  for anxiety. Other medication options would include SSRI and SNRIs but you reported not being willing to consider these medications at this time.  Buspar --we are sending in 15 mg tablets. Start at 1/3 of a pill once or twice a day. If you have no side effects, I recommend increasing it to 1/2 pill twice daily. After a week, if not side effects, you can increase the dose to 2/3 tablet twice a day (or slower, if you prefer), and gradually increase (by up to 5 mg each week)  to a full pill twice daily. We can further increase if helping some and you are tolerating well, but need more help.  I think the buspar  in addition to counseling, while further evaluating your heart is a good way to go.  We discussed trying plain mucinex (not the D or DM) in case any of your vibration sense could be related to mucus in the trachea or bronchial tubes (which is also in the chest, where you feel the vibration).

## 2023-09-12 ENCOUNTER — Ambulatory Visit: Payer: Self-pay | Admitting: Family Medicine

## 2023-09-12 LAB — CBC WITH DIFFERENTIAL/PLATELET
Basophils Absolute: 0.1 x10E3/uL (ref 0.0–0.2)
Basos: 1 %
EOS (ABSOLUTE): 0 x10E3/uL (ref 0.0–0.4)
Eos: 0 %
Hematocrit: 37.4 % (ref 34.0–46.6)
Hemoglobin: 12.5 g/dL (ref 11.1–15.9)
Immature Grans (Abs): 0 x10E3/uL (ref 0.0–0.1)
Immature Granulocytes: 0 %
Lymphocytes Absolute: 1.3 x10E3/uL (ref 0.7–3.1)
Lymphs: 25 %
MCH: 33.4 pg — ABNORMAL HIGH (ref 26.6–33.0)
MCHC: 33.4 g/dL (ref 31.5–35.7)
MCV: 100 fL — ABNORMAL HIGH (ref 79–97)
Monocytes Absolute: 0.6 x10E3/uL (ref 0.1–0.9)
Monocytes: 11 %
Neutrophils Absolute: 3.2 x10E3/uL (ref 1.4–7.0)
Neutrophils: 63 %
Platelets: 256 x10E3/uL (ref 150–450)
RBC: 3.74 x10E6/uL — ABNORMAL LOW (ref 3.77–5.28)
RDW: 12.4 % (ref 11.7–15.4)
WBC: 5.1 x10E3/uL (ref 3.4–10.8)

## 2023-09-12 LAB — COMPREHENSIVE METABOLIC PANEL WITH GFR
ALT: 40 IU/L — AB (ref 0–32)
AST: 72 IU/L — AB (ref 0–40)
Albumin: 4.6 g/dL (ref 3.8–4.9)
Alkaline Phosphatase: 71 IU/L (ref 44–121)
BUN/Creatinine Ratio: 13 (ref 9–23)
BUN: 9 mg/dL (ref 6–24)
Bilirubin Total: 0.3 mg/dL (ref 0.0–1.2)
CO2: 21 mmol/L (ref 20–29)
Calcium: 9.7 mg/dL (ref 8.7–10.2)
Chloride: 100 mmol/L (ref 96–106)
Creatinine, Ser: 0.69 mg/dL (ref 0.57–1.00)
Globulin, Total: 2.4 g/dL (ref 1.5–4.5)
Glucose: 73 mg/dL (ref 70–99)
Potassium: 4.1 mmol/L (ref 3.5–5.2)
Sodium: 139 mmol/L (ref 134–144)
Total Protein: 7 g/dL (ref 6.0–8.5)
eGFR: 100 mL/min/1.73 (ref >59)

## 2023-09-12 LAB — TSH: TSH: 1.53 u[IU]/mL (ref 0.450–4.500)

## 2023-09-15 LAB — SPECIMEN STATUS REPORT

## 2023-09-15 LAB — VITAMIN B12: Vitamin B-12: 526 pg/mL (ref 232–1245)

## 2023-09-16 ENCOUNTER — Ambulatory Visit
Admission: RE | Admit: 2023-09-16 | Discharge: 2023-09-16 | Disposition: A | Discharge status: Home / Self Care | Source: Ambulatory Visit | Attending: Family Medicine | Admitting: Family Medicine

## 2023-09-16 DIAGNOSIS — E041 Nontoxic single thyroid nodule: Secondary | ICD-10-CM

## 2023-09-19 ENCOUNTER — Ambulatory Visit: Payer: Self-pay | Admitting: Family Medicine

## 2023-09-22 ENCOUNTER — Encounter: Payer: Self-pay | Admitting: Gastroenterology

## 2023-09-23 DIAGNOSIS — H5213 Myopia, bilateral: Secondary | ICD-10-CM | POA: Diagnosis not present

## 2023-09-23 DIAGNOSIS — H52203 Unspecified astigmatism, bilateral: Secondary | ICD-10-CM | POA: Diagnosis not present

## 2023-09-23 DIAGNOSIS — H10413 Chronic giant papillary conjunctivitis, bilateral: Secondary | ICD-10-CM | POA: Diagnosis not present

## 2023-09-24 ENCOUNTER — Ambulatory Visit (HOSPITAL_COMMUNITY)
Admission: RE | Admit: 2023-09-24 | Discharge: 2023-09-24 | Disposition: A | Discharge status: Home / Self Care | Source: Ambulatory Visit | Attending: Cardiology | Admitting: Cardiology

## 2023-09-24 DIAGNOSIS — R000 Tachycardia, unspecified: Secondary | ICD-10-CM | POA: Diagnosis not present

## 2023-09-24 DIAGNOSIS — I349 Nonrheumatic mitral valve disorder, unspecified: Secondary | ICD-10-CM | POA: Diagnosis not present

## 2023-09-24 DIAGNOSIS — S83412D Sprain of medial collateral ligament of left knee, subsequent encounter: Secondary | ICD-10-CM | POA: Diagnosis not present

## 2023-09-24 LAB — ECHOCARDIOGRAM COMPLETE
AR max vel: 2.78 cm2
AV Area VTI: 2.81 cm2
AV Area mean vel: 2.74 cm2
AV Mean grad: 3 mmHg
AV Peak grad: 5.8 mmHg
Ao pk vel: 1.2 m/s
Area-P 1/2: 3.19 cm2
S' Lateral: 2.7 cm

## 2023-09-25 ENCOUNTER — Encounter: Payer: Self-pay | Admitting: Nurse Practitioner

## 2023-09-25 ENCOUNTER — Ambulatory Visit (INDEPENDENT_AMBULATORY_CARE_PROVIDER_SITE_OTHER): Admitting: Nurse Practitioner

## 2023-09-25 VITALS — BP 116/64 | HR 75 | Ht 62.5 in | Wt 126.0 lb

## 2023-09-25 DIAGNOSIS — Z780 Asymptomatic menopausal state: Secondary | ICD-10-CM

## 2023-09-25 DIAGNOSIS — Z1331 Encounter for screening for depression: Secondary | ICD-10-CM | POA: Diagnosis not present

## 2023-09-25 DIAGNOSIS — Z01419 Encounter for gynecological examination (general) (routine) without abnormal findings: Secondary | ICD-10-CM | POA: Diagnosis not present

## 2023-09-25 NOTE — Progress Notes (Signed)
Deborah Meadows March 31, 1965 [id number]   History:  58 y.o. [id number] presents for annual exam.  Postmenopausal - no HRT, no bleeding. 2019/2020 ASCUS negative HPV. PCP manages HLD.   Gynecologic History Contraception/Family planning: post menopausal status Sexually active: No  Health Maintenance Last Pap: 02/21/2021. Results were: Normal neg HPV Last mammogram: 06/13/2023. Results were: Normal Last colonoscopy: 03/24/2019. Results were: Normal, 5-year recall Last Dexa: Never     09/25/2023   12:05 PM  Depression screen PHQ 2/9  Decreased Interest 0  Down, Depressed, Hopeless 0  PHQ - 2 Score 0     Past medical history, past surgical history, family history and social history were all reviewed and documented in the EPIC chart. Married. Works for [employer]. Oldest daughter lives in [place]. Youngest daughter living in [place] as Au Pair. Son at [employer].   ROS:  A ROS was performed and pertinent positives and negatives are included.  Exam:  Vitals:   09/25/23 1156  BP: 116/64  Pulse: 75  SpO2: 98%  Weight: 126 lb (57.2 kg)  Height: 5' 2.5 (1.588 m)     Body mass index is 22.68 kg/m.  General appearance:  Normal Thyroid:  Symmetrical, normal in size, without palpable masses or nodularity. Respiratory  Auscultation:  Clear without wheezing or rhonchi Cardiovascular  Auscultation:  Regular rate, without rubs, murmurs or gallops  Edema/varicosities:  Not grossly evident Abdominal  Soft,nontender, without masses, guarding or rebound.  Liver/spleen:  No organomegaly noted  Hernia:  None appreciated  Skin  Inspection:  Grossly normal   Breasts: Examined lying and sitting.   Right: Without masses, retractions, discharge or axillary adenopathy.   Left: Without masses, retractions, discharge or axillary adenopathy. Pelvic: External genitalia:  no lesions              Urethra:  normal appearing urethra with no masses, tenderness or lesions              Bartholins and Skenes: normal                  Vagina: normal appearing vagina with normal color and discharge, no lesions. Atrophic changes.               Cervix: no lesions Bimanual Exam:  Uterus:  no masses or tenderness              Adnexa: no mass, fullness, tenderness              Rectovaginal: Deferred              Anus:  normal, no lesions  Zada Louder, CMA present as chaperone.   Assessment/Plan:  58 y.o. H3E9966 for annual exam.   Well female exam with routine gynecological exam - Education provided on SBEs, importance of preventative screenings, current guidelines, high calcium diet, regular exercise, and multivitamin daily.  Labs with PCP.   Postmenopausal - no HRT, no bleeding  Screening for cervical cancer - 2019/2020 ASCUS negative HPV. Discuss current guidelines of 5-year interval. Would like pap next year at 3-year interval.   Screening for breast cancer - Normal mammogram history.  Continue annual screenings.  Normal breast exam today.  Screening for colon cancer - 2021 colonoscopy.  Will repeat at 5-year interval per GI recommendation.   Screening for osteoporosis - Average risk. Will plan for DXA at age 53.   Return in about 1 year (around 09/24/2024) for Annual.     Deborah Meadows [facility], 12:23 PM  09/25/2023  

## 2023-09-29 DIAGNOSIS — D2262 Melanocytic nevi of left upper limb, including shoulder: Secondary | ICD-10-CM | POA: Diagnosis not present

## 2023-09-29 DIAGNOSIS — D692 Other nonthrombocytopenic purpura: Secondary | ICD-10-CM | POA: Diagnosis not present

## 2023-09-29 DIAGNOSIS — D225 Melanocytic nevi of trunk: Secondary | ICD-10-CM | POA: Diagnosis not present

## 2023-09-29 DIAGNOSIS — L720 Epidermal cyst: Secondary | ICD-10-CM | POA: Diagnosis not present

## 2023-10-12 ENCOUNTER — Other Ambulatory Visit: Payer: Self-pay | Admitting: Family Medicine

## 2023-10-12 DIAGNOSIS — F4322 Adjustment disorder with anxiety: Secondary | ICD-10-CM

## 2023-10-15 DIAGNOSIS — R000 Tachycardia, unspecified: Secondary | ICD-10-CM | POA: Diagnosis not present

## 2023-10-15 DIAGNOSIS — R002 Palpitations: Secondary | ICD-10-CM | POA: Diagnosis not present

## 2023-10-16 ENCOUNTER — Encounter: Payer: Self-pay | Admitting: Family Medicine

## 2023-10-16 ENCOUNTER — Ambulatory Visit: Payer: Self-pay

## 2023-10-16 DIAGNOSIS — R040 Epistaxis: Secondary | ICD-10-CM

## 2023-10-16 NOTE — Telephone Encounter (Signed)
 FYI Only or Action Required?: FYI only for provider.  Patient was last seen in primary care on 09/11/2023 by Randol Dawes, MD.  Called Nurse Triage reporting Epistaxis.  Symptoms began today.  Interventions attempted: Other: held pressure to her nose.  Symptoms are: unchanged.  Triage Disposition: Go to ED Now (Notify PCP)-patient upset that she couldn't be seen in the office at time of call. Patient hung up,  Patient/caregiver understands and will follow disposition?: Unsure  Copied from CRM [id number]. Topic: Clinical - Red Word Triage >> Oct 16, 2023  4:40 PM Teressa P wrote: Red Word that prompted transfer to Nurse Triage: pt called with nose bleed for about an a hour with out being able stop. Reason for Disposition  [1] Bleeding present > 30 minutes AND [2] using correct method of direct pressure  Answer Assessment - Initial Assessment Questions 1. AMOUNT OF BLEEDING: How bad is the bleeding? How much blood was lost? Has the bleeding stopped?     Bleeding won't stop-patient reports a steady stream of blood.  2. ONSET: When did the nosebleed start?      Started about 45 minutes to an hour 3. FREQUENCY: How many nosebleeds have you had in the last 24 hours?      1 nosebleed but has had five nosebleeds within 8 days 4. RECURRENT SYMPTOMS: Have there been other recent nosebleeds? If Yes, ask: How long did it take you to stop the bleeding? What worked best?      Conservation officer, historic buildings nosebleed states lasted 1 to 1.5 hours.  5. CAUSE: What do you think caused this nosebleed?     Unsure-patient states she has been having a lot of congestion.  6. LOCAL FACTORS: Do you have any cold symptoms?, Have you been rubbing or picking at your nose?     no 7. SYSTEMIC FACTORS: Do you have high blood pressure or any bleeding problems?     no 8. BLOOD THINNERS: Do you take any blood thinners? (e.g., aspirin, clopidogrel / Plavix, coumadin, heparin). Notes: Other strong blood thinners  include: Arixtra (fondaparinux), Eliquis (apixaban), Pradaxa (dabigatran), and Xarelto (rivaroxaban).     no 9. OTHER SYMPTOMS: Do you have any other symptoms? (e.g., lightheadedness)     Patient states no. 10. PREGNANCY: Is there any chance you are pregnant? When was your last menstrual period?       no  Protocols used: Nosebleed-A-AH

## 2023-10-21 DIAGNOSIS — R040 Epistaxis: Secondary | ICD-10-CM | POA: Diagnosis not present

## 2023-10-27 ENCOUNTER — Other Ambulatory Visit: Payer: Self-pay | Admitting: Family Medicine

## 2023-10-27 MED ORDER — TRIAMCINOLONE ACETONIDE 0.1 % EX CREA: TOPICAL_CREAM | Freq: Two times a day (BID) | CUTANEOUS | 0 refills | Status: AC | PRN

## 2023-10-27 NOTE — Telephone Encounter (Signed)
 Copied from CRM [id number]. Topic: Clinical - Medication Refill >> Oct 27, 2023  2:27 PM Fredrica W wrote: Medication: Eczema cream   Has the patient contacted their pharmacy? No (Agent: If no, request that the patient contact the pharmacy for the refill. If patient does not wish to contact the pharmacy document the reason why and proceed with request.) (Agent: If yes, when and what did the pharmacy advise?)  This is the patient's preferred pharmacy:  [id number] [place], [place] - [address] [place] [address] Phone: [phone] Fax: [phone]  Is this the correct pharmacy for this prescription? Yes If no, delete pharmacy and type the correct one.   Has the prescription been filled recently? No  Is the patient out of the medication? Yes  Has the patient been seen for an appointment in the last year OR does the patient have an upcoming appointment? Yes  Can we respond through MyChart? No  Agent: Please be advised that Rx refills may take up to 3 business days. We ask that you follow-up with your pharmacy.

## 2023-10-27 NOTE — Telephone Encounter (Signed)
 Forwarding

## 2023-11-04 ENCOUNTER — Institutional Professional Consult (permissible substitution) (INDEPENDENT_AMBULATORY_CARE_PROVIDER_SITE_OTHER)

## 2023-11-07 ENCOUNTER — Other Ambulatory Visit (INDEPENDENT_AMBULATORY_CARE_PROVIDER_SITE_OTHER)

## 2023-11-07 DIAGNOSIS — R7401 Elevation of levels of liver transaminase levels: Secondary | ICD-10-CM | POA: Diagnosis not present

## 2023-11-07 DIAGNOSIS — K769 Liver disease, unspecified: Secondary | ICD-10-CM

## 2023-11-07 LAB — HEPATIC FUNCTION PANEL
ALT: 28 U/L (ref 0–35)
AST: 60 U/L — ABNORMAL HIGH (ref 0–37)
Albumin: 4.7 g/dL (ref 3.5–5.2)
Alkaline Phosphatase: 49 U/L (ref 39–117)
Bilirubin, Direct: 0.2 mg/dL (ref 0.0–0.3)
Total Bilirubin: 1 mg/dL (ref 0.2–1.2)
Total Protein: 7.4 g/dL (ref 6.0–8.3)

## 2023-11-11 DIAGNOSIS — R002 Palpitations: Secondary | ICD-10-CM | POA: Diagnosis not present

## 2023-11-12 ENCOUNTER — Telehealth: Payer: Self-pay | Admitting: Gastroenterology

## 2023-11-12 NOTE — Telephone Encounter (Signed)
 Inbound call from patient stating that she had spoke with [facility] back in September about an appointment on 10/6 at 2:30. She states she did not see the appointment posted in her Mychart and wanted to make sure she was scheduled. I advised patient that I seen the messages in Mychart about the appointment but nothing scheduled for her. I advised her that it looked like it was  tentatively scheduled. She requested that I send a message letting the office know she would be here on 10/6 at 2:30 regardless if the appointment was made or not. She is unhappy with the care she is receiving. I advised I was sorry and that I would have the nurse return her call in the morning. Please advise.

## 2023-11-13 ENCOUNTER — Ambulatory Visit: Payer: Self-pay | Admitting: Gastroenterology

## 2023-11-17 ENCOUNTER — Encounter: Payer: Self-pay | Admitting: Gastroenterology

## 2023-11-17 ENCOUNTER — Ambulatory Visit (INDEPENDENT_AMBULATORY_CARE_PROVIDER_SITE_OTHER): Admitting: Gastroenterology

## 2023-11-17 VITALS — BP 136/80 | HR 72 | Ht 62.75 in | Wt 129.2 lb

## 2023-11-17 DIAGNOSIS — K862 Cyst of pancreas: Secondary | ICD-10-CM | POA: Diagnosis not present

## 2023-11-17 DIAGNOSIS — K769 Liver disease, unspecified: Secondary | ICD-10-CM

## 2023-11-17 DIAGNOSIS — F1090 Alcohol use, unspecified, uncomplicated: Secondary | ICD-10-CM | POA: Diagnosis not present

## 2023-11-17 DIAGNOSIS — R7401 Elevation of levels of liver transaminase levels: Secondary | ICD-10-CM

## 2023-11-17 DIAGNOSIS — E739 Lactose intolerance, unspecified: Secondary | ICD-10-CM

## 2023-11-17 DIAGNOSIS — R748 Abnormal levels of other serum enzymes: Secondary | ICD-10-CM | POA: Diagnosis not present

## 2023-11-17 DIAGNOSIS — R634 Abnormal weight loss: Secondary | ICD-10-CM

## 2023-11-17 DIAGNOSIS — R140 Abdominal distension (gaseous): Secondary | ICD-10-CM

## 2023-11-17 NOTE — Progress Notes (Signed)
 Deborah Meadows    [id number]    1965/11/17  Primary Care Physician:Knapp, Annabelle, MD  Referring Physician: Randol Annabelle, MD [address] [place],  [place] [address]   Chief complaint:  Pancreas cyst , abnormal LFT  Discussed the use of AI scribe software for clinical note transcription with the patient, who gave verbal consent to proceed.  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  Pancreatic cyst surveillance - Pancreatic cyst measured at 0.6 to 0.7 cm on three CT scans over the past two years - Concern for malignant transformation due to family history of pancreatic cancer (mother deceased from pancreatic cancer) - Interest in further imaging for ongoing monitoring  Unintentional weight loss - Weight decreased from 142 pounds in August of the previous year to 128 pounds in July of this year - Attributes some weight loss to grief following her mother's death ten months ago - Maintains meal intake despite weight loss  Gastrointestinal symptoms and lactose intolerance - Gastrointestinal discomfort with milk consumption, suspected due to lactose intolerance - Reduced milk intake to avoid symptoms - Uses lactose-free milk (e.g., Fairlife) to mitigate symptoms of urgency and gastrointestinal discomfort  Alcohol use and liver health - Consumes a couple of glasses of wine nightly, a habit started after her mother's funeral - Concern about liver health due to elevated AST (increased by approximately thirty points) - Aware of the potential impact of alcohol on liver function     Latest Ref Rng & Units 11/07/2023   12:01 PM 09/11/2023    4:04 PM 01/13/2023   10:32 AM  Hepatic Function  Total Protein 6.0 - 8.3 g/dL 7.4  7.0  7.1   Albumin 3.5 - 5.2 g/dL 4.7  4.6  4.3   AST 0 - 37 U/L 60  72  40   ALT 0 - 35 U/L 28  40  35   Alk Phosphatase 39 -  117 U/L 49  71  51   Total Bilirubin 0.2 - 1.2 mg/dL 1.0  0.3  0.7   Bilirubin, Direct 0.0 - 0.3 mg/dL 0.2   0.2         She had CT abdomen pelvis with and without contrast [facility] on July 18, 2022 1.  Nonspecific 7 mm cystic lesion of the head of the pancreas not significantly changed from previous exams dating back to June 2023. Recommendations vary for follow-up although follow-up for a total of 10 years is often recommended. Additional follow-up exams possibly with contrasted MRI could be obtained in 2 years, 5 years and 9 years as indicated.  2.  Shotty nonspecific lymph nodes of the mesentery and retroperitoneum possibly reactive in nature.  3.  Mild wall thickening and stranding of the bladder possibly due to incomplete distention although acute or chronic cystitis could have such an appearance.  4.  Fibroid uterus.        TTG IgA antibody negative for celiac disease Fecal elastase normal Fecal lactoferrin negative Normal fecal fat   EGD 02/20/2022 - LA Grade B reflux esophagitis with no bleeding.  - Benign-appearing esophageal stenosis - 2 cm hiatal hernia.  - Non-bleeding gastric ulcer with no stigmata of bleeding. Biopsied.  - Gastritis. Biopsied.  - Normal examined duodenum   Pathology 02/20/2022 1. Surgical [P], gastric antrum and gastric body bx -  ANTRAL AND OXYNTIC TYPE MUCOSAE WITH REACTIVE (CHEMICAL) GASTROPATHY - NEGATIVE FOR HELICOBACTER ORGANISMS ON H&E STAIN - NEGATIVE FOR DYSPLASIA OR MALIGNANCY 2. Surgical [P], gastric ulcer bx - ANTRAL MUCOSA WITH MARKED REACTIVE (CHEMICAL) GASTROPATHY AND FOCAL EROSION - IMMUNOHISTOCHEMICAL STAIN FOR HELICOBACTER ORGANISMS IS NEGATIVE - NEGATIVE FOR DYSPLASIA OR MALIGNANCY   Colonoscopy February 2021 - Diverticulosis in the sigmoid colon. - Non-bleeding internal hemorrhoids. - The examination was otherwise normal. - No specimens collected   Colonoscopy January 2018 - One 13 mm polyp in the cecum, removed with a hot  snare. Resected and retrieved.  Tubular adenoma - Non-bleeding internal hemorrhoids. - The examination was otherwise normal.   CT abd & pelvis 07/24/21 1. No acute process identified. 2. 6 mm hypodense likely cystic lesion in the head of the pancreas. Recommend initial follow-up contrast-enhanced MRI abdomen or pancreas protocol CT in 6 months. 3. Colonic diverticulosis. 4. Fibroid uterus.   CT abdomen pelvis September 28, 2021 at [facility] Stable 6 mm cyst in the pancreatic head.  Recommended repeat CT or MRI in 12 months    Outpatient Encounter Medications as of 11/17/2023  Medication Sig   busPIRone  (BUSPAR ) 15 MG tablet Take as directed by MD, 1/2 tablet twice daily and titrate up as directed (up to full pill twice daily if needed)   Cholecalciferol (VITAMIN D ) 50 MCG (2000 UT) tablet Take 2,000 Units by mouth daily.   meclizine  (ANTIVERT ) 25 MG tablet Take 1 tablet (25 mg total) by mouth 3 (three) times daily as needed for dizziness.   triamcinolone  cream (KENALOG ) 0.1 % Apply topically 2 (two) times daily as needed (rash). Do not use for longer than 2 weeks   No facility-administered encounter medications on file as of 11/17/2023.    Allergies as of 11/17/2023 - Review Complete 09/25/2023  Allergen Reaction Noted   Penicillins Hives and Itching     Past Medical History:  Diagnosis Date   Allergy    Anemia    borderline   Blood dyscrasia    had test 18 years ago. No diagnosis   Hyperlipidemia    no meds   Irregular heart beat    normal eval by Dr. Shlomo in past   Pancreatic lesion    Thyroid  nodule     Past Surgical History:  Procedure Laterality Date   CESAREAN SECTION  1999   COLONOSCOPY     DILATATION & CURETTAGE/HYSTEROSCOPY WITH MYOSURE N/A 07/04/2015   Procedure: DILATATION & CURETTAGE/HYSTEROSCOPY WITH MYOSURE;  Surgeon: Curlee VEAR Guan, MD;  Location: [facility];  Service: Gynecology;  Laterality: N/A;   DILATION AND CURETTAGE OF UTERUS     x three    HYSTEROSCOPY WITH NOVASURE N/A 07/04/2015   Procedure: Novasure ;  Surgeon: Curlee VEAR Guan, MD;  Location: [facility];  Service: Gynecology;  Laterality: N/A;   WISDOM TOOTH EXTRACTION     x2    Family History  Problem Relation Age of Onset   Hypertension Mother    Pancreatic cancer Mother 70   Hyperlipidemia Father    Cancer Sister        thyroid    Hypertension Sister    Heart disease Maternal Grandmother    Alzheimer's disease Paternal Grandmother    Diabetes Neg Hx    Colon cancer Neg Hx    Rectal cancer Neg Hx    Stomach cancer Neg Hx    Esophageal cancer Neg Hx     Social History   Socioeconomic History   Marital  status: Married    Spouse name: Not on file   Number of children: Not on file   Years of education: Not on file   Highest education level: Not on file  Occupational History   Occupation: works at [employer]  Tobacco Use   Smoking status: Never   Smokeless tobacco: Never  Vaping Use   Vaping status: Never Used  Substance and Sexual Activity   Alcohol use: Not Currently    Comment: 2 glasses of wine daily   Drug use: No   Sexual activity: Not Currently    Partners: Male    Birth control/protection: Other-see comments    Comment: vasectomy,des neg  Other Topics Concern   Not on file  Social History Narrative   Married, 3 children, 2 dogs.  Oldest graduated from [employer] (graduatd 01/2022), works in [place], lives in [place]. 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  Son is at [employer].   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      Reviewed 12/2022   Social Drivers of Health   Financial Resource Strain: Low Risk  (12/18/2022)   Overall Financial Resource Strain (CARDIA)    Difficulty of Paying Living Expenses: Not hard at all  Food Insecurity: Low Risk  (10/01/2023)   Received from [facility]   Hunger Vital Sign    Within the past 12 months, you worried that your food  would run out before you got money to buy more: Never true    Within the past 12 months, the food you bought just didn't last and you didn't have money to get more. : Never true  Transportation Needs: No Transportation Needs (10/01/2023)   Received from [facility]    In the past 12 months, has lack of reliable transportation kept you from medical appointments, meetings, work or from getting things needed for daily living? : No  Physical Activity: Sufficiently Active (12/18/2022)   Exercise Vital Sign    Days of Exercise per Week: 6 days    Minutes of Exercise per Session: 30 min  Stress: No Stress Concern Present (12/18/2022)   [employer] of Occupational Health - Occupational Stress Questionnaire    Feeling of Stress : Not at all  Social Connections: Unknown (06/26/2021)   Received from [facility]   Social Network    Social Network: Not on file  Intimate Partner Violence: Not At Risk (12/18/2022)   Humiliation, Afraid, Rape, and Kick questionnaire    Fear of Current or Ex-Partner: No    Emotionally Abused: No    Physically Abused: No    Sexually Abused: No      Review of systems: All other review of systems negative except as mentioned in the HPI.   Physical Exam: Vitals:   11/17/23 1428  BP: 136/80  Pulse: 72   Body mass index is 23.08 kg/m. Gen:      No acute distress HEENT:  sclera anicteric CV: s1s2 rrr, no murmur Lungs: B/l clear. Abd:      soft, non-tender; no palpable masses, no distension Ext:    No edema Neuro: alert and oriented x 3 Psych: normal mood and affect  Data Reviewed:  Reviewed labs, radiology imaging, old records and pertinent past GI work up   Assessment and Plan Assessment & Plan Liver enzyme elevation likely due to alcohol use Mild elevation in AST, likely due to  alcohol consumption. Discussed the impact of alcohol on liver health, including potential for inflammation, fibrosis, and cirrhosis. Emphasized the  reversible nature of liver damage with lifestyle changes. - Abstain from alcohol for three months to allow liver enzymes to normalize. - Recheck liver function tests in three months. - Discussed limiting alcohol intake to social occasions after liver enzymes normalize.  Pancreatic cyst under surveillance Pancreatic cyst has been stable for two years, measuring less than one centimeter. Low risk for malignancy given stability and size. Discussed the limitations of current screening methods for pancreatic cancer and the low probability of malignancy given current findings. She expressed concern due to family history of pancreatic cancer. - Order MRCP to evaluate, surveillance of pancreatic cyst.   Lactose intolerance Symptoms of urgency and gastrointestinal discomfort associated with milk consumption, suggestive of lactose intolerance. She has been avoiding milk and dairy products, which may contribute to weight loss. - Recommend trying lactose-free milk, such as Fairlife, to manage symptoms. - Avoid plant-based milk alternatives.  Abdominal bloating Mild abdominal bloating possibly related to dietary changes and lactose intolerance. No significant findings on physical examination to suggest acute pathology. - Monitor symptoms and dietary triggers.  Unintentional weight loss associated with bereavement Weight loss noted since the passing of her mother, likely related to bereavement and dietary changes. No significant concern for malignancy given stable pancreatic cyst and lack of other alarming symptoms. - Encourage increased protein intake through lentils, beans, and other non-meat sources. - Reinforce the importance of regular meals, including breakfast.  Return in 3 months      This visit required >40 minutes of patient care (this includes precharting, chart review, review of results, face-to-face time used for counseling as well as treatment plan and follow-up. The patient was  provided an opportunity to ask questions and all were answered. The patient agreed with the plan and demonstrated an understanding of the instructions.  LOIS Wilkie Mcgee , MD    CC: Randol Dawes, MD

## 2023-11-17 NOTE — Patient Instructions (Addendum)
 VISIT SUMMARY:  Today, we discussed your pancreatic cyst, weight loss, gastrointestinal symptoms, and liver health. We reviewed your recent imaging and lab results, and made plans for further monitoring and lifestyle changes.  YOUR PLAN:  LIVER ENZYME ELEVATION: Your liver enzymes are slightly elevated, likely due to your alcohol consumption. -Abstain from alcohol for three months to allow liver enzymes to normalize. -Recheck liver function tests in three months. -Limit alcohol intake to social occasions after liver enzymes normalize.  PANCREATIC CYST: Your pancreatic cyst has been stable and is low risk for malignancy, but we will continue to monitor it due to your family history. -Order MRCP to evaluate pancreatic cyst. -Schedule MRI at [facility] for continuity of care.  LACTOSE INTOLERANCE: You have symptoms of gastrointestinal discomfort with milk consumption, likely due to lactose intolerance. -Try lactose-free milk, such as Fairlife, to manage symptoms. -Avoid plant-based milk alternatives.  ABDOMINAL BLOATING: You have mild abdominal bloating, possibly related to dietary changes and lactose intolerance. -Monitor symptoms and dietary triggers.  UNINTENTIONAL WEIGHT LOSS: Your weight loss is likely related to bereavement and dietary changes. -Increase protein intake through lentils, beans, and other non-meat sources. -Maintain regular meals, including breakfast.   You will be contacted by [facility] Scheduling in the next 2 days to arrange a MRI/MRCP.  The number on your caller ID will be [phone], please answer when they call.  If you have not heard from them in 2 days please call [phone] to schedule.    _______________________________________________________  If your blood pressure at your visit was 140/90 or greater, please contact your primary care physician to follow up on this.  _______________________________________________________  If you are age  60 or older, your body mass index should be between 23-30. Your Body mass index is 23.08 kg/m. If this is out of the aforementioned range listed, please consider follow up with your Primary Care Provider.  If you are age 72 or younger, your body mass index should be between 19-25. Your Body mass index is 23.08 kg/m. If this is out of the aformentioned range listed, please consider follow up with your Primary Care Provider.   ________________________________________________________  The [facility] providers would like to encourage you to use MYCHART to communicate with providers for non-urgent requests or questions.  Due to long hold times on the telephone, sending your provider a message by [facility] may be a faster and more efficient way to get a response.  Please allow 48 business hours for a response.  Please remember that this is for non-urgent requests.  _______________________________________________________  [facility] is using a team-based approach to care.  Your team is made up of your doctor and two to three APPS. Our APPS (Nurse Practitioners and Physician Assistants) work with your physician to ensure care continuity for you. They are fully qualified to address your health concerns and develop a treatment plan. They communicate directly with your gastroenterologist to care for you. Seeing the Advanced Practice Practitioners on your physician's team can help you by facilitating care more promptly, often allowing for earlier appointments, access to diagnostic testing, procedures, and other specialty referrals.   Due to recent changes in healthcare laws, you may see the results of your imaging and laboratory studies on MyChart before your provider has had a chance to review them.  We understand that in some cases there may be results that are confusing or concerning to you. Not all laboratory results come back in the same time frame and the provider  may be waiting for multiple  results in order to interpret others.  Please give us  48 hours in order for your provider to thoroughly review all the results before contacting the office for clarification of your results.   Thank you for choosing me and [facility].  Dr. Desirey Keahey

## 2023-11-27 ENCOUNTER — Other Ambulatory Visit (HOSPITAL_BASED_OUTPATIENT_CLINIC_OR_DEPARTMENT_OTHER): Payer: Self-pay

## 2023-11-27 MED ORDER — FLUZONE 0.5 ML IM SUSY
0.5000 mL | PREFILLED_SYRINGE | Freq: Once | INTRAMUSCULAR | 0 refills | Status: AC
  Filled 2023-11-27: qty 0.5, 1d supply, fill #0

## 2023-12-08 ENCOUNTER — Ambulatory Visit (HOSPITAL_COMMUNITY)

## 2023-12-15 ENCOUNTER — Ambulatory Visit (HOSPITAL_COMMUNITY)
Admission: RE | Admit: 2023-12-15 | Discharge: 2023-12-15 | Disposition: A | Discharge status: Home / Self Care | Source: Ambulatory Visit | Attending: Gastroenterology | Admitting: Gastroenterology

## 2023-12-15 ENCOUNTER — Other Ambulatory Visit: Payer: Self-pay | Admitting: Gastroenterology

## 2023-12-15 ENCOUNTER — Encounter (HOSPITAL_COMMUNITY): Payer: Self-pay

## 2023-12-15 DIAGNOSIS — K862 Cyst of pancreas: Secondary | ICD-10-CM

## 2023-12-15 DIAGNOSIS — R7401 Elevation of levels of liver transaminase levels: Secondary | ICD-10-CM

## 2023-12-15 DIAGNOSIS — K8500 Idiopathic acute pancreatitis without necrosis or infection: Secondary | ICD-10-CM

## 2023-12-15 DIAGNOSIS — K769 Liver disease, unspecified: Secondary | ICD-10-CM

## 2023-12-18 ENCOUNTER — Encounter: Payer: Self-pay | Admitting: Gastroenterology

## 2023-12-18 DIAGNOSIS — K862 Cyst of pancreas: Secondary | ICD-10-CM

## 2023-12-21 NOTE — Progress Notes (Unsigned)
 No chief complaint on file.  Deborah Meadows is a 58 y.o. female who presents for a complete physical.   She has the following concerns:  She was last seen by me in late July with complaints of tachycardia/palpitations, feeling like her heart was vibrating.  She had normal echo (EF 55-60%, grade 1 DD). She had declined Zio patch. TSH, cbc and B12 were normal.  Chem panel notable for mild LFT elevations. See below. She was referred to cardiologist at [facility], per her request. She has seen Dr. Roselind twice, and had heart monitor through their office.  Per his notes--  There were multiple triggers with no consistent correlation with a specific arrhythmia. Some of the triggers showed isolated atrial ectopy. Overall benign monitor with no significant arrhythmias.  Beta blocker was suggested/offered, but declined.  At July visit  with me she endorsed having some stress and irritability at that time (mother had died 7 months prior, father had new girlfriend, he had surgery, complications, issues with [facility]).   We had discussed her anxiety/irritability.  She declined SSRI/SNRI, was willing to consider Buspar .  Prescribed 15mg  tablet and advised on how to titrate. Encouraged grief counseling as well.  ***ever try?     At July visit she also reported wanting the thyroid  cyst drained. It wasn't painful or changing, just wanted it gone. In checking with radiology, we were told that she needed new US , needed to be done as separate visits. 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 TI-RADS category 1. This nodule does NOT meet TI-RADS criteria for biopsy or dedicated follow-up.   Additional tiny subcentimeter cysts and nodules also noted bilaterally. None meet criteria to  warrant biopsy or imaging surveillance.   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 No new nodules or suspicious features.  We were told to order FNA/biopsy thyroid  (when asked how to order for aspiration of the cyst), which we did, but she hasn't had procedure done.  ***   She has been under the care of Dr. 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  Her mother passed away from pancreatic cancer. MRI was ordered and attempted, but patient as too claustrophobic.  She is scheduled for CT of pancreas on 11/13.  She has had some elevations in LFT's, which had improved some on last check with GI in September. She has been encouraged to cut back on wine intake, as intake had been 2 glasses of wine daily. Currently she reports drinking ***  Component Ref Range & Units (hover) 1 mo ago (11/07/23) 3 mo ago (09/11/23) 11 mo ago (01/13/23) 1 yr ago (12/18/22) 2 yr ago (11/22/21) 2 yr ago (06/22/21) 3 yr ago (11/14/20)  Total Bilirubin 1.0 0.3 R 0.7 0.6 R 0.3 R 0.7 R 0.3 R  Bilirubin, Direct 0.2  0.2      Alkaline Phosphatase 49 71 R 51 83 R 79 R 55 R 68 R  AST 60 High  72 High  R 40 High  144 High  R 37 R 36 R 45 High  R  ALT 28 40 High  R 35 86 High  R 21 R 18 R 23 R  Total Protein 7.4 7.0 R 7.1 7.0 R 7.1 R 7.4 R 6.8 R  Albumin 4.7 4.6 R 4.3 4.6 R 4.8 R 4.9 R 4.5 R    She has h/o erosive gastritis, gastric ulcer, noted on EGD 02/2022.  She was treated with rx-strength PPI x 3 mos (and carafate ), then tapered off. She denies any abdominal pain, heartburn.  Still having bloating?    White coat HTN--her wrist BP monitor has been verified as accurate (last in 07/2021).   BP's at home are ***   Hypercholesterolemia--she has had excellent HDL.  Her last LDL was 175 in 12/2022.  At that time she reported eating 3-4 eggs/week (no longer just eating egg whites), some mayo on tunafish, some cheese on salads, occasional cheesy casserole. 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  She had calcium  score of 0 in 12/2023. She has discussed her last lipids and calcium  score with the cardiologist, who agrees that statins are not indicated at this time.  Lab Results  Component Value Date   CHOL 285 (H) 12/18/2022   HDL 101 12/18/2022   LDLCALC 175 (H) 12/18/2022   TRIG 60 12/18/2022   CHOLHDL 2.8 12/18/2022    Vitamin D  deficiency:  Last level was 63.6 in 11/2020, when taking 4000 IU 3x/week. Currently taking 2000 IU daily.  She drinks 2 cups of skim Fairlife milk daily.  Component Ref Range & Units (hover) 3 yr ago 4 yr ago 5 yr ago 6 yr ago 7 yr ago 8 yr ago  Vit D, 25-Hydroxy 63.6 45.5 CM 52.7 CM 37.3 CM 31 R, CM 26 Low  R, CM    Immunization History  Administered Date(s) Administered   Influenza Split 11/01/2010   Influenza, Seasonal, Injecte, Preservative Fre 11/27/2023   Influenza,inj,Quad PF,6+ Mos 11/02/2012, 10/05/2015, 10/09/2016, 10/15/2017, 10/28/2018, 11/11/2019, 11/13/2020, 11/22/2021   Influenza-Unspecified 12/03/2022   PFIZER(Purple Top)SARS-COV-2 Vaccination 05/26/2019, 06/16/2019   Pfizer Covid-19 Vaccine Bivalent Booster 56yrs & up 10/19/2020   Pfizer(Comirnaty )Fall Seasonal Vaccine 12 years and older 11/22/2022   Tdap 11/02/2012, 12/18/2022   Last Pap smear: 02/2021, normal, negative high risk HPV (through GYN) Last mammogram: 06/2023 Last colonoscopy: 03/2019--diverticulosis, small internal hemorrhoids.  No polyps.  Repeat 5 years (had 13mm tubular adenoma in 02/2016). Last DEXA: never  Dentist:  twice yearly Ophtho: yearly Exercise:    [place] app averaging 30 mins day, 7 days/week. Does full body strength--strength and cardio.  strolls with her dogs every night. Still lifts 30-50# at work (bags of flour, cases of butter,  etc).   PMH, PSH, SH and FH were reviewed and updated     ROS:  The patient denies anorexia, fever, weight changes, headaches, vision changes, decreased hearing, ear pain, sore throat, breast concerns, dizziness, syncope, dyspnea on exertion, cough, swelling, nausea, vomiting, constipation, abdominal pain, melena, hematochezia, indigestion/heartburn, hematuria, incontinence, dysuria, vaginal bleeding, discharge, odor or itch, genital lesions, joint pains, numbness, tingling, weakness, tremor, suspicious skin lesions, depression, anxiety, abnormal bleeding/bruising, or enlarged lymph nodes.  Postmenopausal.  Only mild hot flashes. No sweats. Some chronic postnasal drainage. Eczema--occasionally flares behind her ears, and on neck, uses steroid cream prn. Sees derm for skin checks. No change to thyroid  cyst Bowels are normal. Abdominal bloating, chronic. No vertigo in over a year. Tachycardia/palpitations *** Weight loss after her mother's death, has been stable.  PHYSICAL EXAM:  LMP 07/04/2015   Wt Readings from  Last 3 Encounters:  11/17/23 129 lb 4 oz (58.6 kg)  09/25/23 126 lb (57.2 kg)  09/11/23 128 lb 9.6 oz (58.3 kg)   12/18/22 140 lb 9.6 oz (63.8 kg)   General Appearance:    Alert, cooperative, no distress, appears stated age. She declined getting changed into gown today (sees GYN, derm)  Head:    Normocephalic, without obvious abnormality, atraumatic  Eyes:   PERRL, conjunctiva/corneas clear, EOM's intact, fundi benign  Ears:    Normal TM's and external ear canals.  Nose:   Normal, no drainage or sinus tenderness  Throat:   Normal, no lesions  Neck:  Supple, no lymphadenopathy; thyroid : nontender. There is a 1-1.5cm soft, superficial, mobile mass just to the left of midline over thyroid , unchanged from prior visit. No carotid bruit or JVD  Back:    Spine nontender, no curvature, ROM normal, no CVA  tenderness  Lungs:   Clear to auscultation bilaterally without wheezes,  rales or ronchi;  respirations unlabored  Chest Wall:    No tenderness or deformity   Heart:    Regular rate and rhythm, S1 and S2 normal, no murmur, rub or gallop.  Breast Exam:    Deferred to GYN  Abdomen:     Soft, non-tender, nondistended, normoactive bowel sounds, no masses, no hepatosplenomegaly  Genitalia:    Deferred to GYN       Extremities:   No clubbing, cyanosis or edema.  Pulses:   2+ and symmetric all extremities  Skin:   Skin color, texture, turgor normal. Exam limited (not changed into gown).  Lymph nodes:   Cervical, supraclavicular nodes normal  Neurologic:   Normal strength, sensation and gait; reflexes 2+ and symmetric throughout                 Psych:   Normal mood, affect, hygiene and grooming  ***UPDATE thyroid  cyst,  not in gown  ASSESSMENT/PLAN:  Phq-9 GAD-7 Did she ever start buspar ? (Discussed at 7/31 visit)  Does she want D rechecked? (Last checked in 2022 when taking 87999 IU/week)  Did she ever get shingrix from pharmacy?  If not, to get that as NV or from pharm 2 weeks separate from other vaccines  COVID and Prevnar-20 recommended.   Discussed monthly self breast exams and yearly mammograms; at least 30 minutes of aerobic activity at least 5 days/week, weight-bearing exercise at least 2x/week; proper sunscreen use reviewed; healthy diet, including goals of calcium  and vitamin D  intake and alcohol recommendations (less than or equal to 1 drink/day) reviewed; regular seatbelt use; changing batteries in smoke detectors, carbon monoxide detectors.  Immunization recommendations discussed--continue yearly flu shots.   COVID booster *** Prevnar-20 *** Shingrix discussed, encouraged to either schedule NV or get from pharmacy, series of 2 vaccines 2 months apart. Colonoscopy recommendations reviewed, UTD (due again 03/2024)  F/u 1 year, sooner prn.

## 2023-12-21 NOTE — Patient Instructions (Incomplete)
  HEALTH MAINTENANCE RECOMMENDATIONS:  It is recommended that you get at least 30 minutes of aerobic exercise at least 5 days/week (for weight loss, you may need as much as 60-90 minutes). This can be any activity that gets your heart rate up. This can be divided in 10-15 minute intervals if needed, but try and build up your endurance at least once a week.  Weight bearing exercise is also recommended twice weekly.  Eat a healthy diet with lots of vegetables, fruits and fiber.  Colorful foods have a lot of vitamins (ie green vegetables, tomatoes, red peppers, etc).  Limit sweet tea, regular sodas and alcoholic beverages, all of which has a lot of calories and sugar.  Up to 1 alcoholic drink daily may be beneficial for women (unless trying to lose weight, watch sugars).  Drink a lot of water.  Calcium  recommendations are 1200-1500 mg daily (1500 mg for postmenopausal women or women without ovaries), and vitamin D  1000 IU daily.  This should be obtained from diet and/or supplements (vitamins), and calcium  should not be taken all at once, but in divided doses.  Monthly self breast exams and yearly mammograms for women over the age of 62 is recommended.  Sunscreen of at least SPF 30 should be used on all sun-exposed parts of the skin when outside between the hours of 10 am and 4 pm (not just when at beach or pool, but even with exercise, golf, tennis, and yard work!)  Use a sunscreen that says broad spectrum so it covers both UVA and UVB rays, and make sure to reapply every 1-2 hours.  Remember to change the batteries in your smoke detectors when changing your clock times in the spring and fall. Carbon monoxide detectors are recommended for your home.  Use your seat belt every time you are in a car, and please drive safely and not be distracted with cell phones and texting while driving.  We discussed Prevnar-20 (or Capvaxive-21 as an alternative) as being recommended once (no boosters). You can get  Prevnar either at our office or the pharmacy.  Capvaxive is only at certain pharmacies. You need one or the other, not both.  Shingles vaccine is recommended--series of 2 vaccines given 2 months apart. Please schedule a nurse visit or get this from the pharmacy, when convenient (separated from other vaccines by 2 weeks).  Please send us  your blood pressure reading from today, at home, since your reading was high in the office (and have known white coat syndrome).  You need to get more calcium  from your diet. You can decide if you want to take calcium  supplements, or try switching to almond milk (which has 450 mg per 8 oz serving). We are checking your vitamin D  level today.  It is recommended that everyone get [phone] IU daily.

## 2023-12-22 ENCOUNTER — Encounter: Payer: Self-pay | Admitting: Family Medicine

## 2023-12-22 ENCOUNTER — Ambulatory Visit: Payer: BC Managed Care – PPO | Admitting: Family Medicine

## 2023-12-22 VITALS — BP 128/86 | HR 76 | Ht 63.0 in | Wt 125.0 lb

## 2023-12-22 DIAGNOSIS — E041 Nontoxic single thyroid nodule: Secondary | ICD-10-CM

## 2023-12-22 DIAGNOSIS — Z23 Encounter for immunization: Secondary | ICD-10-CM

## 2023-12-22 DIAGNOSIS — Z0000 Encounter for general adult medical examination without abnormal findings: Secondary | ICD-10-CM | POA: Diagnosis not present

## 2023-12-22 DIAGNOSIS — R7989 Other specified abnormal findings of blood chemistry: Secondary | ICD-10-CM

## 2023-12-22 DIAGNOSIS — E7800 Pure hypercholesterolemia, unspecified: Secondary | ICD-10-CM

## 2023-12-22 DIAGNOSIS — K862 Cyst of pancreas: Secondary | ICD-10-CM | POA: Diagnosis not present

## 2023-12-23 ENCOUNTER — Ambulatory Visit: Payer: Self-pay | Admitting: Family Medicine

## 2023-12-23 LAB — VITAMIN D 25 HYDROXY (VIT D DEFICIENCY, FRACTURES): Vit D, 25-Hydroxy: 42 ng/mL (ref 30.0–100.0)

## 2023-12-23 LAB — LIPID PANEL
Chol/HDL Ratio: 2.2 ratio (ref 0.0–4.4)
Cholesterol, Total: 315 mg/dL — ABNORMAL HIGH (ref 100–199)
HDL: 146 mg/dL (ref >39)
LDL Chol Calc (NIH): 163 mg/dL — ABNORMAL HIGH (ref 0–99)
Triglycerides: 48 mg/dL (ref 0–149)
VLDL Cholesterol Cal: 6 mg/dL (ref 5–40)

## 2023-12-25 ENCOUNTER — Ambulatory Visit (HOSPITAL_COMMUNITY)
Admission: RE | Admit: 2023-12-25 | Discharge: 2023-12-25 | Disposition: A | Discharge status: Home / Self Care | Source: Ambulatory Visit | Attending: Gastroenterology | Admitting: Gastroenterology

## 2023-12-25 DIAGNOSIS — K862 Cyst of pancreas: Secondary | ICD-10-CM | POA: Insufficient documentation

## 2023-12-25 MED ORDER — IOHEXOL 300 MG/ML  SOLN
100.0000 mL | Freq: Once | INTRAMUSCULAR | Status: AC | PRN
  Administered 2023-12-25: 100 mL via INTRAVENOUS

## 2023-12-25 MED ORDER — SODIUM CHLORIDE (PF) 0.9 % IJ SOLN
INTRAMUSCULAR | Status: AC
  Filled 2023-12-25: qty 50

## 2023-12-30 ENCOUNTER — Ambulatory Visit: Payer: Self-pay | Admitting: Gastroenterology

## 2024-09-28 ENCOUNTER — Ambulatory Visit: Admitting: Nurse Practitioner

## 2025-01-12 ENCOUNTER — Encounter: Admitting: Family Medicine
